# Patient Record
Sex: Female | Born: 1935 | Race: White | Hispanic: No | Smoking: Former smoker
Health system: Southern US, Community
[De-identification: ages and names within clinical notes are randomized; demographics above are authoritative.]

## PROBLEM LIST (undated history)

## (undated) DIAGNOSIS — G43909 Migraine, unspecified, not intractable, without status migrainosus: Secondary | ICD-10-CM

## (undated) DIAGNOSIS — K5792 Diverticulitis of intestine, part unspecified, without perforation or abscess without bleeding: Secondary | ICD-10-CM

## (undated) DIAGNOSIS — K219 Gastro-esophageal reflux disease without esophagitis: Secondary | ICD-10-CM

## (undated) DIAGNOSIS — I639 Cerebral infarction, unspecified: Secondary | ICD-10-CM

## (undated) DIAGNOSIS — R011 Cardiac murmur, unspecified: Secondary | ICD-10-CM

## (undated) DIAGNOSIS — E7800 Pure hypercholesterolemia, unspecified: Secondary | ICD-10-CM

## (undated) DIAGNOSIS — H532 Diplopia: Secondary | ICD-10-CM

## (undated) DIAGNOSIS — IMO0002 Reserved for concepts with insufficient information to code with codable children: Secondary | ICD-10-CM

## (undated) DIAGNOSIS — I10 Essential (primary) hypertension: Secondary | ICD-10-CM

## (undated) DIAGNOSIS — J40 Bronchitis, not specified as acute or chronic: Secondary | ICD-10-CM

## (undated) DIAGNOSIS — M1990 Unspecified osteoarthritis, unspecified site: Secondary | ICD-10-CM

## (undated) DIAGNOSIS — D759 Disease of blood and blood-forming organs, unspecified: Secondary | ICD-10-CM

## (undated) HISTORY — PX: JOINT REPLACEMENT: SHX530

## (undated) HISTORY — PX: TONSILLECTOMY: SUR1361

## (undated) HISTORY — PX: CARPAL TUNNEL RELEASE: SHX101

---

## 1996-11-28 HISTORY — PX: TOTAL HIP ARTHROPLASTY: SHX124

## 1998-10-30 ENCOUNTER — Ambulatory Visit (HOSPITAL_COMMUNITY): Admission: RE | Admit: 1998-10-30 | Discharge: 1998-10-30 | Payer: Self-pay | Admitting: Obstetrics & Gynecology

## 1998-11-05 ENCOUNTER — Ambulatory Visit (HOSPITAL_COMMUNITY): Admission: RE | Admit: 1998-11-05 | Discharge: 1998-11-05 | Payer: Self-pay | Admitting: Endocrinology

## 1999-11-05 ENCOUNTER — Ambulatory Visit (HOSPITAL_COMMUNITY): Admission: RE | Admit: 1999-11-05 | Discharge: 1999-11-05 | Payer: Self-pay | Admitting: *Deleted

## 1999-11-15 ENCOUNTER — Ambulatory Visit (HOSPITAL_COMMUNITY): Admission: RE | Admit: 1999-11-15 | Discharge: 1999-11-15 | Payer: Self-pay | Admitting: Obstetrics & Gynecology

## 1999-11-15 ENCOUNTER — Encounter: Payer: Self-pay | Admitting: *Deleted

## 1999-11-18 ENCOUNTER — Ambulatory Visit (HOSPITAL_COMMUNITY): Admission: RE | Admit: 1999-11-18 | Discharge: 1999-11-18 | Payer: Self-pay | Admitting: *Deleted

## 1999-11-18 ENCOUNTER — Encounter: Payer: Self-pay | Admitting: *Deleted

## 2000-11-03 ENCOUNTER — Ambulatory Visit (HOSPITAL_COMMUNITY): Admission: RE | Admit: 2000-11-03 | Discharge: 2000-11-03 | Payer: Self-pay | Admitting: Obstetrics & Gynecology

## 2000-11-23 ENCOUNTER — Ambulatory Visit (HOSPITAL_COMMUNITY): Admission: RE | Admit: 2000-11-23 | Discharge: 2000-11-23 | Payer: Self-pay | Admitting: Obstetrics & Gynecology

## 2001-11-28 HISTORY — PX: CATARACT EXTRACTION W/ INTRAOCULAR LENS IMPLANT: SHX1309

## 2002-11-28 DIAGNOSIS — K5792 Diverticulitis of intestine, part unspecified, without perforation or abscess without bleeding: Secondary | ICD-10-CM

## 2002-11-28 HISTORY — DX: Diverticulitis of intestine, part unspecified, without perforation or abscess without bleeding: K57.92

## 2003-11-29 HISTORY — PX: TOTAL KNEE ARTHROPLASTY: SHX125

## 2004-01-05 ENCOUNTER — Other Ambulatory Visit: Payer: Self-pay

## 2004-11-28 HISTORY — PX: CATARACT EXTRACTION W/ INTRAOCULAR LENS IMPLANT: SHX1309

## 2005-07-18 ENCOUNTER — Ambulatory Visit: Payer: Self-pay | Admitting: Ophthalmology

## 2005-07-25 ENCOUNTER — Ambulatory Visit: Payer: Self-pay | Admitting: Ophthalmology

## 2006-09-24 ENCOUNTER — Emergency Department: Payer: Self-pay | Admitting: Emergency Medicine

## 2008-01-11 ENCOUNTER — Ambulatory Visit: Payer: Self-pay | Admitting: Endocrinology

## 2010-11-28 DIAGNOSIS — H532 Diplopia: Secondary | ICD-10-CM

## 2010-11-28 HISTORY — DX: Diplopia: H53.2

## 2011-02-21 ENCOUNTER — Emergency Department: Payer: Self-pay | Admitting: Emergency Medicine

## 2013-11-28 DIAGNOSIS — J40 Bronchitis, not specified as acute or chronic: Secondary | ICD-10-CM

## 2013-11-28 HISTORY — DX: Bronchitis, not specified as acute or chronic: J40

## 2014-07-16 ENCOUNTER — Other Ambulatory Visit: Payer: Self-pay | Admitting: Orthopedic Surgery

## 2014-07-16 DIAGNOSIS — M25511 Pain in right shoulder: Secondary | ICD-10-CM

## 2014-07-18 ENCOUNTER — Ambulatory Visit
Admission: RE | Admit: 2014-07-18 | Discharge: 2014-07-18 | Disposition: A | Discharge status: Home / Self Care | Payer: Medicare Other | Source: Ambulatory Visit | Attending: Orthopedic Surgery | Admitting: Orthopedic Surgery

## 2014-07-18 DIAGNOSIS — M25511 Pain in right shoulder: Secondary | ICD-10-CM

## 2014-07-22 ENCOUNTER — Encounter (HOSPITAL_COMMUNITY): Payer: Self-pay

## 2014-07-24 NOTE — Pre-Procedure Instructions (Signed)
Debbie Foster  07/24/2014   Your procedure is scheduled on:  Tues, Sept 8 @ 7:30 AM  Report to [facility] at 5:30 AM.  Call this number if you have problems the morning of surgery: [phone]   Remember:   Do not eat food or drink liquids after midnight.   Take these medicines the morning of surgery with A SIP OF WATER: Metoprolol(Toprol)              Stop taking your Plavix and Aspirin as you have been instructed. Also stop taking your Fish Oil. No Goody's,BC's,Aleve,Ibuprofen,or any Herbal Medications   Do not wear jewelry, make-up or nail polish.  Do not wear lotions, powders, or perfumes.   Do not shave 48 hours prior to surgery.   Do not bring valuables to the hospital.  [facility] is not responsible                  for any belongings or valuables.               Contacts, dentures or bridgework may not be worn into surgery.  Leave suitcase in the car. After surgery it may be brought to your room.  For patients admitted to the hospital, discharge time is determined by your                treatment team.               Special Instructions:  [facility] - Preparing for Surgery  Before surgery, you can play an important role.  Because skin is not sterile, your skin needs to be as free of germs as possible.  You can reduce the number of germs on you skin by washing with CHG (chlorahexidine gluconate) soap before surgery.  CHG is an antiseptic cleaner which kills germs and bonds with the skin to continue killing germs even after washing.  Please DO NOT use if you have an allergy to CHG or antibacterial soaps.  If your skin becomes reddened/irritated stop using the CHG and inform your nurse when you arrive at Short Stay.  Do not shave (including legs and underarms) for at least 48 hours prior to the first CHG shower.  You may shave your face.  Please follow these instructions carefully:   1.  Shower with CHG Soap the night before surgery and the                                 morning of Surgery.  2.  If you choose to wash your hair, wash your hair first as usual with your       normal shampoo.  3.  After you shampoo, rinse your hair and body thoroughly to remove the                      Shampoo.  4.  Use CHG as you would any other liquid soap.  You can apply chg directly       to the skin and wash gently with scrungie or a clean washcloth.  5.  Apply the CHG Soap to your body ONLY FROM THE NECK DOWN.        Do not use on open wounds or open sores.  Avoid contact with your eyes,       ears, mouth and genitals (private parts).  Wash genitals (private parts)  with your normal soap.  6.  Wash thoroughly, paying special attention to the area where your surgery        will be performed.  7.  Thoroughly rinse your body with warm water from the neck down.  8.  DO NOT shower/wash with your normal soap after using and rinsing off       the CHG Soap.  9.  Pat yourself dry with a clean towel.            10.  Wear clean pajamas.            11.  Place clean sheets on your bed the night of your first shower and do not        sleep with pets.  Day of Surgery  Do not apply any lotions/deoderants the morning of surgery.  Please wear clean clothes to the hospital/surgery center.     Please read over the following fact sheets that you were given: Pain Booklet, Coughing and Deep Breathing, Blood Transfusion Information and Surgical Site Infection Prevention

## 2014-07-25 ENCOUNTER — Encounter (HOSPITAL_COMMUNITY)
Admission: RE | Admit: 2014-07-25 | Discharge: 2014-07-25 | Disposition: A | Discharge status: Home / Self Care | Payer: Medicare Other | Source: Ambulatory Visit | Attending: Orthopedic Surgery | Admitting: Orthopedic Surgery

## 2014-07-25 ENCOUNTER — Encounter (HOSPITAL_COMMUNITY): Payer: Self-pay

## 2014-07-25 DIAGNOSIS — M719 Bursopathy, unspecified: Secondary | ICD-10-CM | POA: Diagnosis present

## 2014-07-25 DIAGNOSIS — M19019 Primary osteoarthritis, unspecified shoulder: Secondary | ICD-10-CM | POA: Insufficient documentation

## 2014-07-25 DIAGNOSIS — Z01818 Encounter for other preprocedural examination: Secondary | ICD-10-CM | POA: Insufficient documentation

## 2014-07-25 DIAGNOSIS — M67919 Unspecified disorder of synovium and tendon, unspecified shoulder: Secondary | ICD-10-CM | POA: Insufficient documentation

## 2014-07-25 HISTORY — DX: Cardiac murmur, unspecified: R01.1

## 2014-07-25 HISTORY — DX: Gastro-esophageal reflux disease without esophagitis: K21.9

## 2014-07-25 HISTORY — DX: Essential (primary) hypertension: I10

## 2014-07-25 HISTORY — DX: Cerebral infarction, unspecified: I63.9

## 2014-07-25 HISTORY — DX: Disease of blood and blood-forming organs, unspecified: D75.9

## 2014-07-25 HISTORY — DX: Unspecified osteoarthritis, unspecified site: M19.90

## 2014-07-25 HISTORY — DX: Bronchitis, not specified as acute or chronic: J40

## 2014-07-25 HISTORY — DX: Diverticulitis of intestine, part unspecified, without perforation or abscess without bleeding: K57.92

## 2014-07-25 LAB — COMPREHENSIVE METABOLIC PANEL
ALBUMIN: 4.3 g/dL (ref 3.5–5.2)
ALK PHOS: 74 U/L (ref 39–117)
ALT: 21 U/L (ref 0–35)
AST: 26 U/L (ref 0–37)
Anion gap: 16 — ABNORMAL HIGH (ref 5–15)
BUN: 24 mg/dL — AB (ref 6–23)
CO2: 22 mEq/L (ref 19–32)
CREATININE: 0.94 mg/dL (ref 0.50–1.10)
Calcium: 10.9 mg/dL — ABNORMAL HIGH (ref 8.4–10.5)
Chloride: 94 mEq/L — ABNORMAL LOW (ref 96–112)
GFR calc non Af Amer: 53 mL/min — ABNORMAL LOW (ref >90)
GFR, EST AFRICAN AMERICAN: 61 mL/min — AB (ref >90)
GLUCOSE: 107 mg/dL — AB (ref 70–99)
POTASSIUM: 4.1 meq/L (ref 3.7–5.3)
Sodium: 132 mEq/L — ABNORMAL LOW (ref 137–147)
Total Bilirubin: 0.3 mg/dL (ref 0.3–1.2)
Total Protein: 8.1 g/dL (ref 6.0–8.3)

## 2014-07-25 LAB — APTT: aPTT: 32 seconds (ref 24–37)

## 2014-07-25 LAB — URINALYSIS, ROUTINE W REFLEX MICROSCOPIC
BILIRUBIN URINE: NEGATIVE
GLUCOSE, UA: NEGATIVE mg/dL
Hgb urine dipstick: NEGATIVE
Ketones, ur: NEGATIVE mg/dL
Nitrite: NEGATIVE
Protein, ur: NEGATIVE mg/dL
SPECIFIC GRAVITY, URINE: 1.012 (ref 1.005–1.030)
Urobilinogen, UA: 0.2 mg/dL (ref 0.0–1.0)
pH: 5.5 (ref 5.0–8.0)

## 2014-07-25 LAB — CBC WITH DIFFERENTIAL/PLATELET
BASOS PCT: 1 % (ref 0–1)
Basophils Absolute: 0.1 10*3/uL (ref 0.0–0.1)
EOS ABS: 0.3 10*3/uL (ref 0.0–0.7)
Eosinophils Relative: 2 % (ref 0–5)
HEMATOCRIT: 40.1 % (ref 36.0–46.0)
HEMOGLOBIN: 13.4 g/dL (ref 12.0–15.0)
LYMPHS ABS: 2.7 10*3/uL (ref 0.7–4.0)
Lymphocytes Relative: 24 % (ref 12–46)
MCH: 29.8 pg (ref 26.0–34.0)
MCHC: 33.4 g/dL (ref 30.0–36.0)
MCV: 89.3 fL (ref 78.0–100.0)
MONO ABS: 1 10*3/uL (ref 0.1–1.0)
MONOS PCT: 9 % (ref 3–12)
NEUTROS PCT: 64 % (ref 43–77)
Neutro Abs: 7.2 10*3/uL (ref 1.7–7.7)
Platelets: 295 10*3/uL (ref 150–400)
RBC: 4.49 MIL/uL (ref 3.87–5.11)
RDW: 12.8 % (ref 11.5–15.5)
WBC: 11.2 10*3/uL — ABNORMAL HIGH (ref 4.0–10.5)

## 2014-07-25 LAB — TYPE AND SCREEN
ABO/RH(D): B POS
Antibody Screen: NEGATIVE

## 2014-07-25 LAB — PROTIME-INR
INR: 1.02 (ref 0.00–1.49)
Prothrombin Time: 13.4 seconds (ref 11.6–15.2)

## 2014-07-25 LAB — URINE MICROSCOPIC-ADD ON

## 2014-07-25 LAB — ABO/RH: ABO/RH(D): B POS

## 2014-07-25 NOTE — Progress Notes (Signed)
Spoke to [place] at Dr Veda Canning office concerning surgical clearance, she stated that patient has an appointment 07/28/14 for clearance and to address Plavix. Darl Pikes will fax to [facility] for anesthesia.

## 2014-07-25 NOTE — Pre-Procedure Instructions (Signed)
Debbie Foster  07/25/2014   Your procedure is scheduled on:  Tues, Sept 8 @ 7:30 AM  Report to [facility] at 5:30 AM.  Call this number if you have problems the morning of surgery: [phone]   Remember:   Do not eat food or drink liquids after midnight.   Take these medicines the morning of surgery with A SIP OF WATER: Metoprolol(Toprol)              Stop taking your Plavix and Aspirin as you have been instructed. Also stop taking your Fish Oil. No Goody's,BC's,Aleve,Ibuprofen,or any Herbal Medications  MD will address Plavix on 07/28/14 and give clearance for surgery   Do not wear jewelry, make-up or nail polish.  Do not wear lotions, powders, or perfumes.   Do not shave 48 hours prior to surgery.   Do not bring valuables to the hospital.  [facility] is not responsible                  for any belongings or valuables.               Contacts, dentures or bridgework may not be worn into surgery.  Leave suitcase in the car. After surgery it may be brought to your room.  For patients admitted to the hospital, discharge time is determined by your                treatment team.               Special Instructions:  [facility] - Preparing for Surgery  Before surgery, you can play an important role.  Because skin is not sterile, your skin needs to be as free of germs as possible.  You can reduce the number of germs on you skin by washing with CHG (chlorahexidine gluconate) soap before surgery.  CHG is an antiseptic cleaner which kills germs and bonds with the skin to continue killing germs even after washing.  Please DO NOT use if you have an allergy to CHG or antibacterial soaps.  If your skin becomes reddened/irritated stop using the CHG and inform your nurse when you arrive at Short Stay.  Do not shave (including legs and underarms) for at least 48 hours prior to the first CHG shower.  You may shave your face.  Please follow these instructions carefully:   1.  Shower with  CHG Soap the night before surgery and the                                morning of Surgery.  2.  If you choose to wash your hair, wash your hair first as usual with your       normal shampoo.  3.  After you shampoo, rinse your hair and body thoroughly to remove the                      Shampoo.  4.  Use CHG as you would any other liquid soap.  You can apply chg directly       to the skin and wash gently with scrungie or a clean washcloth.  5.  Apply the CHG Soap to your body ONLY FROM THE NECK DOWN.        Do not use on open wounds or open sores.  Avoid contact with your eyes,       ears,  mouth and genitals (private parts).  Wash genitals (private parts)       with your normal soap.  6.  Wash thoroughly, paying special attention to the area where your surgery        will be performed.  7.  Thoroughly rinse your body with warm water from the neck down.  8.  DO NOT shower/wash with your normal soap after using and rinsing off       the CHG Soap.  9.  Pat yourself dry with a clean towel.            10.  Wear clean pajamas.            11.  Place clean sheets on your bed the night of your first shower and do not        sleep with pets.  Day of Surgery  Do not apply any lotions/deoderants the morning of surgery.  Please wear clean clothes to the hospital/surgery center.     Please read over the following fact sheets that you were given: Pain Booklet, Coughing and Deep Breathing, Blood Transfusion Information and Surgical Site Infection Prevention

## 2014-07-28 ENCOUNTER — Encounter (HOSPITAL_COMMUNITY): Payer: Self-pay

## 2014-07-28 NOTE — Progress Notes (Addendum)
Anesthesia Chart Review:  Pt is 80 year old female posted for R reverse total shoulder arthoplasty on 08/05/14 by Dr. Ave Filter.   PMH: HTN, heart murmur (unknown type), stroke, arthritis, GERD, blood dyscrasia, squamous cell cancer.   Medications include: plavix, olmesartan/amlodipine/hctz, metoprolol, ASA.   Preoperative labs reviewed.    Chest x-ray reviewed. Chronic emphysematous changes.  Nodular opacity in the right mid lung, possibly related to the anterior right 4th rib, although indeterminate. Consider CT chest for further evaluation  EKG: NSR, possible LA enlargement.   Pt getting clearance today, 07/28/14, for surgery and instructions for managing plavix perioperatively. Notes will be faxed here for inclusion in chart.   Carla in Dr. Veda Canning office notified of CXR results.   Rica Mast, FNP-BC [facility] Stay Surgical Center/Anesthesiology Phone: [phone] 07/28/2014 1:54 PM  Addendum:  I called and spoke with patient. She say Dr. Patrecia Pace with [facility] and [facility] today.  She reports she was told to hold her ASA and Plavix. She was told in the past that she has a murmur, but says Dr. Patrecia Pace never expressed any concern over it. He increased her Toprol XL to 50 mg BID.  She was unclear whether or not she was suppose to hold Tribenzor while her Toprol XL was increased, so she was going to call and clarify that with him in the morning.  I did tell her that she should not take Tribenzor on the morning of surgery.    I also received a clearance note from Dr. Patrecia Pace this afternoon. According to his note, myocardial perfusion study on 09/15/11 showed: No ischemia, EF 59%. (Update 07/29/2014 10:22 AM: I did speak with Marcelino Duster at Dr. Gregery Na office to confirm that this procedure is typically done with general anesthesia and nerve block at our hospital. I did this since his clearance note is preprinted to read "monitored anesthesia care."  I  gave her my office phone number if Dr. Patrecia Pace had any additional questions or comments.)  [facility] Short Stay Center/Anesthesiology Phone [phone] 07/28/2014 6:19 PM

## 2014-08-04 MED ORDER — CEFAZOLIN SODIUM-DEXTROSE 2-3 GM-% IV SOLR
2.0000 g | INTRAVENOUS | Status: AC
  Administered 2014-08-05: 2 g via INTRAVENOUS
  Filled 2014-08-04: qty 50

## 2014-08-04 MED ORDER — POVIDONE-IODINE 7.5 % EX SOLN
Freq: Once | CUTANEOUS | Status: DC
  Filled 2014-08-04: qty 118

## 2014-08-05 ENCOUNTER — Inpatient Hospital Stay (HOSPITAL_COMMUNITY)
Admission: RE | Admit: 2014-08-05 | Discharge: 2014-08-06 | DRG: 483 | Disposition: A | Discharge status: Home / Self Care | Payer: Medicare Other | Source: Ambulatory Visit | Attending: Orthopedic Surgery | Admitting: Orthopedic Surgery

## 2014-08-05 ENCOUNTER — Encounter (HOSPITAL_COMMUNITY): Payer: Self-pay | Admitting: *Deleted

## 2014-08-05 ENCOUNTER — Inpatient Hospital Stay (HOSPITAL_COMMUNITY): Payer: Medicare Other | Admitting: Anesthesiology

## 2014-08-05 ENCOUNTER — Inpatient Hospital Stay (HOSPITAL_COMMUNITY): Payer: Medicare Other

## 2014-08-05 ENCOUNTER — Encounter (HOSPITAL_COMMUNITY): Payer: Medicare Other | Admitting: Emergency Medicine

## 2014-08-05 ENCOUNTER — Encounter (HOSPITAL_COMMUNITY)
Admission: RE | Disposition: A | Discharge status: Home / Self Care | Payer: Self-pay | Source: Ambulatory Visit | Attending: Orthopedic Surgery

## 2014-08-05 DIAGNOSIS — M19019 Primary osteoarthritis, unspecified shoulder: Secondary | ICD-10-CM | POA: Diagnosis not present

## 2014-08-05 DIAGNOSIS — I10 Essential (primary) hypertension: Secondary | ICD-10-CM | POA: Diagnosis present

## 2014-08-05 DIAGNOSIS — M12811 Other specific arthropathies, not elsewhere classified, right shoulder: Secondary | ICD-10-CM

## 2014-08-05 DIAGNOSIS — Z8673 Personal history of transient ischemic attack (TIA), and cerebral infarction without residual deficits: Secondary | ICD-10-CM | POA: Diagnosis not present

## 2014-08-05 DIAGNOSIS — M25519 Pain in unspecified shoulder: Secondary | ICD-10-CM | POA: Diagnosis present

## 2014-08-05 DIAGNOSIS — K219 Gastro-esophageal reflux disease without esophagitis: Secondary | ICD-10-CM | POA: Diagnosis present

## 2014-08-05 DIAGNOSIS — F17200 Nicotine dependence, unspecified, uncomplicated: Secondary | ICD-10-CM | POA: Diagnosis present

## 2014-08-05 DIAGNOSIS — M12819 Other specific arthropathies, not elsewhere classified, unspecified shoulder: Secondary | ICD-10-CM | POA: Diagnosis present

## 2014-08-05 DIAGNOSIS — Z7982 Long term (current) use of aspirin: Secondary | ICD-10-CM | POA: Diagnosis not present

## 2014-08-05 HISTORY — DX: Diplopia: H53.2

## 2014-08-05 HISTORY — DX: Reserved for concepts with insufficient information to code with codable children: IMO0002

## 2014-08-05 HISTORY — DX: Migraine, unspecified, not intractable, without status migrainosus: G43.909

## 2014-08-05 HISTORY — DX: Pure hypercholesterolemia, unspecified: E78.00

## 2014-08-05 HISTORY — PX: REVERSE SHOULDER ARTHROPLASTY: SHX5054

## 2014-08-05 SURGERY — ARTHROPLASTY, SHOULDER, TOTAL, REVERSE
Anesthesia: General | Laterality: Right

## 2014-08-05 MED ORDER — ONDANSETRON HCL 4 MG/2ML IJ SOLN: 4.0000 mg | Freq: Four times a day (QID) | INTRAMUSCULAR | Status: DC | PRN

## 2014-08-05 MED ORDER — OXYCODONE-ACETAMINOPHEN 5-325 MG PO TABS: 1.0000 | ORAL_TABLET | ORAL | Status: DC | PRN

## 2014-08-05 MED ORDER — ASPIRIN EC 325 MG PO TBEC
325.0000 mg | DELAYED_RELEASE_TABLET | Freq: Two times a day (BID) | ORAL | Status: DC
  Administered 2014-08-05 – 2014-08-06 (×2): 325 mg via ORAL
  Filled 2014-08-05 (×4): qty 1

## 2014-08-05 MED ORDER — ACETAMINOPHEN 650 MG RE SUPP: 650.0000 mg | Freq: Four times a day (QID) | RECTAL | Status: DC | PRN

## 2014-08-05 MED ORDER — GLYCOPYRROLATE 0.2 MG/ML IJ SOLN
INTRAMUSCULAR | Status: DC | PRN
  Administered 2014-08-05: 0.2 mg via INTRAVENOUS

## 2014-08-05 MED ORDER — HYDROMORPHONE HCL PF 1 MG/ML IJ SOLN: 0.2500 mg | INTRAMUSCULAR | Status: DC | PRN

## 2014-08-05 MED ORDER — PROPOFOL 10 MG/ML IV BOLUS
INTRAVENOUS | Status: AC
  Filled 2014-08-05: qty 20

## 2014-08-05 MED ORDER — IRBESARTAN 300 MG PO TABS
300.0000 mg | ORAL_TABLET | Freq: Every day | ORAL | Status: DC
  Administered 2014-08-05 – 2014-08-06 (×2): 300 mg via ORAL
  Filled 2014-08-05 (×2): qty 1

## 2014-08-05 MED ORDER — METOCLOPRAMIDE HCL 5 MG PO TABS
5.0000 mg | ORAL_TABLET | Freq: Three times a day (TID) | ORAL | Status: DC | PRN
  Filled 2014-08-05: qty 1

## 2014-08-05 MED ORDER — FENTANYL CITRATE 0.05 MG/ML IJ SOLN
INTRAMUSCULAR | Status: DC | PRN
  Administered 2014-08-05 (×2): 25 ug via INTRAVENOUS

## 2014-08-05 MED ORDER — NEOSTIGMINE METHYLSULFATE 10 MG/10ML IV SOLN
INTRAVENOUS | Status: DC | PRN
  Administered 2014-08-05: 2 mg via INTRAVENOUS

## 2014-08-05 MED ORDER — PHENYLEPHRINE HCL 10 MG/ML IJ SOLN
10.0000 mg | INTRAVENOUS | Status: DC | PRN
  Administered 2014-08-05: 50 ug/min via INTRAVENOUS

## 2014-08-05 MED ORDER — ROCURONIUM BROMIDE 100 MG/10ML IV SOLN
INTRAVENOUS | Status: DC | PRN
  Administered 2014-08-05: 30 mg via INTRAVENOUS

## 2014-08-05 MED ORDER — MIDAZOLAM HCL 2 MG/2ML IJ SOLN
INTRAMUSCULAR | Status: AC
  Filled 2014-08-05: qty 2

## 2014-08-05 MED ORDER — ONDANSETRON HCL 4 MG/2ML IJ SOLN
INTRAMUSCULAR | Status: AC
  Filled 2014-08-05: qty 2

## 2014-08-05 MED ORDER — LIDOCAINE HCL (CARDIAC) 20 MG/ML IV SOLN
INTRAVENOUS | Status: AC
  Filled 2014-08-05: qty 5

## 2014-08-05 MED ORDER — ROCURONIUM BROMIDE 50 MG/5ML IV SOLN
INTRAVENOUS | Status: AC
  Filled 2014-08-05: qty 1

## 2014-08-05 MED ORDER — OXYCODONE HCL 5 MG PO TABS: 5.0000 mg | ORAL_TABLET | ORAL | Status: DC | PRN

## 2014-08-05 MED ORDER — AMLODIPINE BESYLATE 10 MG PO TABS
10.0000 mg | ORAL_TABLET | Freq: Every day | ORAL | Status: DC
  Administered 2014-08-05 – 2014-08-06 (×2): 10 mg via ORAL
  Filled 2014-08-05 (×2): qty 1

## 2014-08-05 MED ORDER — PROMETHAZINE HCL 25 MG/ML IJ SOLN: 6.2500 mg | INTRAMUSCULAR | Status: DC | PRN

## 2014-08-05 MED ORDER — OXYCODONE HCL 5 MG/5ML PO SOLN: 5.0000 mg | Freq: Once | ORAL | Status: DC | PRN

## 2014-08-05 MED ORDER — ALUMINUM HYDROXIDE GEL 320 MG/5ML PO SUSP
15.0000 mL | ORAL | Status: DC | PRN
  Filled 2014-08-05: qty 30

## 2014-08-05 MED ORDER — ONDANSETRON HCL 4 MG/2ML IJ SOLN
INTRAMUSCULAR | Status: DC | PRN
  Administered 2014-08-05: 4 mg via INTRAVENOUS

## 2014-08-05 MED ORDER — ACETAMINOPHEN 325 MG PO TABS: 650.0000 mg | ORAL_TABLET | Freq: Four times a day (QID) | ORAL | Status: DC | PRN

## 2014-08-05 MED ORDER — FENTANYL CITRATE 0.05 MG/ML IJ SOLN
INTRAMUSCULAR | Status: AC
  Filled 2014-08-05: qty 5

## 2014-08-05 MED ORDER — SODIUM CHLORIDE 0.9 % IR SOLN
Status: DC | PRN
  Administered 2014-08-05: 3000 mL

## 2014-08-05 MED ORDER — OXYCODONE HCL 5 MG PO TABS: 5.0000 mg | ORAL_TABLET | Freq: Once | ORAL | Status: DC | PRN

## 2014-08-05 MED ORDER — BISACODYL 10 MG RE SUPP: 10.0000 mg | Freq: Every day | RECTAL | Status: DC | PRN

## 2014-08-05 MED ORDER — METOPROLOL SUCCINATE ER 50 MG PO TB24
50.0000 mg | ORAL_TABLET | Freq: Two times a day (BID) | ORAL | Status: DC
  Administered 2014-08-05 – 2014-08-06 (×2): 50 mg via ORAL
  Filled 2014-08-05 (×3): qty 1

## 2014-08-05 MED ORDER — SODIUM CHLORIDE 0.9 % IV SOLN
INTRAVENOUS | Status: DC
  Administered 2014-08-05: 21:00:00 via INTRAVENOUS

## 2014-08-05 MED ORDER — CEFAZOLIN SODIUM 1-5 GM-% IV SOLN
1.0000 g | Freq: Four times a day (QID) | INTRAVENOUS | Status: AC
  Administered 2014-08-05 – 2014-08-06 (×3): 1 g via INTRAVENOUS
  Filled 2014-08-05 (×4): qty 50

## 2014-08-05 MED ORDER — FLEET ENEMA 7-19 GM/118ML RE ENEM: 1.0000 | ENEMA | Freq: Once | RECTAL | Status: AC | PRN

## 2014-08-05 MED ORDER — OLMESARTAN-AMLODIPINE-HCTZ 40-10-25 MG PO TABS: 1.0000 | ORAL_TABLET | Freq: Every morning | ORAL | Status: DC

## 2014-08-05 MED ORDER — DIPHENHYDRAMINE HCL 12.5 MG/5ML PO ELIX: 12.5000 mg | ORAL_SOLUTION | ORAL | Status: DC | PRN

## 2014-08-05 MED ORDER — SIMVASTATIN 40 MG PO TABS: 40.0000 mg | ORAL_TABLET | Freq: Every day | ORAL | Status: DC

## 2014-08-05 MED ORDER — PROPOFOL 10 MG/ML IV BOLUS
INTRAVENOUS | Status: DC | PRN
  Administered 2014-08-05: 100 mg via INTRAVENOUS

## 2014-08-05 MED ORDER — METOCLOPRAMIDE HCL 5 MG/ML IJ SOLN: 5.0000 mg | Freq: Three times a day (TID) | INTRAMUSCULAR | Status: DC | PRN

## 2014-08-05 MED ORDER — DOCUSATE SODIUM 100 MG PO CAPS
100.0000 mg | ORAL_CAPSULE | Freq: Two times a day (BID) | ORAL | Status: DC
  Administered 2014-08-05 – 2014-08-06 (×3): 100 mg via ORAL
  Filled 2014-08-05 (×4): qty 1

## 2014-08-05 MED ORDER — ONDANSETRON HCL 4 MG PO TABS: 4.0000 mg | ORAL_TABLET | Freq: Four times a day (QID) | ORAL | Status: DC | PRN

## 2014-08-05 MED ORDER — HYDROCODONE-ACETAMINOPHEN 5-325 MG PO TABS
1.0000 | ORAL_TABLET | ORAL | Status: DC | PRN
  Administered 2014-08-05 (×2): 2 via ORAL
  Administered 2014-08-05: 1 via ORAL
  Administered 2014-08-06: 2 via ORAL
  Filled 2014-08-05: qty 1
  Filled 2014-08-05 (×3): qty 2

## 2014-08-05 MED ORDER — PHENOL 1.4 % MT LIQD: 1.0000 | OROMUCOSAL | Status: DC | PRN

## 2014-08-05 MED ORDER — LACTATED RINGERS IV SOLN
INTRAVENOUS | Status: DC | PRN
  Administered 2014-08-05 (×2): via INTRAVENOUS

## 2014-08-05 MED ORDER — MORPHINE SULFATE 2 MG/ML IJ SOLN
1.0000 mg | INTRAMUSCULAR | Status: DC | PRN
Start: 2014-08-05 — End: 2014-08-06

## 2014-08-05 MED ORDER — HYDROCHLOROTHIAZIDE 25 MG PO TABS
25.0000 mg | ORAL_TABLET | Freq: Every day | ORAL | Status: DC
  Administered 2014-08-05 – 2014-08-06 (×2): 25 mg via ORAL
  Filled 2014-08-05 (×2): qty 1

## 2014-08-05 MED ORDER — SODIUM CHLORIDE 0.9 % IR SOLN
Status: DC | PRN
  Administered 2014-08-05: 1000 mL

## 2014-08-05 MED ORDER — POLYETHYLENE GLYCOL 3350 17 G PO PACK: 17.0000 g | PACK | Freq: Every day | ORAL | Status: DC | PRN

## 2014-08-05 MED ORDER — MENTHOL 3 MG MT LOZG: 1.0000 | LOZENGE | OROMUCOSAL | Status: DC | PRN

## 2014-08-05 MED ORDER — ATORVASTATIN CALCIUM 20 MG PO TABS
20.0000 mg | ORAL_TABLET | Freq: Every day | ORAL | Status: DC
  Administered 2014-08-05: 20 mg via ORAL
  Filled 2014-08-05 (×2): qty 1

## 2014-08-05 SURGICAL SUPPLY — 73 items
BIT DRILL 170X2.5X (BIT) IMPLANT
BIT DRL 170X2.5X (BIT) ×1
BLADE SAW SAG 73X25 THK (BLADE) ×1
BLADE SAW SGTL 73X25 THK (BLADE) ×1 IMPLANT
BLADE SURG 15 STRL LF DISP TIS (BLADE) ×1 IMPLANT
BLADE SURG 15 STRL SS (BLADE) ×2
BOWL SMART MIX CTS (DISPOSABLE) IMPLANT
CAPT SHOULD DELTAXTEND CEM MOD ×1 IMPLANT
CHLORAPREP W/TINT 26ML (MISCELLANEOUS) ×2 IMPLANT
COVER SURGICAL LIGHT HANDLE (MISCELLANEOUS) ×2 IMPLANT
DRAPE INCISE IOBAN 66X45 STRL (DRAPES) ×4 IMPLANT
DRAPE SURG 17X23 STRL (DRAPES) ×2 IMPLANT
DRAPE U-SHAPE 47X51 STRL (DRAPES) ×2 IMPLANT
DRILL 2.5 (BIT) ×2
DRILL BIT 5/64 (BIT) ×2 IMPLANT
DRSG AQUACEL AG ADV 3.5X10 (GAUZE/BANDAGES/DRESSINGS) ×1 IMPLANT
ELECT BLADE 4.0 EZ CLEAN MEGAD (MISCELLANEOUS) ×2
ELECT REM PT RETURN 9FT ADLT (ELECTROSURGICAL) ×2
ELECTRODE BLDE 4.0 EZ CLN MEGD (MISCELLANEOUS) ×1 IMPLANT
ELECTRODE REM PT RTRN 9FT ADLT (ELECTROSURGICAL) ×1 IMPLANT
EVACUATOR 1/8 PVC DRAIN (DRAIN) ×1 IMPLANT
GLOVE BIO SURGEON STRL SZ7 (GLOVE) ×3 IMPLANT
GLOVE BIO SURGEON STRL SZ7.5 (GLOVE) ×3 IMPLANT
GLOVE BIOGEL PI IND STRL 7.0 (GLOVE) ×1 IMPLANT
GLOVE BIOGEL PI IND STRL 8 (GLOVE) ×1 IMPLANT
GLOVE BIOGEL PI INDICATOR 7.0 (GLOVE) ×1
GLOVE BIOGEL PI INDICATOR 8 (GLOVE) ×1
GOWN STRL REUS W/ TWL LRG LVL3 (GOWN DISPOSABLE) ×3 IMPLANT
GOWN STRL REUS W/ TWL XL LVL3 (GOWN DISPOSABLE) ×1 IMPLANT
GOWN STRL REUS W/TWL LRG LVL3 (GOWN DISPOSABLE) ×6
GOWN STRL REUS W/TWL XL LVL3 (GOWN DISPOSABLE) ×2
HANDPIECE INTERPULSE COAX TIP (DISPOSABLE) ×2
HOOD PEEL AWAY FACE SHEILD DIS (HOOD) ×4 IMPLANT
KIT BASIN OR (CUSTOM PROCEDURE TRAY) ×2 IMPLANT
KIT ROOM TURNOVER OR (KITS) ×2 IMPLANT
MANIFOLD NEPTUNE II (INSTRUMENTS) ×2 IMPLANT
NDL HYPO 25GX1X1/2 BEV (NEEDLE) IMPLANT
NDL MAYO TROCAR (NEEDLE) IMPLANT
NEEDLE HYPO 25GX1X1/2 BEV (NEEDLE) ×2 IMPLANT
NEEDLE MAYO TROCAR (NEEDLE) ×2 IMPLANT
NOZZLE PRISM 8.5MM (MISCELLANEOUS) IMPLANT
NS IRRIG 1000ML POUR BTL (IV SOLUTION) ×2 IMPLANT
PACK SHOULDER (CUSTOM PROCEDURE TRAY) ×2 IMPLANT
PAD ARMBOARD 7.5X6 YLW CONV (MISCELLANEOUS) ×3 IMPLANT
PIN GUIDE 1.2 (PIN) ×1 IMPLANT
PIN GUIDE GLENOPHERE 1.5MX300M (PIN) ×1 IMPLANT
PIN METAGLENE 2.5 (PIN) ×1 IMPLANT
RETRIEVER SUT HEWSON (MISCELLANEOUS) ×2 IMPLANT
SET HNDPC FAN SPRY TIP SCT (DISPOSABLE) ×1 IMPLANT
SLING ARM IMMOBILIZER LRG (SOFTGOODS) ×1 IMPLANT
SLING ARM IMMOBILIZER MED (SOFTGOODS) IMPLANT
SPONGE LAP 18X18 X RAY DECT (DISPOSABLE) ×2 IMPLANT
SPONGE LAP 4X18 X RAY DECT (DISPOSABLE) IMPLANT
STRIP CLOSURE SKIN 1/2X4 (GAUZE/BANDAGES/DRESSINGS) ×1 IMPLANT
SUCTION FRAZIER TIP 10 FR DISP (SUCTIONS) ×2 IMPLANT
SUPPORT WRAP ARM LG (MISCELLANEOUS) ×2 IMPLANT
SUT ETHIBOND 2 OS 4 DA (SUTURE) ×4 IMPLANT
SUT ETHIBOND NAB CT1 #1 30IN (SUTURE) ×1 IMPLANT
SUT FIBERWIRE #2 38 T-5 BLUE (SUTURE)
SUT MNCRL AB 4-0 PS2 18 (SUTURE) ×1 IMPLANT
SUT SILK 2 0 TIES 17X18 (SUTURE)
SUT SILK 2-0 18XBRD TIE BLK (SUTURE) IMPLANT
SUT VIC AB 0 CTB1 27 (SUTURE) ×3 IMPLANT
SUT VIC AB 2-0 CT1 27 (SUTURE) ×2
SUT VIC AB 2-0 CT1 TAPERPNT 27 (SUTURE) ×1 IMPLANT
SUTURE FIBERWR #2 38 T-5 BLUE (SUTURE) IMPLANT
SYR CONTROL 10ML LL (SYRINGE) IMPLANT
SYRINGE TOOMEY DISP (SYRINGE) IMPLANT
TAPE STRIPS DRAPE STRL (GAUZE/BANDAGES/DRESSINGS) ×1 IMPLANT
TOWEL OR 17X24 6PK STRL BLUE (TOWEL DISPOSABLE) ×2 IMPLANT
TOWEL OR 17X26 10 PK STRL BLUE (TOWEL DISPOSABLE) ×2 IMPLANT
WATER STERILE IRR 1000ML POUR (IV SOLUTION) ×1 IMPLANT
YANKAUER SUCT BULB TIP NO VENT (SUCTIONS) ×2 IMPLANT

## 2014-08-05 NOTE — Anesthesia Preprocedure Evaluation (Addendum)
Anesthesia Evaluation  Patient identified by MRN, date of birth, ID band Patient awake    Reviewed: Allergy & Precautions, H&P , NPO status , Patient's Chart, lab work & pertinent test results  Airway Mallampati: III TM Distance: <3 FB Neck ROM: Full    Dental  (+) Dental Advisory Given   Pulmonary Current Smoker,  breath sounds clear to auscultation        Cardiovascular hypertension, Pt. on home beta blockers and Pt. on medications Rhythm:Regular Rate:Normal     Neuro/Psych CVA negative psych ROS   GI/Hepatic Neg liver ROS, GERD-  ,  Endo/Other  negative endocrine ROS  Renal/GU negative Renal ROS  negative genitourinary   Musculoskeletal  (+) Arthritis -,   Abdominal   Peds  Hematology negative hematology ROS (+)   Anesthesia Other Findings   Reproductive/Obstetrics negative OB ROS                          Anesthesia Physical Anesthesia Plan  ASA: III  Anesthesia Plan: General   Post-op Pain Management:    Induction: Intravenous  Airway Management Planned: Oral ETT  Additional Equipment: None  Intra-op Plan:   Post-operative Plan: Extubation in OR  Informed Consent: I have reviewed the patients History and Physical, chart, labs and discussed the procedure including the risks, benefits and alternatives for the proposed anesthesia with the patient or authorized representative who has indicated his/her understanding and acceptance.   Dental advisory given  Plan Discussed with: CRNA and Anesthesiologist  Anesthesia Plan Comments:         Anesthesia Quick Evaluation

## 2014-08-05 NOTE — Discharge Instructions (Signed)
Discharge Instructions after Reverse Total Shoulder Arthroplasty ° ° °A sling has been provided for you. You are to where this at all times, even while sleeping, until your first post operative visit with Dr. Chandler. °Use ice on the shoulder intermittently over the first 48 hours after surgery.  °Pain medicine has been prescribed for you.  °Use your medicine liberally over the first 48 hours, and then you can begin to taper your use. You may take Extra Strength Tylenol or Tylenol only in place of the pain pills. DO NOT take ANY nonsteroidal anti-inflammatory pain medications: Advil, Motrin, Ibuprofen, Aleve, Naproxen or Naprosyn.  °Take one aspirin a day for 2 weeks after surgery, unless you have an aspirin sensitivity/allergy or asthma.  °You may remove your dressing after two days  °You may shower 5 days after surgery. The incisions CANNOT get wet prior to 5 days. Simply allow the water to wash over the site and then pat dry. Do not rub the incisions. Make sure your axilla (armpit) is completely dry after showering. ° ° ° °Please call [phone] during normal business hours or [phone] after hours for any problems. Including the following: ° °- excessive redness of the incisions °- drainage for more than 4 days °- fever of more than 101.5 F ° °*Please note that pain medications will not be refilled after hours or on weekends. ° ° ° ° °

## 2014-08-05 NOTE — Anesthesia Procedure Notes (Signed)
Anesthesia Regional Block:  Interscalene brachial plexus block  Pre-Anesthetic Checklist: ,, timeout performed, Correct Patient, Correct Site, Correct Laterality, Correct Procedure, Correct Position, site marked, Risks and benefits discussed,  Surgical consent,  Pre-op evaluation,  At surgeon's request and post-op pain management  Laterality: Right  Prep: chloraprep       Needles:  Injection technique: Single-shot  Needle Type: Echogenic Needle     Needle Length: 5cm 5 cm Needle Gauge: 21 and 21 G    Additional Needles:  Procedures: ultrasound guided (picture in chart) Interscalene brachial plexus block Narrative:  Start time: 08/05/2014 7:05 AM End time: 08/05/2014 7:15 AM Injection made incrementally with aspirations every 5 mL.  Performed by: Personally  Anesthesiologist: Marcene Duos, MD  Additional Notes: Deltoid response noted at 0.40mA and disappeared at 0.68mA. Dosed with 25cc's 0.5% bupi with 1:200k epi.

## 2014-08-05 NOTE — Anesthesia Postprocedure Evaluation (Signed)
  Anesthesia Post-op Note  Patient: Debbie Foster  Procedure(s) Performed: Procedure(s) with comments: REVERSE SHOULDER ARTHROPLASTY (Right) - Right reverse total shoulder arthroplasty  Patient Location: PACU  Anesthesia Type:GA combined with regional for post-op pain  Level of Consciousness: awake, alert  and oriented  Airway and Oxygen Therapy: Patient Spontanous Breathing  Post-op Pain: none  Post-op Assessment: Post-op Vital signs reviewed  Post-op Vital Signs: Reviewed  Last Vitals:  Filed Vitals:   08/05/14 1119  BP: 132/38  Pulse: 52  Temp: 36.5 C  Resp: 18    Complications: No apparent anesthesia complications

## 2014-08-05 NOTE — H&P (Signed)
Debbie Foster is an 80 y.o. female.   Chief Complaint: R shoulder pain and dysfunction HPI: 80 yo F with severe endstage rotator cuff tear arthropathy, failed nonoperative treatment of activity modification, NSAIDs and injections.  Past Medical History  Diagnosis Date  . Hypertension   . Heart murmur   . Bronchitis   . GERD (gastroesophageal reflux disease)     h/o uses OTC  . Arthritis   . Blood dyscrasia   . Cancer     arms and face squamous cell CA  . Diverticulitis 2004  . Stroke     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    Past Surgical History  Procedure Laterality Date  . Joint replacement Left 1998  . Joint replacement Right 2005  . Eye surgery Right 2003    cataracts  . Eye surgery Left 2006    History reviewed. No pertinent family history. Social History:  reports that she has been smoking Cigarettes.  She has been smoking about 1.00 pack per day. She has never used smokeless tobacco. She reports that she does not drink alcohol or use illicit drugs.  Allergies: No Known Allergies  Medications Prior to Admission  Medication Sig Dispense Refill  . aspirin 81 MG tablet Take 81 mg by mouth daily.      . Cholecalciferol (VITAMIN D3) 1000 UNITS CAPS Take 1,000 Units by mouth daily.      . clopidogrel (PLAVIX) 75 MG tablet Take 75 mg by mouth daily.      Marland Kitchen co-enzyme Q-10 30 MG capsule Take 60 mg by mouth daily.      . metoprolol succinate (TOPROL-XL) 50 MG 24 hr tablet Take 50 mg by mouth 2 (two) times daily. Take with or immediately following a meal.      . Multiple Vitamins-Minerals (MULTIVITAMIN WITH MINERALS) tablet Take 1 tablet by mouth daily.      . Olmesartan-Amlodipine-HCTZ (TRIBENZOR) 40-10-25 MG TABS Take 1 tablet by mouth every morning.      . simvastatin (ZOCOR) 40 MG tablet Take 40 mg by mouth at bedtime.      . trolamine salicylate (ASPERCREME) 10 % cream Apply 1 application topically as needed for muscle pain.        No results found for this or any  previous visit (from the past 48 hour(s)). No results found.  Review of Systems  All other systems reviewed and are negative.   Blood pressure 142/50, pulse 57, temperature 97.7 F (36.5 C), temperature source Oral, resp. rate 16, height  (1.549 m), weight 52.6 kg (115 lb 15.4 oz), SpO2 99.00%. Physical Exam  Constitutional: She is oriented to person, place, and time. She appears well-developed and well-nourished.  HENT:  Head: Atraumatic.  Eyes: EOM are normal.  Cardiovascular: Intact distal pulses.   Respiratory: Effort normal.  Musculoskeletal:  R shoulder pain and crepitance with movement. NVID.  Neurological: She is alert and oriented to person, place, and time.  Skin: Skin is warm and dry.  Psychiatric: She has a normal mood and affect.     Assessment/Plan R endstage rotator cuff tear arthopathy Plan R reverse TSA Risks / benefits of surgery discussed Consent on chart  NPO for OR Preop antibiotics   Debbie Foster WILLIAM 08/05/2014, 7:16 AM

## 2014-08-05 NOTE — Op Note (Signed)
Procedure(s): REVERSE SHOULDER ARTHROPLASTY Procedure Note  Debbie Foster female 80 y.o. 08/05/2014  Procedure(s) and Anesthesia Type:    * RIGHT REVERSE SHOULDER ARTHROPLASTY - Choice   Indications:  80 y.o. female  With endstage right shoulder arthritis with irrepairable rotator cuff tear. Pain and dysfunction interfered with quality of life and nonoperative treatment with activity modification, NSAIDS and injections failed.     Surgeon: Mable Paris   Assistants: Damita Lack PA-C [facility] was present and scrubbed throughout the procedure and was essential in positioning, retraction, exposure, and closure)  Anesthesia: General endotracheal anesthesia with preoperative interscalene block    Procedure Detail  REVERSE SHOULDER ARTHROPLASTY   Estimated Blood Loss:  200 mL         Drains: 1 medium hemovac  Blood Given: none          Specimens: none        Complications:  * No complications entered in OR log *         Disposition: PACU - hemodynamically stable.         Condition: stable      OPERATIVE FINDINGS:  A DePuy press-fit reverse total shoulder arthroplasty was placed with a  size 10 stem, a 38 eccentric glenosphere, and a +6-mm poly insert. The base plate  fixation was excellent.  PROCEDURE: The patient was identified in the preoperative holding area  where I personally marked the operative site after verifying site, side,  and procedure with the patient. An interscalene block given by  the attending anesthesiologist in the holding area and the patient was taken back to the operating room where all extremities were  carefully padded in position after general anesthesia was induced. She  was placed in a beach-chair position and the operative upper extremity was  prepped and draped in a standard sterile fashion. An approximately 8-  cm incision was made from the tip of the coracoid process to the center  point of the humerus at the level of  the axilla. Dissection was carried  down through subcutaneous tissues to the level of the cephalic vein  which was taken laterally with the deltoid. The pectoralis major was  retracted medially. The subdeltoid space was developed and the lateral  edge of the conjoined tendon was identified. The undersurface of  conjoined tendon was palpated and the musculocutaneous nerve was not in  the field. Retractor was placed underneath the conjoined and second  retractor was placed lateral into the deltoid. The circumflex humeral  artery and vessels were identified and clamped and coagulated. The  biceps tendon was absent.  The subscapularis was severely degenerated with a significant amount of white crystalline appearing material.  subscapularis was taken down with the anterior capsule in a single layer. The tendon with significant crystalline deposits was excised.The  joint was then gently externally rotated while the capsule was released  from the humeral neck around to just beyond the 6 o'clock position. At  this point, the joint was dislocated and the humeral head was presented  into the wound. The excessive osteophyte formation was removed with a  large rongeur and the intramedullary canal was then entered with  sequential reamers up to a  10-mm reamer which was felt to be the  appropriate size. The cutting guide was used to make the appropriate  head cut and the head was saved potentially bone grafting.  the glenoid was exposed with the arm in an  abducted extended position. The anterior and posterior labrum  were  completely excised and the capsule was released circumferentially to  allow for exposure of the glenoid for preparation. The guidepin was  placed using the guide in neutral angulation and the reamer was  placed over the guidepin to ream down to concentric surface.  the anterior glenoid was preferentially reamed. The glenoid was noted to be significantly retroverted.  reamer was used as  well. The central drill hole was then made and  stayed within the glenoid vault. The Metaglene was then impacted with   excellent  press fit.  the posterior rim of the metaglen was noted to be in about 2 mm off of the reamed surface but the anterior superior and inferior surfaces were down nicely and the press-fit was solid. The superior and inferior screws were then  drilled, measured, and filled with appropriate-sized locking screw  alternatively tightening top and bottom to bring the Metaglene down  tightly against the bone. Posterior and anterior screws were then placed. Fixation was  excellent.   the 38 eccentric sphere was then placed and impacted with excellent fit. The humerus was then again exposed  and the metaphyseal reaming guide was placed. The 38 size 1 reamer was used.  The trial implant was then placed in the proximal humerus and the poly trials were tested and the above implant was felt to be the most appropriate soft tissue tensioning with excellent stability and  excellent range of motion. Therefore, final humeral stem was placed  press-fit .  And then the trial polyethylene inserts were tested again and the above implant was felt to be the most appropriate for final insertion. The joint was reduced taken through full range of motion and felt to be stable. Soft tissue tension was appropriate. The joint was then copiously irrigated with pulse  lavage and the wound was then closed. The subscapularis was not repaired.  Skin was closed with 2-0 Vicryl in a deep dermal layer and 4-0  Monocryl for skin closure. Steri-Strips were applied.terile  dressings were then applied as well as a sling. The patient was allowed  to awaken from general anesthesia, transferred to stretcher, and taken  to recovery room in stable condition.   POSTOPERATIVE PLAN: The patient will be kept in the hospital postoperatively  for pain control and therapy.

## 2014-08-05 NOTE — Progress Notes (Signed)
Utilization review completed.  

## 2014-08-05 NOTE — Transfer of Care (Signed)
Immediate Anesthesia Transfer of Care Note  Patient: Debbie Foster  Procedure(s) Performed: Procedure(s) with comments: REVERSE SHOULDER ARTHROPLASTY (Right) - Right reverse total shoulder arthroplasty  Patient Location: PACU  Anesthesia Type:General  Level of Consciousness: awake, alert  and oriented  Airway & Oxygen Therapy: Patient Spontanous Breathing and Patient connected to face mask oxygen  Post-op Assessment: Report given to PACU RN, Post -op Vital signs reviewed and stable and Patient moving all extremities  Post vital signs: Reviewed  Complications: No apparent anesthesia complications

## 2014-08-05 NOTE — Progress Notes (Signed)
Care of pt assumed by MA Arlyss Weathersby RN 

## 2014-08-05 NOTE — Progress Notes (Signed)
Report given to maryann sahver rn as caregiver 

## 2014-08-06 LAB — CBC
HCT: 29.9 % — ABNORMAL LOW (ref 36.0–46.0)
Hemoglobin: 9.9 g/dL — ABNORMAL LOW (ref 12.0–15.0)
MCH: 29.7 pg (ref 26.0–34.0)
MCHC: 33.1 g/dL (ref 30.0–36.0)
MCV: 89.8 fL (ref 78.0–100.0)
Platelets: 189 10*3/uL (ref 150–400)
RBC: 3.33 MIL/uL — AB (ref 3.87–5.11)
RDW: 12.8 % (ref 11.5–15.5)
WBC: 9.3 10*3/uL (ref 4.0–10.5)

## 2014-08-06 LAB — BASIC METABOLIC PANEL
ANION GAP: 13 (ref 5–15)
BUN: 23 mg/dL (ref 6–23)
CO2: 22 meq/L (ref 19–32)
Calcium: 8.8 mg/dL (ref 8.4–10.5)
Chloride: 95 mEq/L — ABNORMAL LOW (ref 96–112)
Creatinine, Ser: 0.97 mg/dL (ref 0.50–1.10)
GFR calc Af Amer: 59 mL/min — ABNORMAL LOW (ref >90)
GFR calc non Af Amer: 51 mL/min — ABNORMAL LOW (ref >90)
Glucose, Bld: 129 mg/dL — ABNORMAL HIGH (ref 70–99)
POTASSIUM: 4.1 meq/L (ref 3.7–5.3)
SODIUM: 130 meq/L — AB (ref 137–147)

## 2014-08-06 MED ORDER — DOCUSATE SODIUM 100 MG PO CAPS: 100.0000 mg | ORAL_CAPSULE | Freq: Three times a day (TID) | ORAL | Status: DC | PRN

## 2014-08-06 MED ORDER — HYDROCODONE-ACETAMINOPHEN 5-325 MG PO TABS: 1.0000 | ORAL_TABLET | Freq: Four times a day (QID) | ORAL | Status: DC | PRN

## 2014-08-06 NOTE — Evaluation (Signed)
Occupational Therapy Evaluation Patient Details Name: Debbie Foster MRN: [id number] DOB: 1925-09-08 Today's Date: 08/06/2014    History of Present Illness s/p reverse shoulder arthroplasty   Clinical Impression   Pt admitted with the above diagnoses and presents with below problem list. Pt will benefit from continued acute OT to address the below listed deficits and maximize independence with basic ADLs prior to d/c home with family. PTA pt was independent with ADLs. Pt at min A level for most ADLs. Pt lives with daughter and will have family assistance at d/c.  Pt needing 1 person hand-held assistance or use of wall to steady balance during ambulation. Requesting PT eval prior to d/c to further assess gait/ambulation.      Follow Up Recommendations  Supervision/Assistance - 24 hour    Equipment Recommendations  None recommended by OT (pt has recommended equipment)    Recommendations for Other Services       Precautions / Restrictions Precautions Precautions: Shoulder Type of Shoulder Precautions: NWB, sling on at all times except bathing/dressing/exercises Shoulder Interventions: Shoulder sling/immobilizer;Off for dressing/bathing/exercises Precaution Booklet Issued: Yes (comment) Precaution Comments: provided and reviewed shoulder protocal handout Required Braces or Orthoses: Sling Restrictions Weight Bearing Restrictions: Yes RUE Weight Bearing: Non weight bearing      Mobility Bed Mobility Overal bed mobility: Needs Assistance Bed Mobility: Supine to Sit;Sit to Supine     Supine to sit: Min guard;HOB elevated Sit to supine: Min guard   General bed mobility comments: cues for technique, no physical assistance needed  Transfers Overall transfer level: Needs assistance   Transfers: Sit to/from Stand Sit to Stand: Min guard         General transfer comment: performed sit<>stand at min guard level     Balance Overall balance assessment: Needs  assistance Sitting-balance support: Feet supported;No upper extremity supported Sitting balance-Leahy Scale: Fair     Standing balance support: Single extremity supported;During functional activity Standing balance-Leahy Scale: Poor Standing balance comment: Pt needing 1 person hand-held assist or use of wall to stabilize balance. Could be med related, recommending PT eval.                            ADL Overall ADL's : Needs assistance/impaired Eating/Feeding: Set up;Sitting   Grooming: Set up;Sitting   Upper Body Bathing: Minimal assitance;Sitting   Lower Body Bathing: Minimal assistance;Sit to/from stand   Upper Body Dressing : Minimal assistance;Sitting   Lower Body Dressing: Minimal assistance;Sit to/from stand   Toilet Transfer: Min guard   Toileting- Architect and Hygiene: Min guard   Tub/ Shower Transfer: Min guard   Functional mobility during ADLs: Minimal assistance;Min guard General ADL Comments: Educated pt on techniques and use of DME (3n1, grab bars) for safe completion of ADLs with shoulder precautions. Provided and reviewed shoulder protocal handout. Pt at min A level for most ADLs. Pt needing 1 person hand-held assistance or use of wall to steady balance during ambulation.     Vision                     Perception     Praxis      Pertinent Vitals/Pain Pain Assessment: 0-10 Pain Score: 4  Pain Location: R shoulder Pain Descriptors / Indicators: Aching;Constant Pain Intervention(s): Limited activity within patient's tolerance;Monitored during session;Utilized relaxation techniques;Repositioned     Hand Dominance Right   Extremity/Trunk Assessment Upper Extremity Assessment Upper Extremity Assessment: RUE deficits/detail RUE Deficits /  Details: s/p reverse shoulder arthoplasty; arthritis  RUE: Unable to fully assess due to immobilization RUE Coordination: decreased fine motor   Lower Extremity Assessment Lower  Extremity Assessment: Generalized weakness;Defer to PT evaluation       Communication Communication Communication: No difficulties   Cognition Arousal/Alertness: Awake/alert Behavior During Therapy: WFL for tasks assessed/performed Overall Cognitive Status: Within Functional Limits for tasks assessed                     General Comments       Exercises Exercises: Shoulder     Shoulder Instructions Shoulder Instructions Donning/doffing shirt without moving shoulder: Minimal assistance (educated) Method for sponge bathing under operated UE: Minimal assistance (educated) Donning/doffing sling/immobilizer: Minimal assistance (educated) Correct positioning of sling/immobilizer: Supervision/safety ROM for elbow, wrist and digits of operated UE: Minimal assistance;Min-guard (min A for elbow AROM) Sling wearing schedule (on at all times/off for ADL's): Supervision/safety Proper positioning of operated UE when showering: Supervision/safety Positioning of UE while sleeping: Supervision/safety    Home Living Family/patient expects to be discharged to:: Private residence Living Arrangements: Children Available Help at Discharge: Family;Other (Comment) (available day/night except 2-5 weekdays) Type of Home: House Home Access: Stairs to enter [employer] of Steps: 2 Entrance Stairs-Rails: Left Home Layout: One level     Bathroom Shower/Tub: Producer, television/film/video: Standard     Home Equipment: Environmental consultant - 2 wheels;Cane - quad;Cane - single point;Bedside commode;Grab bars - toilet;Grab bars - tub/shower          Prior Functioning/Environment Level of Independence: Independent        Comments: daughter lives with her    OT Diagnosis: Acute pain   OT Problem List: Impaired balance (sitting and/or standing);Decreased knowledge of precautions;Impaired UE functional use;Pain   OT Treatment/Interventions: Self-care/ADL training;Therapeutic exercise;DME  and/or AE instruction;Therapeutic activities;Patient/family education;Balance training    OT Goals(Current goals can be found in the care plan section) Acute Rehab OT Goals OT Goal Formulation: With patient Time For Goal Achievement: 08/13/14 Potential to Achieve Goals: Good ADL Goals Pt Will Perform Upper Body Bathing: with supervision;sitting Pt Will Perform Upper Body Dressing: with supervision;sitting Pt Will Perform Lower Body Dressing: with supervision;sit to/from stand Pt Will Transfer to Toilet: with supervision;ambulating;regular height toilet;grab bars Pt Will Perform Toileting - Clothing Manipulation and hygiene: with supervision;sit to/from stand Pt Will Perform Tub/Shower Transfer: with supervision;ambulating;shower seat;grab bars  OT Frequency: Min 2X/week   Barriers to D/C:            Co-evaluation              End of Session Equipment Utilized During Treatment: Gait belt;Other (comment) (sling) Nurse Communication: Mobility status;Other (comment) (needs waist strap for sling)  Activity Tolerance: Patient tolerated treatment well;Other (comment) (pt limited by dizziness/nausea OOB) Patient left: in bed;with call bell/phone within reach;with family/visitor present   Time: 1610-9604 OT Time Calculation (min): 57 min Charges:  OT General Charges $OT Visit: 1 Procedure OT Evaluation $Initial OT Evaluation Tier I: 1 Procedure OT Treatments $Self Care/Home Management : 23-37 mins $Therapeutic Exercise: 8-22 mins G-Codes:    Pilar Grammes 2014/08/09, 11:03 AM

## 2014-08-06 NOTE — Evaluation (Signed)
Physical Therapy Evaluation AND DISCHARGE Patient Details Name: Debbie Foster MRN: [id number] DOB: [birth year] Today's Date: 08/06/2014   History of Present Illness  s/p reverse shoulder arthroplasty  Clinical Impression  All safety education and general mobility assessment completed.  Pt has no immediate PT needs until MD orders rehab for the shoulder.  Pt has adequate assist at home.  Will sign off.    Follow Up Recommendations No PT follow up;Other (comment) (MD to order follow up PT/OT when appropriated)    Equipment Recommendations  None recommended by PT    Recommendations for Other Services       Precautions / Restrictions Precautions Precautions: Shoulder Type of Shoulder Precautions: NWB, sling on at all times except bathing/dressing/exercises Shoulder Interventions: Shoulder sling/immobilizer;Off for dressing/bathing/exercises Precaution Booklet Issued: Yes (comment) Precaution Comments: reviewed precautions with family, answered questions Required Braces or Orthoses: Sling Restrictions Weight Bearing Restrictions: Yes RUE Weight Bearing: Non weight bearing      Mobility  Bed Mobility Overal bed mobility: Needs Assistance Bed Mobility: Sidelying to Sit   Sidelying to sit: Supervision (HOB flat) Supine to sit: Min guard;HOB elevated Sit to supine: Min guard   General bed mobility comments: cues to make sure pt coming up via L elbow;  no assist needed  Transfers Overall transfer level: Needs assistance   Transfers: Sit to/from Stand Sit to Stand: Min guard (pt used back of legs for stability)         General transfer comment: cues for hand placement.  Ambulation/Gait Ambulation/Gait assistance: Min assist;Min guard Ambulation Distance (Feet): 150 Feet Assistive device: 1 person hand held assist;Quad cane Gait Pattern/deviations: Step-through pattern;Drifts right/left;Narrow base of support Gait velocity: slower and guarded   General Gait Details:  slow and mildly unsteady with/without assistive device.  Pt used quad cane correctly, but would do better with single point.  Believe she will quickly get back to amb without any device.  Stairs Stairs: Yes Stairs assistance: Min guard Stair Management: One rail Left;Alternating pattern;Forwards Number of Stairs: 3 General stair comments: steady with rail  Wheelchair Mobility    Modified Rankin (Stroke Patients Only)       Balance Overall balance assessment: Needs assistance Sitting-balance support: Feet supported;No upper extremity supported Sitting balance-Leahy Scale: Fair     Standing balance support: Single extremity supported;During functional activity Standing balance-Leahy Scale: Fair Standing balance comment: but can not accept challenge.                             Pertinent Vitals/Pain Pain Assessment: 0-10 Pain Score: 4  Pain Location: R shoulder Pain Descriptors / Indicators: Aching Pain Intervention(s): Repositioned    Home Living Family/patient expects to be discharged to:: Private residence Living Arrangements: Children Available Help at Discharge: Family;Other (Comment) Type of Home: House Home Access: Stairs to enter Entrance Stairs-Rails: Left Entrance Stairs-Number of Steps: 2 Home Layout: One level Home Equipment: Walker - 2 wheels;Cane - quad;Cane - single point;Bedside commode;Grab bars - toilet;Grab bars - tub/shower      Prior Function Level of Independence: Independent         Comments: daughter lives with her     Hand Dominance   Dominant Hand: Right    Extremity/Trunk Assessment   Upper Extremity Assessment: Defer to OT evaluation RUE Deficits / Details: s/p reverse shoulder arthoplasty; arthritis  RUE: Unable to fully assess due to immobilization       Lower Extremity Assessment: Overall [facility]  for tasks assessed         Communication   Communication: No difficulties  Cognition Arousal/Alertness:  Awake/alert Behavior During Therapy: WFL for tasks assessed/performed Overall Cognitive Status: Within Functional Limits for tasks assessed                      General Comments General comments (skin integrity, edema, etc.): Less dizziness, but still not confident up on her feet.    Exercises Shoulder Exercises Elbow Flexion: AROM;5 reps;Right;Standing;AAROM Elbow Extension: AROM;Right;5 reps;Standing;AAROM Wrist Flexion: AROM;5 reps;Right;Seated Wrist Extension: AROM;Right;5 reps;Seated Digit Composite Flexion: AROM;Right;5 reps;Seated Composite Extension: AROM;Right;5 reps;Seated Neck Flexion: AROM;5 reps;Seated Neck Extension: AROM;5 reps;Seated Neck Lateral Flexion - Right: AROM;5 reps;Seated Neck Lateral Flexion - Left: AROM;5 reps;Seated Donning/doffing shirt without moving shoulder: Minimal assistance (educated) Method for sponge bathing under operated UE: Minimal assistance (educated) Donning/doffing sling/immobilizer: Minimal assistance Correct positioning of sling/immobilizer: Supervision/safety ROM for elbow, wrist and digits of operated UE: Minimal assistance;Min-guard (min A for elbow AROM) Sling wearing schedule (on at all times/off for ADL's): Supervision/safety Proper positioning of operated UE when showering: Supervision/safety Positioning of UE while sleeping: Supervision/safety      Assessment/Plan    PT Assessment Patent does not need any further PT services (Will eventually need OP therapy services for shoulder rehab)  PT Diagnosis     PT Problem List    PT Treatment Interventions     PT Goals (Current goals can be found in the Care Plan section) Acute Rehab PT Goals PT Goal Formulation: No goals set, d/c therapy    Frequency     Barriers to discharge        Co-evaluation               End of Session   Activity Tolerance: Patient tolerated treatment well Patient left: in chair;with call bell/phone within reach;with family/visitor  present Nurse Communication: Mobility status         Time: 1610-9604 PT Time Calculation (min): 22 min   Charges:   PT Evaluation $Initial PT Evaluation Tier I: 1 Procedure PT Treatments $Gait Training: 8-22 mins   PT G Codes:          Noely Kuhnle, Eliseo Gum 08/06/2014, 12:07 PM 08/06/2014  Dunlap Bing, PT [phone]  (pager)

## 2014-08-06 NOTE — Progress Notes (Signed)
Orthopedic Tech Progress Note Patient Details:  Debbie Foster 11-05-1925 [id number]  Ortho Devices Type of Ortho Device: Sling immobilizer Ortho Device/Splint Interventions: Application   [name], [name] 08/06/2014, 1:41 PM

## 2014-08-06 NOTE — Progress Notes (Signed)
Occupational Therapy Treatment Patient Details Name: Debbie Foster MRN: [id number] DOB: January 31, 1925 Today's Date: 08/06/2014    History of present illness s/p reverse shoulder arthroplasty   OT comments  Pt seen for acute OT treatment session. Pt's daughters present during session. Answered questions related to sling and ADL education. Provided shoulder protocol handout to daughters. Repositioned sling with new waist strap and educated family on sling position and protocol.   Follow Up Recommendations  Supervision/Assistance - 24 hour    Equipment Recommendations  None recommended by OT    Recommendations for Other Services      Precautions / Restrictions Precautions Precautions: Shoulder Type of Shoulder Precautions: NWB, sling on at all times except bathing/dressing/exercises Shoulder Interventions: Shoulder sling/immobilizer;Off for dressing/bathing/exercises Precaution Booklet Issued: Yes (comment) Precaution Comments: reviewed precautions with family, answered questions Required Braces or Orthoses: Sling Restrictions Weight Bearing Restrictions: Yes RUE Weight Bearing: Non weight bearing       Mobility Bed Mobility  Transfers     Balance                 ADL                                                Vision                     Perception     Praxis      Cognition   Behavior During Therapy: WFL for tasks assessed/performed Overall Cognitive Status: Within Functional Limits for tasks assessed                       Extremity/Trunk Assessment  Upper Extremity Assessment Upper Extremity Assessment: Defer to OT evaluation   Lower Extremity Assessment Lower Extremity Assessment: Overall WFL for tasks assessed        Exercises Donning/doffing sling/immobilizer: Minimal assistance Correct positioning of sling/immobilizer: Supervision/safety Sling wearing schedule (on at all times/off for ADL's):  Supervision/safety Proper positioning of operated UE when showering: Supervision/safety Positioning of UE while sleeping: Supervision/safety   Shoulder Instructions Shoulder Instructions Donning/doffing sling/immobilizer: Minimal assistance Correct positioning of sling/immobilizer: Supervision/safety Sling wearing schedule (on at all times/off for ADL's): Supervision/safety Proper positioning of operated UE when showering: Supervision/safety Positioning of UE while sleeping: Supervision/safety     General Comments      Pertinent Vitals/ Pain       Pain Assessment: 0-10 Pain Score: 4  Pain Location: R shoulder Pain Descriptors / Indicators: Aching Pain Intervention(s): Repositioned  Home Living Family/patient expects to be discharged to:: Private residence Living Arrangements: Children Available Help at Discharge: Family;Other (Comment) Type of Home: House Home Access: Stairs to enter [employer] of Steps: 2 Entrance Stairs-Rails: Left Home Layout: One level               Home Equipment: Walker - 2 wheels;Cane - quad;Cane - single point;Bedside commode;Grab bars - toilet;Grab bars - tub/shower          Prior Functioning/Environment Level of Independence: Independent        Comments: daughter lives with her   Frequency Min 2X/week     Progress Toward Goals  OT Goals(current goals can now be found in the care plan section)  Progress towards OT goals: Progressing toward goals  Acute Rehab OT Goals OT Goal Formulation: With patient Time  For Goal Achievement: 08/13/14 Potential to Achieve Goals: Good ADL Goals Pt Will Perform Upper Body Bathing: with supervision;sitting Pt Will Perform Upper Body Dressing: with supervision;sitting Pt Will Perform Lower Body Dressing: with supervision;sit to/from stand Pt Will Transfer to Toilet: with supervision;ambulating;regular height toilet;grab bars Pt Will Perform Toileting - Clothing Manipulation and  hygiene: with supervision;sit to/from stand Pt Will Perform Tub/Shower Transfer: with supervision;ambulating;shower seat;grab bars  Plan Discharge plan remains appropriate    Co-evaluation                 End of Session     Activity Tolerance     Patient Left in bed;with call bell/phone within reach;with family/visitor present   Nurse Communication          Time: 1610-9604 OT Time Calculation (min): 13 min  Charges: OT General Charges $OT Visit: 1 Procedure OT Treatments $Therapeutic Activity: 8-22 mins  Pilar Grammes 08/06/2014, 2:34 PM

## 2014-08-06 NOTE — Progress Notes (Signed)
Discharge instructions reviewed. All questions answered. Verbalizes understanding. Family will drive patient home.

## 2014-08-06 NOTE — Progress Notes (Signed)
   PATIENT ID: Debbie Foster   1 Day Post-Op Procedure(s) (LRB): REVERSE SHOULDER ARTHROPLASTY (Right)  Subjective: Reports doing well, minimal pain. Reports some numbness in hand from block still, but improving. No other complaints or concerns.   Objective:  Filed Vitals:   08/06/14 0500  BP: 113/40  Pulse: 60  Temp: 98.1 F (36.7 C)  Resp: 18     R UE dressing c/d/i Wiggles fingers, distally decreased sensation to light touch  Labs:   Recent Labs  08/06/14 0420  HGB 9.9*   Recent Labs  08/06/14 0420  WBC 9.3  RBC 3.33*  HCT 29.9*  PLT 189   Recent Labs  08/06/14 0420  NA 130*  K 4.1  CL 95*  CO2 22  BUN 23  CREATININE 0.97  GLUCOSE 129*  CALCIUM 8.8    Assessment and Plan: 1 day s/p right reverse TSA Effects of block still present Doing well with norco, continue to minimize pain rx Okay to d/c after OT today Sling, hand wrist elbow ROM only norco for home pain control, script signed in chart Follow up 2 weeks with Dr. Ave Filter  VTE proph: asa  BID, scds

## 2014-08-07 ENCOUNTER — Encounter (HOSPITAL_COMMUNITY): Payer: Self-pay | Admitting: Orthopedic Surgery

## 2014-08-08 NOTE — Discharge Summary (Signed)
Patient ID: Debbie Foster MRN: [id number] DOB/AGE: 1925-03-16 80 y.o.  Admit date: 08/05/2014 Discharge date: 08/08/2014  Admission Diagnoses:  Active Problems:   Rotator cuff arthropathy   Discharge Diagnoses:  Same  Past Medical History  Diagnosis Date  . Hypertension   . Heart murmur   . Bronchitis 11/2013    "first time I've ever had it; still coughing from it now" (08/05/2014)  . GERD (gastroesophageal reflux disease)     h/o uses OTC  . Blood dyscrasia   . Diverticulitis 2004  . High cholesterol   . Migraine     "stopped w/the change of life; age 88"  . Stroke     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  . Double vision 2012    "left eye; from the eye stroke; can see fine w/my glasses"  . Arthritis     "just about q bone in my body"  . Squamous carcinoma     arms and face    Surgeries: Procedure(s): REVERSE SHOULDER ARTHROPLASTY on 08/05/2014   Consultants:    Discharged Condition: Improved  Hospital Course: Debbie Foster is an 80 y.o. female who was admitted 08/05/2014 for operative treatment of right rotator cuff arthropathy. Patient has severe unremitting pain that affects sleep, daily activities, and work/hobbies. After pre-op clearance the patient was taken to the operating room on 08/05/2014 and underwent  Procedure(s): REVERSE SHOULDER ARTHROPLASTY.    Patient was given perioperative antibiotics: Anti-infectives   Start     Dose/Rate Route Frequency Ordered Stop   08/05/14 1400  ceFAZolin (ANCEF) IVPB 1 g/50 mL premix     1 g 100 mL/hr over 30 Minutes Intravenous Every 6 hours 08/05/14 1106 08/06/14 0254   08/05/14 0600  ceFAZolin (ANCEF) IVPB 2 g/50 mL premix     2 g 100 mL/hr over 30 Minutes Intravenous On call to O.R. 08/04/14 1341 08/05/14 0805       Patient was given sequential compression devices, early ambulation, and ASA325mg  BID to prevent DVT.  Patient benefited maximally from hospital stay and there were no complications.    Recent vital signs: No  data found.    Recent laboratory studies:  Recent Labs  08/06/14 0420  WBC 9.3  HGB 9.9*  HCT 29.9*  PLT 189  NA 130*  K 4.1  CL 95*  CO2 22  BUN 23  CREATININE 0.97  GLUCOSE 129*  CALCIUM 8.8     Discharge Medications:     Medication List         aspirin 81 MG tablet  Take 81 mg by mouth daily.     clopidogrel 75 MG tablet  Commonly known as:  PLAVIX  Take 75 mg by mouth daily.     co-enzyme Q-10 30 MG capsule  Take 60 mg by mouth daily.     docusate sodium 100 MG capsule  Commonly known as:  COLACE  Take 1 capsule (100 mg total) by mouth 3 (three) times daily as needed.     HYDROcodone-acetaminophen 5-325 MG per tablet  Commonly known as:  NORCO  Take 1 tablet by mouth every 6 (six) hours as needed for moderate pain.     metoprolol succinate 50 MG 24 hr tablet  Commonly known as:  TOPROL-XL  Take 50 mg by mouth daily.     multivitamin with minerals tablet  Take 1 tablet by mouth daily.     simvastatin 40 MG tablet  Commonly known as:  ZOCOR  Take  40 mg by mouth at bedtime.     TRIBENZOR 40-10-25 MG Tabs  Generic drug:  Olmesartan-Amlodipine-HCTZ  Take 1 tablet by mouth every morning.     trolamine salicylate 10 % cream  Commonly known as:  ASPERCREME  Apply 1 application topically as needed for muscle pain.     Vitamin D3 1000 UNITS Caps  Take 1,000 Units by mouth daily.        Diagnostic Studies: Dg Chest 2 View  07/25/2014   CLINICAL DATA:  Preop right shoulder arthroplasty  EXAM: CHEST  2 VIEW  COMPARISON:  None.  FINDINGS: Chronic interstitial markings/emphysematous changes.  Nodular opacity in the right mid lung, possibly related to the anterior right 4th rib, although indeterminate. No pleural effusion or pneumothorax.  The heart is normal in size.  S-shaped thoracolumbar scoliosis.  IMPRESSION: Nodular opacity in the right mid lung, possibly related to the anterior right 4th rib, although indeterminate.  Consider CT chest for further  evaluation.   Electronically Signed   By: Charline Bills M.D.   On: 07/25/2014 10:58   Ct Shoulder Right Wo Contrast  07/18/2014   CLINICAL DATA:  Right shoulder pain.  EXAM: CT OF THE RIGHT SHOULDER WITHOUT CONTRAST  TECHNIQUE: Multidetector CT imaging was performed according to the standard protocol. Multiplanar CT image reconstructions were also generated.  COMPARISON:  None.  FINDINGS: No fracture or dislocation is noted. Severe narrowing of right subacromial space is noted consistent with rotator cuff injury. Severe narrowing and osteophyte formation of right glenohumeral joint is also noted. Visualized ribs appear normal. Mild scarring is seen throughout the visualized lungs.  IMPRESSION: Severe degenerative joint disease of the right glenohumeral joint is noted. Severe narrowing of right subacromial space is noted consistent with rotator cuff injury. No acute fracture or dislocation is noted.   Electronically Signed   By: Roque Lias M.D.   On: 07/18/2014 18:47   Dg Shoulder Right Port  08/05/2014   CLINICAL DATA:  Post reverse total shoulder arthroplasty  EXAM: PORTABLE RIGHT SHOULDER - 2+ VIEW  COMPARISON:  Portable AP view 1028 hr compared CT RIGHT shoulder rate 12/17/2013  FINDINGS: Reverse RIGHT shoulder prosthesis identified.  No gross evidence of fracture or dislocation seen on single AP view ; no orthogonal view for correlation.  Diffuse osseous demineralization.  Chronic remodeling of the undersurface of the acromion due to chronic rotator cuff tear.  IMPRESSION: RIGHT shoulder prosthesis without apparent acute complication.   Electronically Signed   By: Ulyses Southward M.D.   On: 08/05/2014 11:46    Disposition: 01-Home or Self Care      Discharge Instructions   Call MD / Call 911    Complete by:  As directed   If you experience chest pain or shortness of breath, CALL 911 and be transported to the hospital emergency room.  If you develope a fever above 101 F, pus (white drainage) or  increased drainage or redness at the wound, or calf pain, call your surgeon's office.     Constipation Prevention    Complete by:  As directed   Drink plenty of fluids.  Prune juice may be helpful.  You may use a stool softener, such as Colace (over the counter) 100 mg twice a day.  Use MiraLax (over the counter) for constipation as needed.     Diet - low sodium heart healthy    Complete by:  As directed      Increase activity slowly as tolerated  Complete by:  As directed            Follow-up Information   Follow up with Mable Paris, MD. Schedule an appointment as soon as possible for a visit in 2 weeks.   Specialty:  Orthopedic Surgery   Contact information:   [address] [place] [address] [phone]        Signed: Jiles Harold 08/08/2014, 11:39 AM

## 2014-10-14 ENCOUNTER — Emergency Department: Payer: Self-pay | Admitting: Emergency Medicine

## 2015-04-12 IMAGING — CR DG SHOULDER 2+V PORT*R*
1 series · 1 of 1 positions shown · non-contrast
Comparison: Portable AP view 3226 hr compared CT RIGHT shoulder
rate 12/17/2013

CLINICAL DATA: Post reverse total shoulder arthroplasty

EXAM:
PORTABLE RIGHT SHOULDER - 2+ VIEW

[ap]
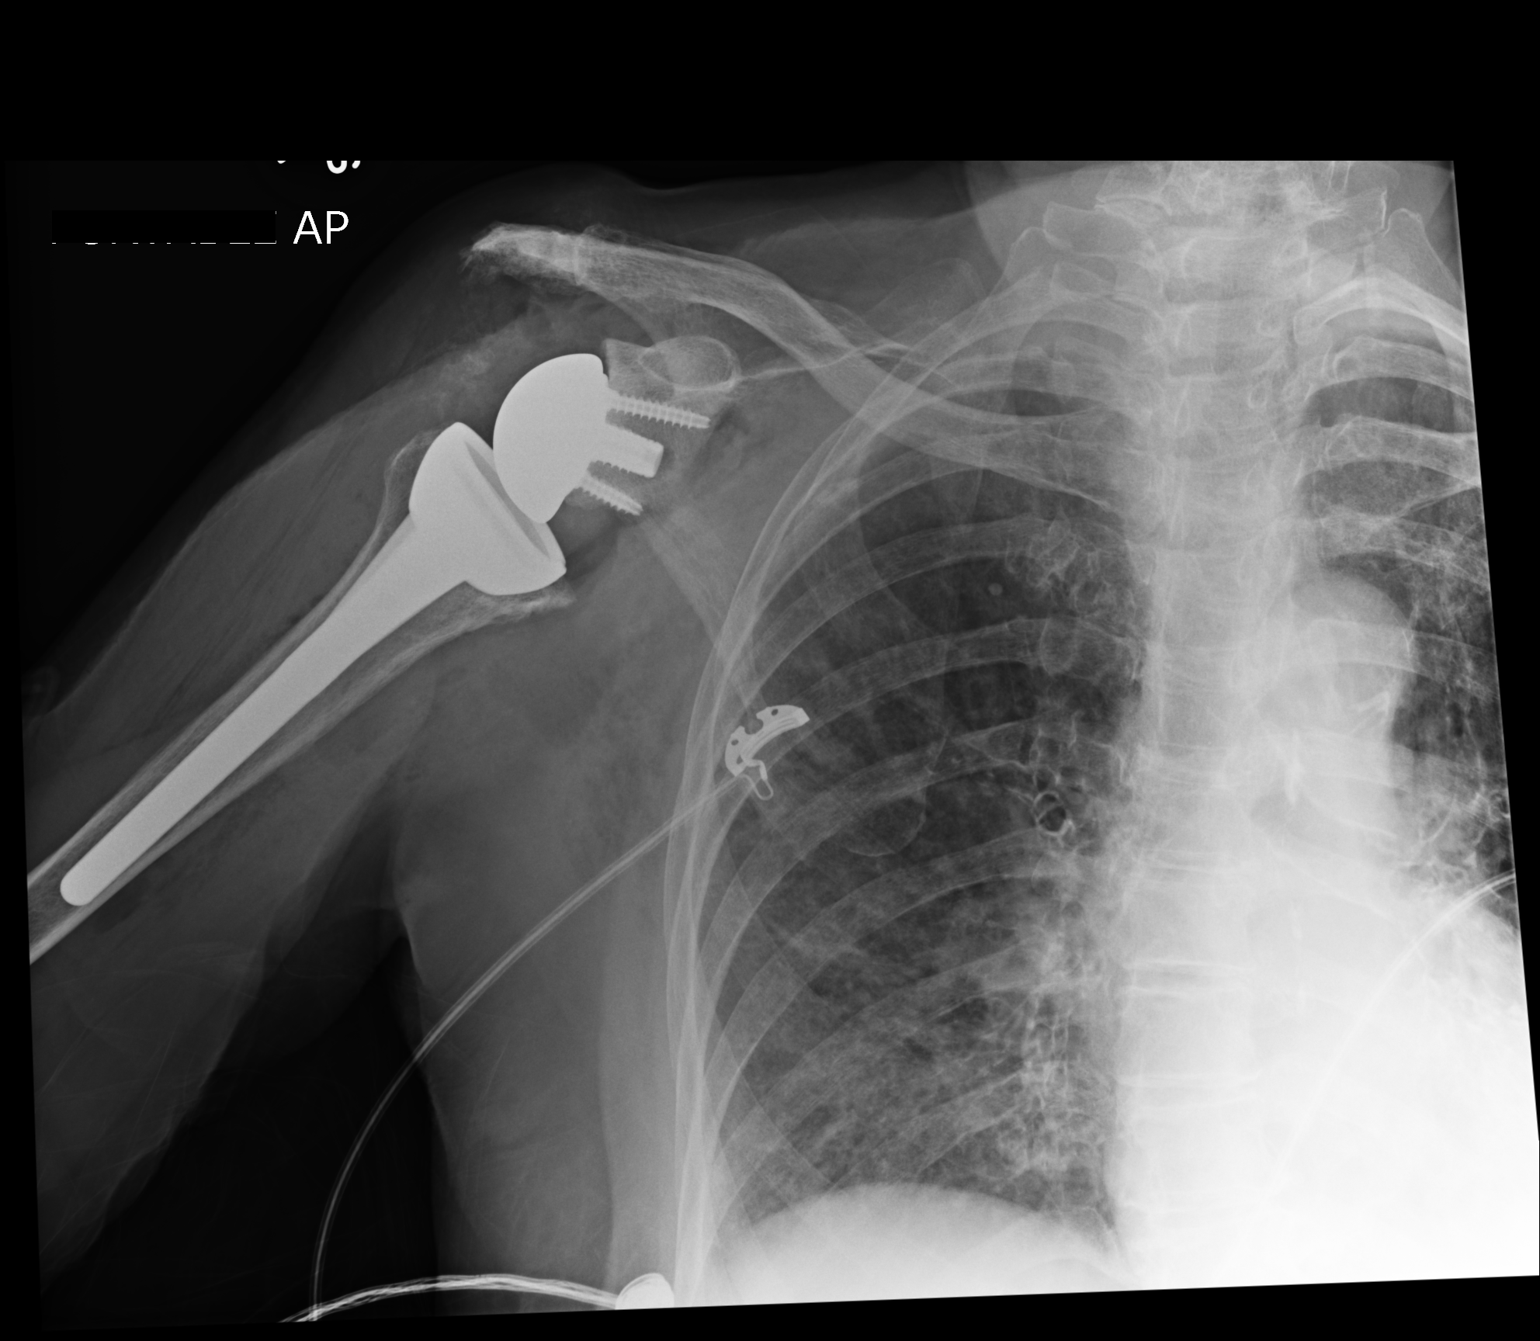

[1 of 1 positions shown; findings below may reference images not displayed]

FINDINGS: Reverse RIGHT shoulder prosthesis identified.

No gross evidence of fracture or dislocation seen on single AP view
; no orthogonal view for correlation.

Diffuse osseous demineralization.

Chronic remodeling of the undersurface of the acromion due to
chronic rotator cuff tear.
IMPRESSION: RIGHT shoulder prosthesis without apparent acute complication.

## 2015-11-25 ENCOUNTER — Encounter: Payer: Self-pay | Admitting: *Deleted

## 2016-03-22 ENCOUNTER — Emergency Department: Payer: Medicare Other

## 2016-03-22 ENCOUNTER — Encounter: Payer: Self-pay | Admitting: Medical Oncology

## 2016-03-22 ENCOUNTER — Emergency Department
Admission: EM | Admit: 2016-03-22 | Discharge: 2016-03-22 | Disposition: A | Discharge status: Home / Self Care | Payer: Medicare Other | Attending: Emergency Medicine | Admitting: Emergency Medicine

## 2016-03-22 DIAGNOSIS — Z87891 Personal history of nicotine dependence: Secondary | ICD-10-CM | POA: Insufficient documentation

## 2016-03-22 DIAGNOSIS — Y929 Unspecified place or not applicable: Secondary | ICD-10-CM | POA: Diagnosis not present

## 2016-03-22 DIAGNOSIS — S73005A Unspecified dislocation of left hip, initial encounter: Secondary | ICD-10-CM

## 2016-03-22 DIAGNOSIS — Y999 Unspecified external cause status: Secondary | ICD-10-CM | POA: Diagnosis not present

## 2016-03-22 DIAGNOSIS — Z79899 Other long term (current) drug therapy: Secondary | ICD-10-CM | POA: Diagnosis not present

## 2016-03-22 DIAGNOSIS — Z7982 Long term (current) use of aspirin: Secondary | ICD-10-CM | POA: Diagnosis not present

## 2016-03-22 DIAGNOSIS — X501XXA Overexertion from prolonged static or awkward postures, initial encounter: Secondary | ICD-10-CM | POA: Insufficient documentation

## 2016-03-22 DIAGNOSIS — Y9301 Activity, walking, marching and hiking: Secondary | ICD-10-CM | POA: Diagnosis not present

## 2016-03-22 DIAGNOSIS — Z85828 Personal history of other malignant neoplasm of skin: Secondary | ICD-10-CM | POA: Insufficient documentation

## 2016-03-22 DIAGNOSIS — M25552 Pain in left hip: Secondary | ICD-10-CM | POA: Diagnosis present

## 2016-03-22 DIAGNOSIS — T84021A Dislocation of internal left hip prosthesis, initial encounter: Secondary | ICD-10-CM | POA: Diagnosis not present

## 2016-03-22 DIAGNOSIS — Z8673 Personal history of transient ischemic attack (TIA), and cerebral infarction without residual deficits: Secondary | ICD-10-CM | POA: Diagnosis not present

## 2016-03-22 DIAGNOSIS — Y828 Other medical devices associated with adverse incidents: Secondary | ICD-10-CM | POA: Insufficient documentation

## 2016-03-22 DIAGNOSIS — I10 Essential (primary) hypertension: Secondary | ICD-10-CM | POA: Insufficient documentation

## 2016-03-22 MED ORDER — PROPOFOL 10 MG/ML IV BOLUS
0.5000 mg/kg | Freq: Once | INTRAVENOUS | Status: AC
  Administered 2016-03-22: 25.2 mg via INTRAVENOUS

## 2016-03-22 MED ORDER — PROPOFOL 1000 MG/100ML IV EMUL
INTRAVENOUS | Status: AC
  Filled 2016-03-22: qty 100

## 2016-03-22 MED ORDER — PROPOFOL 10 MG/ML IV BOLUS
INTRAVENOUS | Status: AC | PRN
  Administered 2016-03-22: 40 mg via INTRAVENOUS
  Administered 2016-03-22 (×2): 20 mg via INTRAVENOUS
  Administered 2016-03-22: 40 mg via INTRAVENOUS

## 2016-03-22 MED ORDER — MORPHINE SULFATE (PF) 4 MG/ML IV SOLN
4.0000 mg | Freq: Once | INTRAVENOUS | Status: AC
  Administered 2016-03-22: 4 mg via INTRAVENOUS
  Filled 2016-03-22: qty 1

## 2016-03-22 NOTE — ED Provider Notes (Signed)
Time Seen: Approximately 0 740  I have reviewed the triage notes  Chief Complaint: Hip Pain   History of Present Illness: Debbie Foster is a 82 y.o. female who states that she was ambulating down the driveway and bent over to pick up the newspaper and felt a pop in her left hip. She states she had a hip replacement there she thinks in 1998. She states that she's never had any difficulties with her hip replacement. She was unable to ambulate and went to the ground gradually. She denies any syncopal episode or head trauma.   Past Medical History  Diagnosis Date  . Hypertension   . Heart murmur   . Bronchitis 11/2013    "first time I've ever had it; still coughing from it now" (08/05/2014)  . GERD (gastroesophageal reflux disease)     h/o uses OTC  . Blood dyscrasia   . Diverticulitis 2004  . High cholesterol   . Migraine     "stopped w/the change of life; age 60"  . Stroke [facility])     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  . Double vision 2012    "left eye; from the eye stroke; can see fine w/my glasses"  . Arthritis     "just about q bone in my body"  . Squamous carcinoma (HCC)     arms and face    Patient Active Problem List   Diagnosis Date Noted  . Rotator cuff arthropathy 08/05/2014    Past Surgical History  Procedure Laterality Date  . Total hip arthroplasty Left 1998  . Total knee arthroplasty Right 2005  . Cataract extraction w/ intraocular lens implant Right 2003  . Cataract extraction w/ intraocular lens implant Left 2006  . Reverse shoulder arthroplasty Right 08/05/2014  . Carpal tunnel release Right   . Tonsillectomy    . Joint replacement    . Reverse shoulder arthroplasty Right 08/05/2014    Procedure: REVERSE SHOULDER ARTHROPLASTY;  Surgeon: Mable Paris, MD;  Location: [facility] OR;  Service: Orthopedics;  Laterality: Right;  Right reverse total shoulder arthroplasty    Past Surgical History  Procedure Laterality Date  . Total hip arthroplasty Left 1998   . Total knee arthroplasty Right 2005  . Cataract extraction w/ intraocular lens implant Right 2003  . Cataract extraction w/ intraocular lens implant Left 2006  . Reverse shoulder arthroplasty Right 08/05/2014  . Carpal tunnel release Right   . Tonsillectomy    . Joint replacement    . Reverse shoulder arthroplasty Right 08/05/2014    Procedure: REVERSE SHOULDER ARTHROPLASTY;  Surgeon: Mable Paris, MD;  Location: [facility] OR;  Service: Orthopedics;  Laterality: Right;  Right reverse total shoulder arthroplasty    Current Outpatient Rx  Name  Route  Sig  Dispense  Refill  . aspirin 81 MG tablet   Oral   Take 81 mg by mouth daily.         . Cholecalciferol (VITAMIN D3) 1000 UNITS CAPS   Oral   Take 1,000 Units by mouth daily.         . clopidogrel (PLAVIX) 75 MG tablet   Oral   Take 75 mg by mouth daily.         Marland Kitchen co-enzyme Q-10 30 MG capsule   Oral   Take 60 mg by mouth daily.         Marland Kitchen docusate sodium (COLACE) 100 MG capsule   Oral   Take 1 capsule (100 mg  total) by mouth 3 (three) times daily as needed.   20 capsule   0   . HYDROcodone-acetaminophen (NORCO) 5-325 MG per tablet   Oral   Take 1 tablet by mouth every 6 (six) hours as needed for moderate pain.   30 tablet   0   . metoprolol succinate (TOPROL-XL) 50 MG 24 hr tablet   Oral   Take 50 mg by mouth daily.          . Multiple Vitamins-Minerals (MULTIVITAMIN WITH MINERALS) tablet   Oral   Take 1 tablet by mouth daily.         . Olmesartan-Amlodipine-HCTZ (TRIBENZOR) 40-10-25 MG TABS   Oral   Take 1 tablet by mouth every morning.         . simvastatin (ZOCOR) 40 MG tablet   Oral   Take 40 mg by mouth at bedtime.         . trolamine salicylate (ASPERCREME) 10 % cream   Topical   Apply 1 application topically as needed for muscle pain.           Allergies:  Review of patient's allergies indicates no known allergies.  Family History: No family history on file.  Social  History: Social History  Substance Use Topics  . Smoking status: Former Smoker -- 0.00 packs/day    Types: Cigarettes  . Smokeless tobacco: Never Used     Comment: "smoked less than 1 pack of cigarettes in my life; didn't like it"  . Alcohol Use: No     Review of Systems:   10 point review of systems was performed and was otherwise negative:  Constitutional: No fever Eyes: No visual disturbances ENT: No sore throat, ear pain Cardiac: No chest pain Respiratory: No shortness of breath, wheezing, or stridor Abdomen: No abdominal pain, no vomiting, No diarrhea Endocrine: No weight loss, No night sweats Extremities: She describes some swollen feeling in her left lower extremity Skin: No rashes, easy bruising Neurologic: No focal weakness, trouble with speech or swollowing Urologic: No dysuria, Hematuria, or urinary frequency   Physical Exam:  ED Triage Vitals  Enc Vitals Group     BP 03/22/16 0734 157/82 mmHg     Pulse Rate 03/22/16 0734 85     Resp 03/22/16 0734 18     Temp 03/22/16 0734 97.4 F (36.3 C)     Temp Source 03/22/16 0734 Oral     SpO2 03/22/16 0734 99 %     Weight 03/22/16 0734 111 lb (50.349 kg)     Height 03/22/16 0734 5\' 2"  (1.575 m)     Head Cir --      Peak Flow --      Pain Score 03/22/16 0735 2     Pain Loc --      Pain Edu? --      Excl. in GC? --     General: Awake , Alert , and Oriented times 3; GCS 15 Head: Normal cephalic , atraumatic Eyes: Pupils equal , round, reactive to light Nose/Throat: No nasal drainage, patent upper airway without erythema or exudate.  Neck: Supple, Full range of motion, No anterior adenopathy or palpable thyroid masses Lungs: Clear to ascultation without wheezes , rhonchi, or rales Heart: Regular rate, regular rhythm without murmurs , gallops , or rubs Abdomen: Soft, non tender without rebound, guarding , or rigidity; bowel sounds positive and symmetric in all 4 quadrants. No organomegaly .        Extremities:  Limited  range of motion of the left lower extremity secondary discomfort. She is able to plantar flex and extend her left leg and states sensory is intact she has 2+ dorsalis pedis and posterior tibial pulse. Neurologic: , Motor symmetric without deficits, sensory intact Skin: warm, dry, no rashes    Radiology: * CLINICAL DATA: Status post reduction of left hip dislocation.  EXAM: DG HIP (WITH OR WITHOUT PELVIS) 2-3V LEFT  COMPARISON: 03/22/2016 at 0801 hours  FINDINGS: Sequelae of left total hip arthroplasty are again identified. Prosthesis dislocation has been reduced in the interim. No acute fracture is identified. Moderate right hip joint space narrowing is noted. Calcified phleboliths in the pelvis. Partially visualized lower lumbar disc degeneration.  IMPRESSION: Interval reduction of left hip prosthesis dislocation.   Electronically Signed By: Sebastian Ache M.D. On: 03/22/2016 10:59          DG HIP UNILAT WITH PELVIS 2-3 VIEWS LEFT (Final result) Result time: 03/22/16 08:26:16   Final result by Rad Results In Interface (03/22/16 08:26:16)   Narrative:   CLINICAL DATA: Left hip pain, felt a pop, unable to move  EXAM: DG HIP (WITH OR WITHOUT PELVIS) 2-3V LEFT  COMPARISON: None.  FINDINGS: Three views of the left hip submitted. Left hip arthroplasty. There is superolateral dislocation of femoral component from acetabular component of left hip prosthesis. No acute fracture is identified. There is diffuse osteopenia. Degenerative changes lower lumbar spine.  IMPRESSION: There is superolateral dislocation of femoral component from acetabular component of left hip prosthesis. No acute fracture is identified.   Electronically Signed         I personally reviewed the radiologic studies   Procedures:  #1 Conscious sedation: Patient had consent paperwork obtained for conscious sedation for reduction of left hip prosthetic device. Her airway  was examined and there does not appear to be any contraindications to proceeding with conscious sedation. Patient appears to be of understanding. She was placed on a continuous cardiac monitor and pulse oximetry. An had IV propofol injected low-dose on multiple occasions by myself to obtain adequate sedation. Her pulse ox remained stable. She had no side effects such as nausea or vomiting. Patient appeared to tolerate the procedure well. Conscious sedation one-on-one time was 23 minutes  Procedure #2 Left hip reduction: Again consent was obtained with attempt at reduction of the left hip prosthetic device. After conscious sedation was obtained the patient was relaxed enough she had reduction of her left hip using the hip and flexion and pulling on position of on top of the stretcher. Patient tolerated the procedure well and repeat film shows adequate reduction. I did create a skin tear on her left leg and we discussed that post procedurally on how to proceed with care at home. The patient had a knee immobilizer applied by the nursing staff   ED Course: Patient appears to be doing well post procedurally and once awake and alert enough the patient will be discharged per nursing staff. Brief phone contact was made with Dr.Menz to establish outpatient follow-up. The patient was advised to wear the knee immobilizer when up and ambulatory. She should contact his office tomorrow for follow-up as directed  Assessment:  Dislocation of left prosthetic hip       Plan: * Outpatient management Patient was advised to return immediately if condition worsens. Patient was advised to follow up with their primary care physician or other specialized physicians involved in their outpatient care. The patient and/or family member/power of attorney had laboratory results  reviewed at the bedside. All questions and concerns were addressed and appropriate discharge instructions were distributed by the nursing  staff.             Jennye MoccasinBrian S Quigley, MD 03/22/16 [phone]

## 2016-03-22 NOTE — Discharge Instructions (Signed)
Hip Dislocation Hip dislocation is the displacement of the "ball" at the head of your thigh bone (femur) from its socket in the hip bone (pelvis). The ball-and-socket structure of the hip joint gives it a lot of stability, while allowing it to move freely. Therefore, a lot of force is required to displace the femur from its socket. A hip dislocation is an emergency. If you believe you have dislocated your hip and cannot move your leg, call for help immediately. Do not try to move. CAUSES The most common cause of hip dislocation is motor vehicle accidents. However, force from falls from a height (a ladder or building), injuries from contact sports, or injuries from industrial accidents can be enough to dislocate your hip. SYMPTOMS A hip dislocation is very painful. If you have a dislocated hip, you will not be able to move your hip. If you have nerve damage, you may not have feeling in your lower leg, foot, or ankle.  DIAGNOSIS Usually, your caregiver can diagnose a hip dislocation by looking at the position of your leg. Generally, X-ray exams are done to check for fractures in your femur or pelvis. The leg of the dislocated hip will appear shorter than the other leg, and your foot will be turned inward. TREATMENT  Your caregiver can manipulate your bones back into the joint (reduction). If there are no other complications involved with your dislocation, such as fractures or damage to blood vessels or nerves, this procedure can be done without surgery. Before this procedure, you will be given medicine so that you will not feel pain (anesthetic). Often specialized imaging exams are done after the reduction (magnetic resonance imaging [MRI] or computed tomography [CT]) to check for loose pieces of cartilage or bone in the joint. If a manual reduction fails or you have nerve damage, damage to your blood vessels, or bone fractures, surgery will be necessary to perform the reduction.  HOME CARE  INSTRUCTIONS The following measures can help to reduce pain and speed up the healing process:  Rest your injured joint. Do not move your joint if it is painful. Also, avoid activities similar to the one that caused your injury.  Apply ice to your injured joint for 1 to 2 days after your reduction or as directed by your caregiver. Applying ice helps to reduce inflammation and pain.  Put ice in a plastic bag.  Place a towel between your skin and the bag.  Leave the ice on for 15 to 20 minutes at a time, every 2 hours while you are awake.  Use crutches or a walker as directed by your caregiver.  Exercise your hip and leg as directed by your caregiver.  Take over-the-counter or prescription medicine for pain as directed by your caregiver. SEEK IMMEDIATE MEDICAL CARE IF:  Your pain becomes worse rather than better.  You feel like your hip has become dislocated again. MAKE SURE YOU:  Understand these instructions.  Will watch your condition.  Will get help right away if you are not doing well or get worse.   This information is not intended to replace advice given to you by your health care provider. Make sure you discuss any questions you have with your health care provider.   Document Released: 08/09/2001 Document Revised: 12/05/2014 Document Reviewed: 06/16/2015 Elsevier Interactive Patient Education 2016 [employer].  Please leave the knee immobilizer on for at least 48 hours when up and ambulatory. Return to emergency department for any concerns. Standard wound dressing to your  skin tear on her left leg

## 2016-03-22 NOTE — ED Notes (Signed)
Pt reports she was walking down to pick up the paper when she bent over she felt and heard a pop to her left hip. Pt has had left hip replacement in the past.

## 2016-11-27 IMAGING — CR DG HIP (WITH OR WITHOUT PELVIS) 2-3V*L*
3 series · 3 of 3 positions shown · non-contrast
Comparison: 03/22/2016 at 4649 hours

CLINICAL DATA: Status post reduction of left hip dislocation.

EXAM:
DG HIP (WITH OR WITHOUT PELVIS) 2-3V LEFT

[pelvis ap]
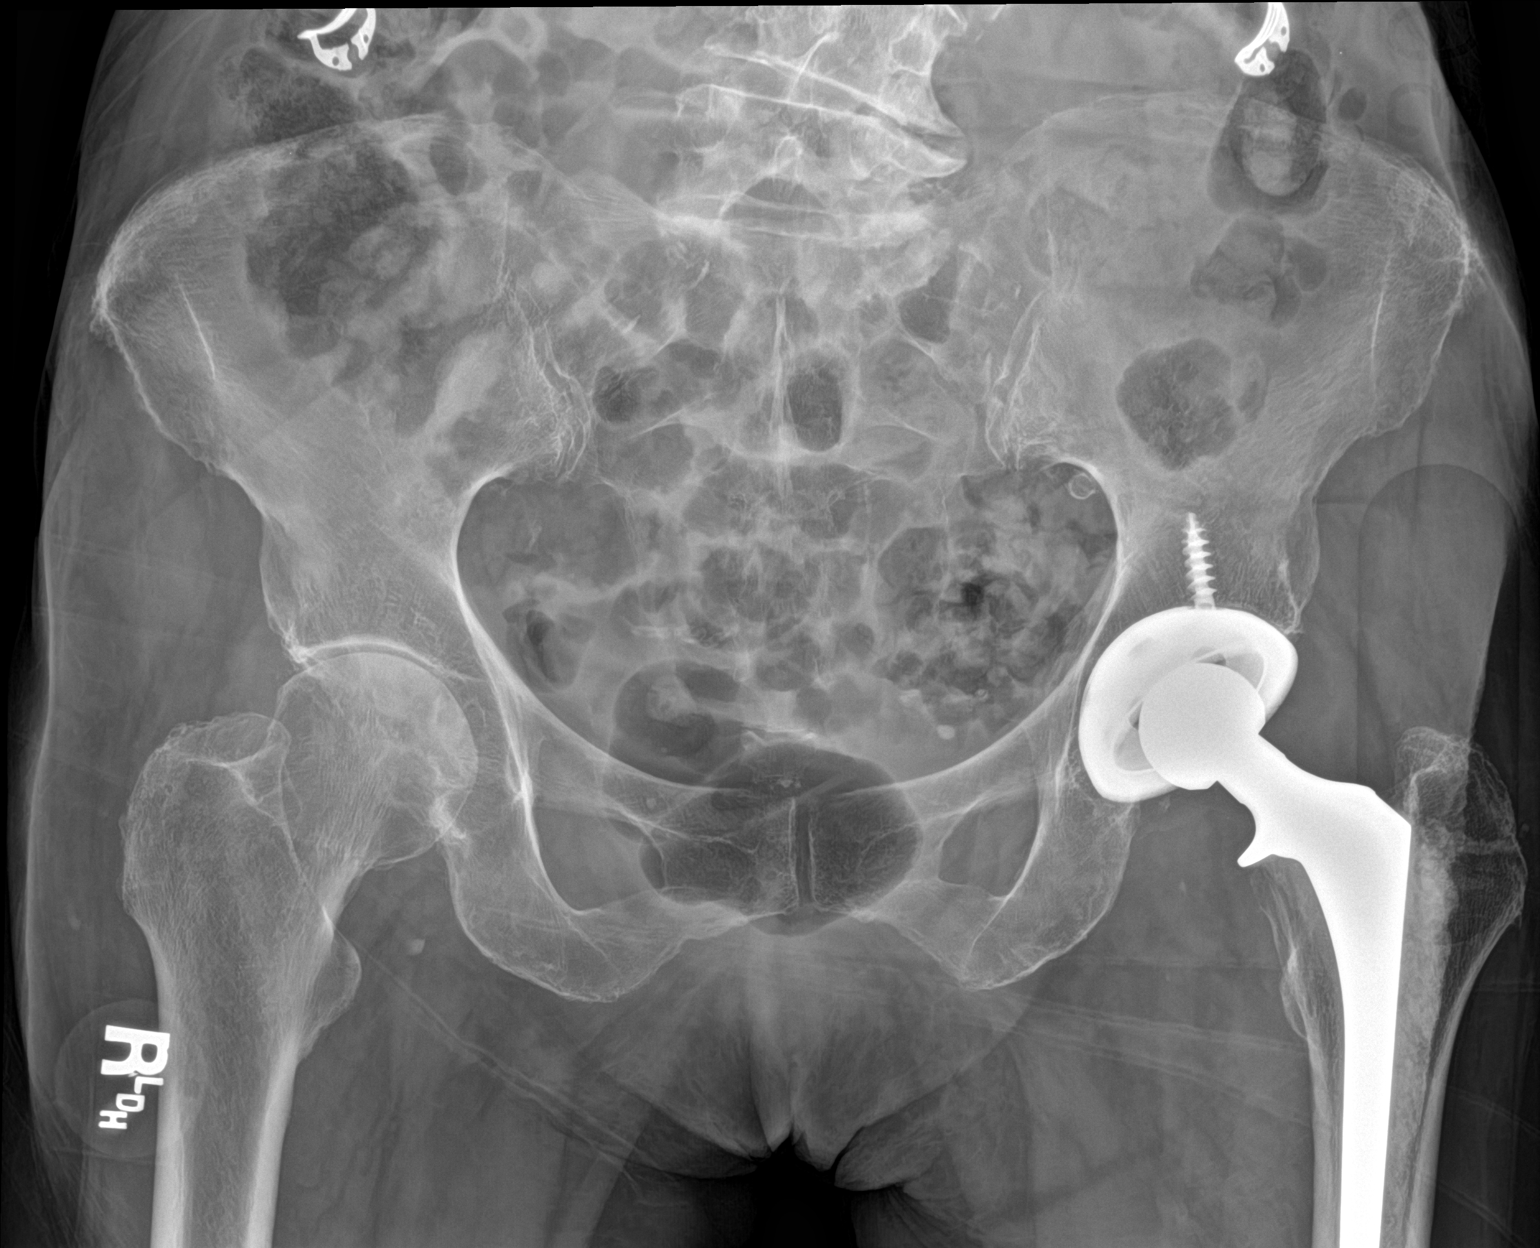

[hip ap]
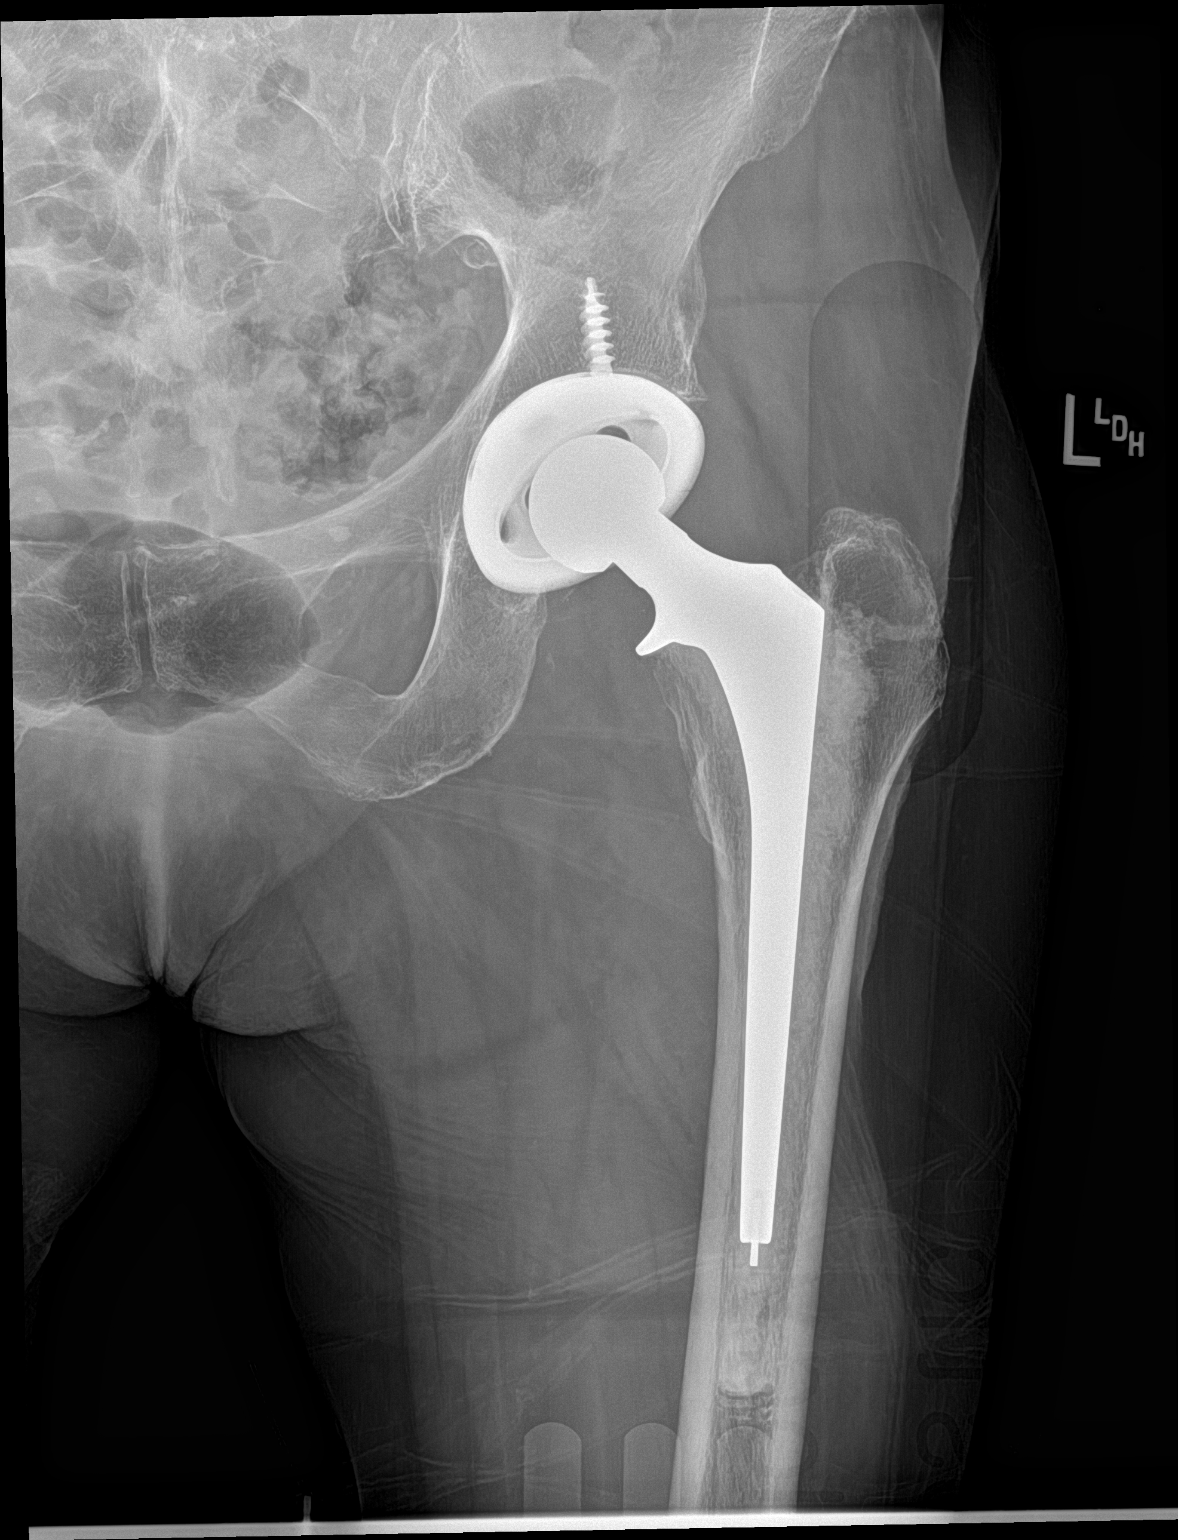

[hip lat]
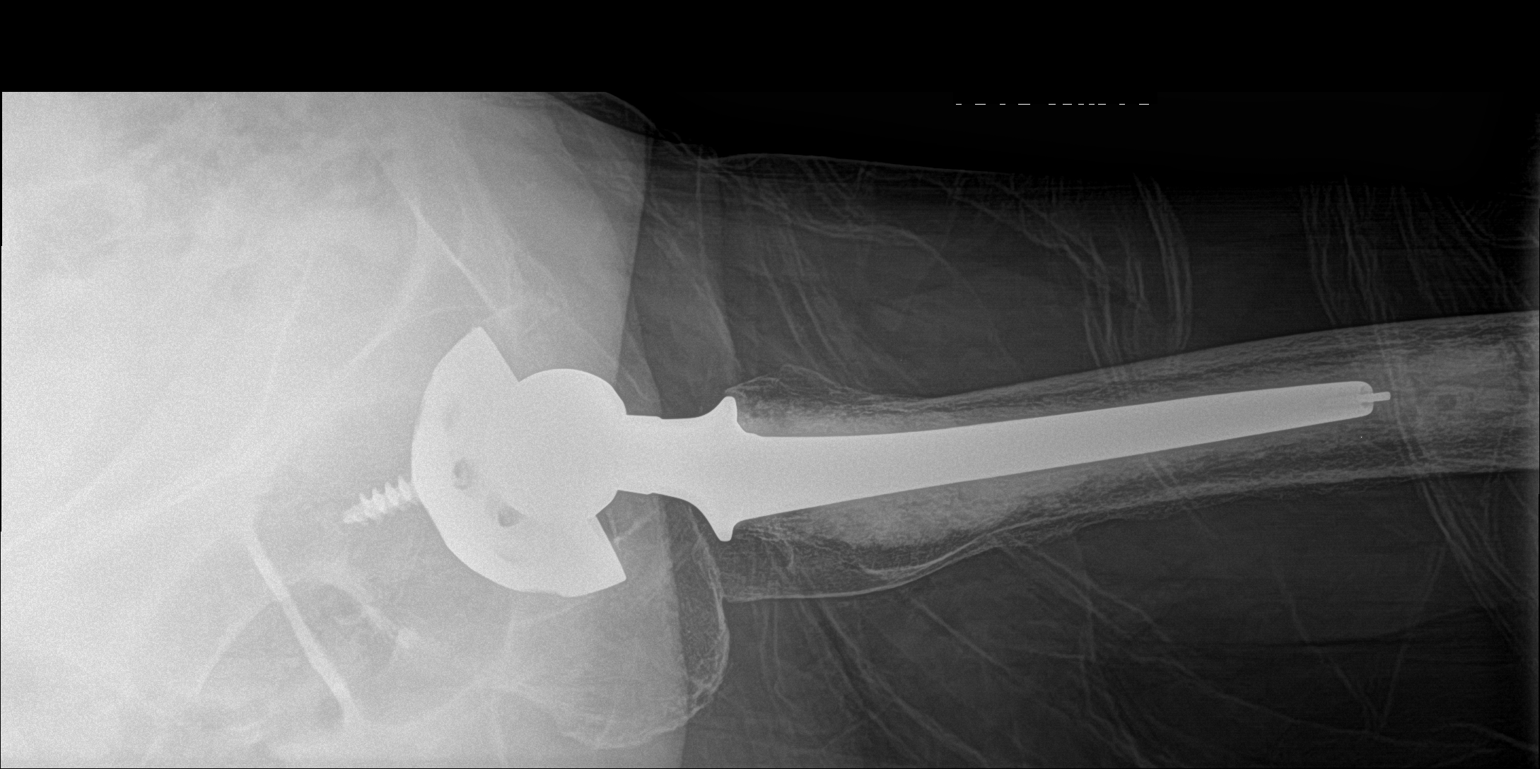

[3 of 3 positions shown; findings below may reference images not displayed]

FINDINGS: Sequelae of left total hip arthroplasty are again identified.
Prosthesis dislocation has been reduced in the interim. No acute
fracture is identified. Moderate right hip joint space narrowing is
noted. Calcified phleboliths in the pelvis. Partially visualized
lower lumbar disc degeneration.
IMPRESSION: Interval reduction of left hip prosthesis dislocation.

## 2020-02-23 ENCOUNTER — Ambulatory Visit: Payer: Medicare Other | Attending: Internal Medicine

## 2020-02-23 DIAGNOSIS — Z23 Encounter for immunization: Secondary | ICD-10-CM

## 2020-02-23 NOTE — Progress Notes (Signed)
   Covid-19 Vaccination Clinic  Name:  Debbie Foster    MRN: [id number] DOB: [birth year]  02/23/2020  Debbie Foster was observed post Covid-19 immunization for 15 minutes without incident. She was provided with Vaccine Information Sheet and instruction to access the V-Safe system.   Debbie Foster was instructed to call 911 with any severe reactions post vaccine: Marland Kitchen Difficulty breathing  . Swelling of face and throat  . A fast heartbeat  . A bad rash all over body  . Dizziness and weakness   Immunizations Administered    Name Date Dose VIS Date Route   Pfizer COVID-19 Vaccine 02/23/2020  8:32 AM 0.3 mL 11/08/2019 Intramuscular   Manufacturer: [employer], [employer]   Lot: [id number]   NDC: [address]-4

## 2020-03-18 ENCOUNTER — Ambulatory Visit: Payer: Medicare Other | Attending: Internal Medicine

## 2020-03-18 ENCOUNTER — Ambulatory Visit: Payer: Medicare Other

## 2020-03-18 DIAGNOSIS — Z23 Encounter for immunization: Secondary | ICD-10-CM

## 2020-03-18 NOTE — Progress Notes (Signed)
   Covid-19 Vaccination Clinic  Name:  Debbie Foster    MRN: [id number] DOB: 10-07-1925  03/18/2020  Debbie Foster was observed post Covid-19 immunization for 15 minutes without incident. She was provided with Vaccine Information Sheet and instruction to access the V-Safe system.   Debbie Foster was instructed to call 911 with any severe reactions post vaccine: Marland Kitchen Difficulty breathing  . Swelling of face and throat  . A fast heartbeat  . A bad rash all over body  . Dizziness and weakness   Immunizations Administered    Name Date Dose VIS Date Route   Pfizer COVID-19 Vaccine 03/18/2020  8:20 AM 0.3 mL 01/22/2019 Intramuscular   Manufacturer: [employer], [employer]   Lot: [id number]   NDC: [address]-7

## 2022-07-14 ENCOUNTER — Emergency Department: Payer: Medicare Other

## 2022-07-14 ENCOUNTER — Other Ambulatory Visit: Payer: Self-pay

## 2022-07-14 DIAGNOSIS — I10 Essential (primary) hypertension: Secondary | ICD-10-CM | POA: Insufficient documentation

## 2022-07-14 DIAGNOSIS — Z96642 Presence of left artificial hip joint: Secondary | ICD-10-CM | POA: Diagnosis not present

## 2022-07-14 DIAGNOSIS — Z7902 Long term (current) use of antithrombotics/antiplatelets: Secondary | ICD-10-CM | POA: Insufficient documentation

## 2022-07-14 DIAGNOSIS — Y92009 Unspecified place in unspecified non-institutional (private) residence as the place of occurrence of the external cause: Secondary | ICD-10-CM | POA: Insufficient documentation

## 2022-07-14 DIAGNOSIS — S99912A Unspecified injury of left ankle, initial encounter: Secondary | ICD-10-CM | POA: Diagnosis present

## 2022-07-14 DIAGNOSIS — S8001XA Contusion of right knee, initial encounter: Secondary | ICD-10-CM | POA: Insufficient documentation

## 2022-07-14 DIAGNOSIS — Z96651 Presence of right artificial knee joint: Secondary | ICD-10-CM | POA: Diagnosis not present

## 2022-07-14 DIAGNOSIS — Z85828 Personal history of other malignant neoplasm of skin: Secondary | ICD-10-CM | POA: Insufficient documentation

## 2022-07-14 DIAGNOSIS — Z7982 Long term (current) use of aspirin: Secondary | ICD-10-CM | POA: Insufficient documentation

## 2022-07-14 DIAGNOSIS — S0990XA Unspecified injury of head, initial encounter: Secondary | ICD-10-CM | POA: Diagnosis not present

## 2022-07-14 DIAGNOSIS — Z79899 Other long term (current) drug therapy: Secondary | ICD-10-CM | POA: Diagnosis not present

## 2022-07-14 DIAGNOSIS — W01198A Fall on same level from slipping, tripping and stumbling with subsequent striking against other object, initial encounter: Secondary | ICD-10-CM | POA: Diagnosis not present

## 2022-07-14 DIAGNOSIS — S9002XA Contusion of left ankle, initial encounter: Secondary | ICD-10-CM | POA: Diagnosis not present

## 2022-07-14 DIAGNOSIS — Z96611 Presence of right artificial shoulder joint: Secondary | ICD-10-CM | POA: Insufficient documentation

## 2022-07-14 NOTE — ED Triage Notes (Signed)
Pt presents to ER via ems from home after a mechanical fall at home.  Pt states she got up out of chair, started to take a step and fell.  Pt states she hit her head on cushioned arm rest of chair.  Denies any pain to head.  Pt does have pain and swelling noted to left ankle with some bruising.  Pt does take plavix at home.  Pt is A&O x4 at this time in NAD.  Pt denies any LOC.

## 2022-07-15 ENCOUNTER — Emergency Department
Admission: EM | Admit: 2022-07-15 | Discharge: 2022-07-15 | Disposition: A | Discharge status: Home / Self Care | Payer: Medicare Other | Attending: Emergency Medicine | Admitting: Emergency Medicine

## 2022-07-15 DIAGNOSIS — S0990XA Unspecified injury of head, initial encounter: Secondary | ICD-10-CM

## 2022-07-15 DIAGNOSIS — W19XXXA Unspecified fall, initial encounter: Secondary | ICD-10-CM

## 2022-07-15 DIAGNOSIS — T07XXXA Unspecified multiple injuries, initial encounter: Secondary | ICD-10-CM

## 2022-07-15 MED ORDER — ACETAMINOPHEN 500 MG PO TABS
1000.0000 mg | ORAL_TABLET | Freq: Once | ORAL | Status: AC
Start: 2022-07-15 — End: 2022-07-15
  Administered 2022-07-15: 1000 mg via ORAL
  Filled 2022-07-15: qty 2

## 2022-07-15 NOTE — Discharge Instructions (Addendum)
You may take Tylenol 1000 mg every 6 hours as needed for pain. °

## 2022-07-15 NOTE — ED Provider Notes (Signed)
[facility] Provider Note    Event Date/Time   First MD Initiated Contact with Patient 07/15/22 0109     (approximate)   History   Fall and Ankle Pain   HPI  Debbie Foster is a 88 y.o. female with history of hypertension, hyperlipidemia, CVA on Plavix who presents to the emergency department with her daughter after she tripped and fell tonight.  Reports she did hit her head on the armrest of the chair and also bruised her left ankle and right knee.  Normally ambulates with a cane but does have a walker at home.  Lives at home alone but her daughter lives next to her.  She denies any loss of consciousness.  No preceding symptoms that led to her fall.  No neck or back pain, chest pain, abdominal pain.   History provided by patient and daughter.    Past Medical History:  Diagnosis Date   Arthritis    "just about q bone in my body"   Blood dyscrasia    Bronchitis 11/2013   "first time I've ever had it; still coughing from it now" (08/05/2014)   Diverticulitis 2004   Double vision 2012   "left eye; from the eye stroke; can see fine w/my glasses"   GERD (gastroesophageal reflux disease)    h/o uses OTC   Heart murmur    High cholesterol    Hypertension    Migraine    "stopped w/the change of life; age 53"   Squamous carcinoma    arms and face   Stroke (HCC)    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    Past Surgical History:  Procedure Laterality Date   CARPAL TUNNEL RELEASE Right    CATARACT EXTRACTION W/ INTRAOCULAR LENS IMPLANT Right 2003   CATARACT EXTRACTION W/ INTRAOCULAR LENS IMPLANT Left 2006   JOINT REPLACEMENT     REVERSE SHOULDER ARTHROPLASTY Right 08/05/2014   REVERSE SHOULDER ARTHROPLASTY Right 08/05/2014   Procedure: REVERSE SHOULDER ARTHROPLASTY;  Surgeon: Mable Paris, MD;  Location: [facility] OR;  Service: Orthopedics;  Laterality: Right;  Right reverse total shoulder arthroplasty   TONSILLECTOMY     TOTAL HIP ARTHROPLASTY Left 1998    TOTAL KNEE ARTHROPLASTY Right 2005    MEDICATIONS:  Prior to Admission medications   Medication Sig Start Date End Date Taking? Authorizing Provider  aspirin 81 MG tablet Take 81 mg by mouth daily.    [provider]  Cholecalciferol (VITAMIN D3) 1000 UNITS CAPS Take 1,000 Units by mouth daily.    [provider]  clopidogrel (PLAVIX) 75 MG tablet Take 75 mg by mouth daily.    [provider]  co-enzyme Q-10 30 MG capsule Take 60 mg by mouth daily.    [provider]  diltiazem (CARDIZEM) 120 MG tablet Take 1 tablet by mouth daily. 02/10/16   [provider]  metoprolol succinate (TOPROL-XL) 50 MG 24 hr tablet Take 50 mg by mouth daily.     [provider]  Multiple Vitamins-Minerals (MULTIVITAMIN WITH MINERALS) tablet Take 1 tablet by mouth daily.    [provider]  nitroGLYCERIN (NITROSTAT) 0.4 MG SL tablet Place 0.4 mg under the tongue every 5 (five) minutes as needed.  03/02/16   [provider]  Olmesartan-Amlodipine-HCTZ (TRIBENZOR) 40-10-25 MG TABS Take 1 tablet by mouth every morning.    [provider]  simvastatin (ZOCOR) 40 MG tablet Take 40 mg by mouth at bedtime.    [provider]  trolamine salicylate (ASPERCREME) 10 % cream Apply 1 application topically as needed for muscle pain.    [provider]    Physical Exam   Triage Vital Signs: ED Triage Vitals  Enc Vitals Group     BP 07/14/22 2148 (!) 151/97     Pulse Rate 07/14/22 2148 74     Resp 07/14/22 2148 18     Temp 07/14/22 2148 97.8 F (36.6 C)     Temp Source 07/14/22 2148 Oral     SpO2 07/14/22 2148 94 %     Weight 07/14/22 2149 88 lb (39.9 kg)     Height 07/14/22 2149 5\' 2"  (1.575 m)     Head Circumference --      Peak Flow --      Pain Score 07/14/22 2149 4     Pain Loc --      Pain Edu? --      Excl. in [place]? --     Most recent vital signs: Vitals:   07/14/22 2148  BP: (!) 151/97  Pulse: 74  Resp: 18   Temp: 97.8 F (36.6 C)  SpO2: 94%     CONSTITUTIONAL: Alert and oriented and responds appropriately to questions. Well-appearing; well-nourished; GCS 1, elderly, pleasant HEAD: Normocephalic; atraumatic EYES: Conjunctivae clear, PERRL, EOMI ENT: normal nose; no rhinorrhea; moist mucous membranes; pharynx without lesions noted; no dental injury; no septal hematoma, no epistaxis; no facial deformity or bony tenderness NECK: Supple, no midline spinal tenderness, step-off or deformity; trachea midline CARD: RRR; S1 and S2 appreciated; no murmurs, no clicks, no rubs, no gallops RESP: Normal chest excursion without splinting or tachypnea; breath sounds clear and equal bilaterally; no wheezes, no rhonchi, no rales; no hypoxia or respiratory distress CHEST:  chest wall stable, no crepitus or ecchymosis or deformity, nontender to palpation; no flail chest ABD/GI: Normal bowel sounds; non-distended; soft, non-tender, no rebound, no guarding; no ecchymosis or other lesions noted PELVIS:  stable, nontender to palpation BACK:  The back appears normal; no midline spinal tenderness, step-off or deformity EXT: Patient has some ecchymosis to the medial left ankle and the right knee with soft tissue swelling.  There is no ligamentous laxity.  Extremities warm and well-perfused.  Compartments soft.  No leg length discrepancy. SKIN: Normal color for age and race; warm NEURO: No facial asymmetry, normal speech, moving all extremities equally, ambulates with a walker with out any other assistance, normal sensation  ED Results / Procedures / Treatments   LABS: (all labs ordered are listed, but only abnormal results are displayed) Labs Reviewed - No data to display   EKG:      RADIOLOGY: My personal review and interpretation of imaging: CT scans and x-ray showed no acute traumatic injury other than soft tissue swelling.  I have personally reviewed all radiology reports. DG Knee Complete 4 Views  Right  Result Date: 07/14/2022 CLINICAL DATA:  Mechanical fall at home. EXAM: RIGHT KNEE - COMPLETE 4+ VIEW COMPARISON:  Remote radiograph 09/24/2006 FINDINGS: Right knee arthroplasty in expected alignment. No acute or periprosthetic fracture. There is no periprosthetic lucency. There has been patellar resurfacing. No significant knee joint effusion. Small quadriceps tendon enthesophyte. Vascular calcifications are seen. IMPRESSION: Intact right knee arthroplasty without complication. No acute or periprosthetic fracture. Electronically Signed   By: Keith Rake M.D.   On: 07/14/2022 22:45   DG Ankle Complete Left  Result Date: 07/14/2022 CLINICAL DATA:  Fall with ankle pain. EXAM: LEFT ANKLE COMPLETE -  3+ VIEW; LEFT FOOT - COMPLETE 3+ VIEW COMPARISON:  None Available. FINDINGS: Left ankle: There is diffuse soft tissue swelling surrounding the ankle, particularly medially. There is no acute fracture or dislocation. The bones are osteopenic. Left foot: There is no acute fracture or dislocation identified. The bones are osteopenic. There are moderate degenerative changes of the first metatarsophalangeal joint with joint space narrowing and osteophyte formation. Mild hallux valgus is present. IMPRESSION: 1. Soft tissue swelling of the left ankle. 2. No acute fracture of the left ankle or left foot. Electronically Signed   By: Ronney Asters M.D.   On: 07/14/2022 22:43   DG Foot Complete Left  Result Date: 07/14/2022 CLINICAL DATA:  Fall with ankle pain. EXAM: LEFT ANKLE COMPLETE - 3+ VIEW; LEFT FOOT - COMPLETE 3+ VIEW COMPARISON:  None Available. FINDINGS: Left ankle: There is diffuse soft tissue swelling surrounding the ankle, particularly medially. There is no acute fracture or dislocation. The bones are osteopenic. Left foot: There is no acute fracture or dislocation identified. The bones are osteopenic. There are moderate degenerative changes of the first metatarsophalangeal joint with joint space  narrowing and osteophyte formation. Mild hallux valgus is present. IMPRESSION: 1. Soft tissue swelling of the left ankle. 2. No acute fracture of the left ankle or left foot. Electronically Signed   By: Ronney Asters M.D.   On: 07/14/2022 22:43   CT Head Wo Contrast  Result Date: 07/14/2022 CLINICAL DATA:  Head trauma, minor (Age >= 65y); fall EXAM: CT HEAD WITHOUT CONTRAST CT CERVICAL SPINE WITHOUT CONTRAST TECHNIQUE: Multidetector CT imaging of the head and cervical spine was performed following the standard protocol without intravenous contrast. Multiplanar CT image reconstructions of the cervical spine were also generated. RADIATION DOSE REDUCTION: This exam was performed according to the departmental dose-optimization program which includes automated exposure control, adjustment of the mA and/or kV according to patient size and/or use of iterative reconstruction technique. COMPARISON:  None Available. FINDINGS: CT HEAD FINDINGS Brain: Patchy and confluent areas of decreased attenuation are noted throughout the deep and periventricular white matter of the cerebral hemispheres bilaterally, compatible with chronic microvascular ischemic disease. No evidence of large-territorial acute infarction. No parenchymal hemorrhage. No mass lesion. No extra-axial collection. No mass effect or midline shift. No hydrocephalus. Basilar cisterns are patent. Vascular: No hyperdense vessel. Atherosclerotic calcifications are present within the cavernous internal carotid and vertebral arteries. Skull: No acute fracture or focal lesion. Sinuses/Orbits: Paranasal sinuses and mastoid air cells are clear. Bilateral lens replacement. Otherwise the orbits are unremarkable. Other: None. CT CERVICAL SPINE FINDINGS Alignment: Grade 1 anterolisthesis of C3 on C4, C4 on C5, C7 on T1, T1 on T2, T2 on T3. Skull base and vertebrae: Multilevel severe degenerative changes of the spine most prominent at the C5 through C7 levels. Ligamentum  flavum calcifications. No acute fracture. No aggressive appearing focal osseous lesion or focal pathologic process. Soft tissues and spinal canal: No prevertebral fluid or swelling. No visible canal hematoma. Upper chest: Biapical pleural/pulmonary scarring. 4 mm right apical pulmonary nodule (2:70). Other: None. IMPRESSION: 1. No acute intracranial abnormality. 2. No acute displaced fracture or traumatic listhesis of the cervical spine. Electronically Signed   By: Iven Finn M.D.   On: 07/14/2022 22:39   CT Cervical Spine Wo Contrast  Result Date: 07/14/2022 CLINICAL DATA:  Head trauma, minor (Age >= 65y); fall EXAM: CT HEAD WITHOUT CONTRAST CT CERVICAL SPINE WITHOUT CONTRAST TECHNIQUE: Multidetector CT imaging of the head and cervical spine was performed following  the standard protocol without intravenous contrast. Multiplanar CT image reconstructions of the cervical spine were also generated. RADIATION DOSE REDUCTION: This exam was performed according to the departmental dose-optimization program which includes automated exposure control, adjustment of the mA and/or kV according to patient size and/or use of iterative reconstruction technique. COMPARISON:  None Available. FINDINGS: CT HEAD FINDINGS Brain: Patchy and confluent areas of decreased attenuation are noted throughout the deep and periventricular white matter of the cerebral hemispheres bilaterally, compatible with chronic microvascular ischemic disease. No evidence of large-territorial acute infarction. No parenchymal hemorrhage. No mass lesion. No extra-axial collection. No mass effect or midline shift. No hydrocephalus. Basilar cisterns are patent. Vascular: No hyperdense vessel. Atherosclerotic calcifications are present within the cavernous internal carotid and vertebral arteries. Skull: No acute fracture or focal lesion. Sinuses/Orbits: Paranasal sinuses and mastoid air cells are clear. Bilateral lens replacement. Otherwise the orbits are  unremarkable. Other: None. CT CERVICAL SPINE FINDINGS Alignment: Grade 1 anterolisthesis of C3 on C4, C4 on C5, C7 on T1, T1 on T2, T2 on T3. Skull base and vertebrae: Multilevel severe degenerative changes of the spine most prominent at the C5 through C7 levels. Ligamentum flavum calcifications. No acute fracture. No aggressive appearing focal osseous lesion or focal pathologic process. Soft tissues and spinal canal: No prevertebral fluid or swelling. No visible canal hematoma. Upper chest: Biapical pleural/pulmonary scarring. 4 mm right apical pulmonary nodule (2:70). Other: None. IMPRESSION: 1. No acute intracranial abnormality. 2. No acute displaced fracture or traumatic listhesis of the cervical spine. Electronically Signed   By: Iven Finn M.D.   On: 07/14/2022 22:39     PROCEDURES:  Critical Care performed: No    Procedures    IMPRESSION / MDM / ASSESSMENT AND PLAN / ED COURSE  I reviewed the triage vital signs and the nursing notes.  Patient here with mechanical fall.  Complaining of right knee pain, left ankle pain, head injury on Plavix.    DIFFERENTIAL DIAGNOSIS (includes but not limited to):   Contusion, fracture, intracranial hemorrhage, concussion  Patient's presentation is most consistent with acute presentation with potential threat to life or bodily function.  PLAN: CT head and cervical spine, x-rays of the left foot and ankle, right knee were obtained from triage.  Imaging reviewed and interpreted by myself and the radiologist and shows no acute injury other than soft tissue swelling.  Will give Tylenol for pain control here and ambulate with a walker.  She is well-appearing without other signs of traumatic injury on exam, neurologically intact, hemodynamically stable.   MEDICATIONS GIVEN IN ED: Medications  acetaminophen (TYLENOL) tablet 1,000 mg (1,000 mg Oral Given 07/15/22 0224)     ED COURSE: Patient has been able to ambulate without assistance using a  walker.  She denies any other complaints of pain.  I feel she is safe for discharge home.  Discussed head injury return precautions.  Discussed supportive care instructions including rest, ice and elevation to her right knee and left ankle.  Recommended Tylenol as needed at home.  Patient and daughter comfortable with this plan.   At this time, I do not feel there is any life-threatening condition present. I reviewed all nursing notes, vitals, pertinent previous records.  All lab and urine results, EKGs, imaging ordered have been independently reviewed and interpreted by myself.  I reviewed all available radiology reports from any imaging ordered this visit.  Based on my assessment, I feel the patient is safe to be discharged home without further emergent  workup and can continue workup as an outpatient as needed. Discussed all findings, treatment plan as well as usual and customary return precautions.  They verbalize understanding and are comfortable with this plan.  Outpatient follow-up has been provided as needed.  All questions have been answered.    CONSULTS: No admission needed at this time given no traumatic injury other than soft tissue injuries on exam and imaging and patient is hemodynamically stable, neurologically intact and able to ambulate.   OUTSIDE RECORDS REVIEWED: Reviewed previous records at [facility] in 2017 for dislocation of the left hip.       FINAL CLINICAL IMPRESSION(S) / ED DIAGNOSES   Final diagnoses:  Fall, initial encounter  Injury of head, initial encounter  Multiple contusions     Rx / DC Orders   ED Discharge Orders     None        Note:  This document was prepared using Dragon voice recognition software and may include unintentional dictation errors.   Alvin Rubano, Layla Maw, DO 07/15/22 [phone]

## 2022-07-15 NOTE — ED Notes (Signed)
Patient ambulated with walker with independent and steady gait

## 2022-07-15 NOTE — ED Notes (Signed)
Patient discharged at this time. Wheeled to lobby and assisted into vehicle. Breathing unlabored speaking in full sentences. Verbalized understanding of all discharge, follow up, and medication teaching. Discharged homed with all belongings.   

## 2022-07-27 ENCOUNTER — Inpatient Hospital Stay
Admission: EM | Admit: 2022-07-27 | Discharge: 2022-08-01 | DRG: 378 | Disposition: A | Discharge status: Home Health | Payer: Medicare Other | Attending: Internal Medicine | Admitting: Internal Medicine

## 2022-07-27 ENCOUNTER — Other Ambulatory Visit: Payer: Self-pay

## 2022-07-27 ENCOUNTER — Emergency Department: Payer: Medicare Other

## 2022-07-27 DIAGNOSIS — K922 Gastrointestinal hemorrhage, unspecified: Principal | ICD-10-CM

## 2022-07-27 DIAGNOSIS — D638 Anemia in other chronic diseases classified elsewhere: Secondary | ICD-10-CM | POA: Diagnosis present

## 2022-07-27 DIAGNOSIS — Z8673 Personal history of transient ischemic attack (TIA), and cerebral infarction without residual deficits: Secondary | ICD-10-CM

## 2022-07-27 DIAGNOSIS — K5731 Diverticulosis of large intestine without perforation or abscess with bleeding: Secondary | ICD-10-CM | POA: Diagnosis not present

## 2022-07-27 DIAGNOSIS — Z85828 Personal history of other malignant neoplasm of skin: Secondary | ICD-10-CM

## 2022-07-27 DIAGNOSIS — Z96651 Presence of right artificial knee joint: Secondary | ICD-10-CM | POA: Diagnosis present

## 2022-07-27 DIAGNOSIS — Z8711 Personal history of peptic ulcer disease: Secondary | ICD-10-CM

## 2022-07-27 DIAGNOSIS — M1990 Unspecified osteoarthritis, unspecified site: Secondary | ICD-10-CM | POA: Diagnosis present

## 2022-07-27 DIAGNOSIS — M25472 Effusion, left ankle: Secondary | ICD-10-CM | POA: Diagnosis present

## 2022-07-27 DIAGNOSIS — Z7902 Long term (current) use of antithrombotics/antiplatelets: Secondary | ICD-10-CM

## 2022-07-27 DIAGNOSIS — Z7982 Long term (current) use of aspirin: Secondary | ICD-10-CM

## 2022-07-27 DIAGNOSIS — R55 Syncope and collapse: Secondary | ICD-10-CM | POA: Diagnosis not present

## 2022-07-27 DIAGNOSIS — D62 Acute posthemorrhagic anemia: Secondary | ICD-10-CM | POA: Diagnosis present

## 2022-07-27 DIAGNOSIS — R54 Age-related physical debility: Secondary | ICD-10-CM | POA: Diagnosis present

## 2022-07-27 DIAGNOSIS — Z96642 Presence of left artificial hip joint: Secondary | ICD-10-CM | POA: Diagnosis present

## 2022-07-27 DIAGNOSIS — Z87891 Personal history of nicotine dependence: Secondary | ICD-10-CM

## 2022-07-27 DIAGNOSIS — Z79899 Other long term (current) drug therapy: Secondary | ICD-10-CM

## 2022-07-27 DIAGNOSIS — Z66 Do not resuscitate: Secondary | ICD-10-CM | POA: Diagnosis present

## 2022-07-27 DIAGNOSIS — D649 Anemia, unspecified: Secondary | ICD-10-CM | POA: Diagnosis present

## 2022-07-27 DIAGNOSIS — Z96611 Presence of right artificial shoulder joint: Secondary | ICD-10-CM | POA: Diagnosis present

## 2022-07-27 DIAGNOSIS — I10 Essential (primary) hypertension: Secondary | ICD-10-CM | POA: Diagnosis present

## 2022-07-27 DIAGNOSIS — I248 Other forms of acute ischemic heart disease: Secondary | ICD-10-CM | POA: Diagnosis present

## 2022-07-27 DIAGNOSIS — E7800 Pure hypercholesterolemia, unspecified: Secondary | ICD-10-CM | POA: Diagnosis present

## 2022-07-27 DIAGNOSIS — Z9181 History of falling: Secondary | ICD-10-CM

## 2022-07-27 DIAGNOSIS — K921 Melena: Secondary | ICD-10-CM | POA: Diagnosis present

## 2022-07-27 DIAGNOSIS — K219 Gastro-esophageal reflux disease without esophagitis: Secondary | ICD-10-CM | POA: Diagnosis present

## 2022-07-27 LAB — URINALYSIS, ROUTINE W REFLEX MICROSCOPIC
Bilirubin Urine: NEGATIVE
Glucose, UA: NEGATIVE mg/dL
Hgb urine dipstick: NEGATIVE
Ketones, ur: NEGATIVE mg/dL
Leukocytes,Ua: NEGATIVE
Nitrite: NEGATIVE
Protein, ur: 30 mg/dL — AB
Specific Gravity, Urine: 1.018 (ref 1.005–1.030)
pH: 5 (ref 5.0–8.0)

## 2022-07-27 LAB — PROTIME-INR
INR: 1.2 (ref 0.8–1.2)
Prothrombin Time: 14.7 seconds (ref 11.4–15.2)

## 2022-07-27 LAB — BASIC METABOLIC PANEL
Anion gap: 9 (ref 5–15)
BUN: 29 mg/dL — ABNORMAL HIGH (ref 8–23)
CO2: 24 mmol/L (ref 22–32)
Calcium: 9.6 mg/dL (ref 8.9–10.3)
Chloride: 104 mmol/L (ref 98–111)
Creatinine, Ser: 0.94 mg/dL (ref 0.44–1.00)
GFR, Estimated: 56 mL/min — ABNORMAL LOW (ref >60)
Glucose, Bld: 132 mg/dL — ABNORMAL HIGH (ref 70–99)
Potassium: 4.2 mmol/L (ref 3.5–5.1)
Sodium: 137 mmol/L (ref 135–145)

## 2022-07-27 LAB — CBC
HCT: 32.2 % — ABNORMAL LOW (ref 36.0–46.0)
Hemoglobin: 10.2 g/dL — ABNORMAL LOW (ref 12.0–15.0)
MCH: 30 pg (ref 26.0–34.0)
MCHC: 31.7 g/dL (ref 30.0–36.0)
MCV: 94.7 fL (ref 80.0–100.0)
Platelets: 346 10*3/uL (ref 150–400)
RBC: 3.4 MIL/uL — ABNORMAL LOW (ref 3.87–5.11)
RDW: 12.8 % (ref 11.5–15.5)
WBC: 16 10*3/uL — ABNORMAL HIGH (ref 4.0–10.5)
nRBC: 0 % (ref 0.0–0.2)

## 2022-07-27 LAB — APTT: aPTT: 31 seconds (ref 24–36)

## 2022-07-27 MED ORDER — PANTOPRAZOLE SODIUM 40 MG IV SOLR
40.0000 mg | Freq: Once | INTRAVENOUS | Status: AC
  Administered 2022-07-27: 40 mg via INTRAVENOUS
  Filled 2022-07-27: qty 10

## 2022-07-27 NOTE — ED Triage Notes (Signed)
Pt arrived via EMS following Life Alert activation from a fall at home. Pt reports "roaring" in ears followed by brief LOC where she woke up on the floor. EMS states patient confused when they arrived but regained A&O x4 PTA at hospital. Pt is A&O x4 and reports no pain on upon assuming care. Pt states she cannot pick up her head but cannot explain why.

## 2022-07-27 NOTE — ED Notes (Signed)
Pt taken to CT.

## 2022-07-27 NOTE — ED Provider Notes (Signed)
[facility] Provider Note    Event Date/Time   First MD Initiated Contact with Patient 07/27/22 2039     (approximate)   History   Fall and Loss of Consciousness  HPI  Debbie Foster is a 88 y.o. female with a history of hypertension, migraines, stroke, and also thinks possibly a stomach ulcer in the remote past  Reports she was urinating this evening when she got lightheaded and passed out.  She relates that she did notice this morning that her stools seem somewhat dark.  Denies any abdominal pain.  No headache no nausea vomiting.  Before she passed out she felt like she felt like a strange kind of rushing feeling towards her head and then passed out and hit her life alert button there after notifying her daughter  She denies any injury.  No chest pain no trouble breathing.  No abdominal pain no fevers chills or recent illness.     Physical Exam   Triage Vital Signs: ED Triage Vitals  Enc Vitals Group     BP 07/27/22 1924 (!) 141/76     Pulse Rate 07/27/22 1924 89     Resp 07/27/22 1924 18     Temp 07/27/22 1924 97.7 F (36.5 C)     Temp Source 07/27/22 1924 Oral     SpO2 07/27/22 1923 97 %     Weight 07/27/22 1925 88 lb (39.9 kg)     Height 07/27/22 1925 5\' 1"  (1.549 m)     Head Circumference --      Peak Flow --      Pain Score 07/27/22 1925 0     Pain Loc --      Pain Edu? --      Excl. in [place]? --     Most recent vital signs: Vitals:   07/27/22 2142 07/27/22 2324  BP: 122/63 132/63  Pulse: 89 88  Resp: 17 19  Temp:  97.7 F (36.5 C)  SpO2: 97% 100%     General: Awake, no distress.  CV:  Good peripheral perfusion.  No murmurs to noted.  Regular rate and rhythm Resp:  Normal effort.  Clear bilaterally speaks with full clear sentences Abd:  No distention.  Soft nontender nondistended all quadrants Other:  No noted lower extremity edema.  Moves all extremities well without deficit   ED Results / Procedures / Treatments    Labs (all labs ordered are listed, but only abnormal results are displayed) Labs Reviewed  CBC - Abnormal; Notable for the following components:      Result Value   WBC 16.0 (*)    RBC 3.40 (*)    Hemoglobin 10.2 (*)    HCT 32.2 (*)    All other components within normal limits  BASIC METABOLIC PANEL - Abnormal; Notable for the following components:   Glucose, Bld 132 (*)    BUN 29 (*)    GFR, Estimated 56 (*)    All other components within normal limits  URINALYSIS, ROUTINE W REFLEX MICROSCOPIC - Abnormal; Notable for the following components:   Color, Urine YELLOW (*)    APPearance CLEAR (*)    Protein, ur 30 (*)    Bacteria, UA RARE (*)    All other components within normal limits  PROTIME-INR  APTT  TYPE AND SCREEN     EKG  Interpreted by me at 1930 heart rate 90 QRS 80 QTc 430 Normal sinus rhythm, probable LVH with repolarization abnormality.  No obvious acute ischemia   RADIOLOGY  CT head interpreted by me as negative for acute gross pathology  CT Head Wo Contrast  Result Date: 07/27/2022 CLINICAL DATA:  Syncope with altered mental status. EXAM: CT HEAD WITHOUT CONTRAST TECHNIQUE: Contiguous axial images were obtained from the base of the skull through the vertex without intravenous contrast. RADIATION DOSE REDUCTION: This exam was performed according to the departmental dose-optimization program which includes automated exposure control, adjustment of the mA and/or kV according to patient size and/or use of iterative reconstruction technique. COMPARISON:  July 14, 2022 FINDINGS: Brain: There is mild cerebral atrophy with widening of the extra-axial spaces and ventricular dilatation. There are areas of decreased attenuation within the white matter tracts of the supratentorial brain, consistent with microvascular disease changes. Vascular: There is marked severity bilateral cavernous carotid artery calcification. Skull: Normal. Negative for fracture or focal lesion.  Sinuses/Orbits: No acute finding. Other: None. IMPRESSION: 1. No acute intracranial abnormality. 2. Generalized cerebral atrophy with chronic white matter small vessel ischemic changes. Electronically Signed   By: Aram Candela M.D.   On: 07/27/2022 23:19     PROCEDURES:  Critical Care performed: No  Procedures   MEDICATIONS ORDERED IN ED: Medications  pantoprazole (PROTONIX) injection 40 mg (40 mg Intravenous Given 07/27/22 2339)     IMPRESSION / MDM / ASSESSMENT AND PLAN / ED COURSE  I reviewed the triage vital signs and the nursing notes.                              Differential diagnosis includes, but is not limited to, micturition syncope, vasovagal episode, arrhythmia, valvular abnormality, pulmonary embolism, GI bleeding, infection, acute metabolic abnormality, etc.  Broad differential.  Based on the patient's presentation report of dark stool, and on assessment by nursing noted to have heme positive stool with slight amount of red blood also present this appears to be possibly GI bleeding.  I suspect her syncope may be related to an associated GI bleed though at this time she is hemodynamically stable, and her hemoglobin of 10 is at her approximate baseline from previous labs that are albeit 7 years ago.  She is alert well oriented without evidence of acute injury.  As she does take Plavix and had syncope given the patient's age CT of the head was performed to exclude intracranial hemorrhage and this is negative.  No acute cardiopulmonary symptoms such as chest pain or dyspnea.  Reassuring examination, no clear risk factors for pulmonary embolism or symptoms of be suggestive of  Patient's presentation is most consistent with acute presentation with potential threat to life or bodily function.  The patient is on the cardiac monitor to evaluate for evidence of arrhythmia and/or significant heart rate changes.  ----------------------------------------- 11:56 PM on  07/27/2022 ----------------------------------------- Admission consult placed with and discussed with hospitalist Dr. Allena Katz     FINAL CLINICAL IMPRESSION(S) / ED DIAGNOSES   Final diagnoses:  Gastrointestinal hemorrhage, unspecified gastrointestinal hemorrhage type  Syncope and collapse     Rx / DC Orders   ED Discharge Orders     None        Note:  This document was prepared using Dragon voice recognition software and may include unintentional dictation errors.   Sharyn Creamer, MD 07/27/22 2358

## 2022-07-28 ENCOUNTER — Encounter: Payer: Self-pay | Admitting: Internal Medicine

## 2022-07-28 ENCOUNTER — Observation Stay (HOSPITAL_COMMUNITY)
Admit: 2022-07-28 | Discharge: 2022-07-28 | Disposition: A | Discharge status: Home / Self Care | Payer: Medicare Other | Attending: Internal Medicine | Admitting: Internal Medicine

## 2022-07-28 DIAGNOSIS — R55 Syncope and collapse: Secondary | ICD-10-CM

## 2022-07-28 DIAGNOSIS — Z7902 Long term (current) use of antithrombotics/antiplatelets: Secondary | ICD-10-CM | POA: Diagnosis not present

## 2022-07-28 DIAGNOSIS — Z96642 Presence of left artificial hip joint: Secondary | ICD-10-CM | POA: Diagnosis present

## 2022-07-28 DIAGNOSIS — R079 Chest pain, unspecified: Secondary | ICD-10-CM | POA: Diagnosis not present

## 2022-07-28 DIAGNOSIS — D649 Anemia, unspecified: Secondary | ICD-10-CM | POA: Diagnosis present

## 2022-07-28 DIAGNOSIS — Z96611 Presence of right artificial shoulder joint: Secondary | ICD-10-CM | POA: Diagnosis present

## 2022-07-28 DIAGNOSIS — D638 Anemia in other chronic diseases classified elsewhere: Secondary | ICD-10-CM | POA: Diagnosis present

## 2022-07-28 DIAGNOSIS — Z87891 Personal history of nicotine dependence: Secondary | ICD-10-CM | POA: Diagnosis not present

## 2022-07-28 DIAGNOSIS — Z96651 Presence of right artificial knee joint: Secondary | ICD-10-CM | POA: Diagnosis present

## 2022-07-28 DIAGNOSIS — Z9181 History of falling: Secondary | ICD-10-CM | POA: Diagnosis not present

## 2022-07-28 DIAGNOSIS — Z7982 Long term (current) use of aspirin: Secondary | ICD-10-CM | POA: Diagnosis not present

## 2022-07-28 DIAGNOSIS — Z66 Do not resuscitate: Secondary | ICD-10-CM | POA: Diagnosis present

## 2022-07-28 DIAGNOSIS — K921 Melena: Secondary | ICD-10-CM | POA: Diagnosis present

## 2022-07-28 DIAGNOSIS — R54 Age-related physical debility: Secondary | ICD-10-CM | POA: Diagnosis present

## 2022-07-28 DIAGNOSIS — M1990 Unspecified osteoarthritis, unspecified site: Secondary | ICD-10-CM | POA: Diagnosis present

## 2022-07-28 DIAGNOSIS — Z8673 Personal history of transient ischemic attack (TIA), and cerebral infarction without residual deficits: Secondary | ICD-10-CM | POA: Diagnosis not present

## 2022-07-28 DIAGNOSIS — E7800 Pure hypercholesterolemia, unspecified: Secondary | ICD-10-CM | POA: Diagnosis present

## 2022-07-28 DIAGNOSIS — M25472 Effusion, left ankle: Secondary | ICD-10-CM | POA: Diagnosis present

## 2022-07-28 DIAGNOSIS — Z8711 Personal history of peptic ulcer disease: Secondary | ICD-10-CM | POA: Diagnosis not present

## 2022-07-28 DIAGNOSIS — K5731 Diverticulosis of large intestine without perforation or abscess with bleeding: Secondary | ICD-10-CM | POA: Diagnosis present

## 2022-07-28 DIAGNOSIS — K219 Gastro-esophageal reflux disease without esophagitis: Secondary | ICD-10-CM | POA: Diagnosis present

## 2022-07-28 DIAGNOSIS — I10 Essential (primary) hypertension: Secondary | ICD-10-CM | POA: Diagnosis present

## 2022-07-28 DIAGNOSIS — I248 Other forms of acute ischemic heart disease: Secondary | ICD-10-CM | POA: Diagnosis present

## 2022-07-28 DIAGNOSIS — Z79899 Other long term (current) drug therapy: Secondary | ICD-10-CM | POA: Diagnosis not present

## 2022-07-28 DIAGNOSIS — D62 Acute posthemorrhagic anemia: Secondary | ICD-10-CM | POA: Diagnosis present

## 2022-07-28 DIAGNOSIS — Z85828 Personal history of other malignant neoplasm of skin: Secondary | ICD-10-CM | POA: Diagnosis not present

## 2022-07-28 LAB — CBC
HCT: 25.1 % — ABNORMAL LOW (ref 36.0–46.0)
Hemoglobin: 8.2 g/dL — ABNORMAL LOW (ref 12.0–15.0)
MCH: 30.7 pg (ref 26.0–34.0)
MCHC: 32.7 g/dL (ref 30.0–36.0)
MCV: 94 fL (ref 80.0–100.0)
Platelets: 275 10*3/uL (ref 150–400)
RBC: 2.67 MIL/uL — ABNORMAL LOW (ref 3.87–5.11)
RDW: 12.8 % (ref 11.5–15.5)
WBC: 12.5 10*3/uL — ABNORMAL HIGH (ref 4.0–10.5)
nRBC: 0 % (ref 0.0–0.2)

## 2022-07-28 LAB — COMPREHENSIVE METABOLIC PANEL
ALT: 10 U/L (ref 0–44)
AST: 16 U/L (ref 15–41)
Albumin: 2.8 g/dL — ABNORMAL LOW (ref 3.5–5.0)
Alkaline Phosphatase: 48 U/L (ref 38–126)
Anion gap: 5 (ref 5–15)
BUN: 30 mg/dL — ABNORMAL HIGH (ref 8–23)
CO2: 28 mmol/L (ref 22–32)
Calcium: 9.3 mg/dL (ref 8.9–10.3)
Chloride: 105 mmol/L (ref 98–111)
Creatinine, Ser: 0.87 mg/dL (ref 0.44–1.00)
GFR, Estimated: >60 mL/min (ref >60)
Glucose, Bld: 99 mg/dL (ref 70–99)
Potassium: 4.2 mmol/L (ref 3.5–5.1)
Sodium: 138 mmol/L (ref 135–145)
Total Bilirubin: 0.4 mg/dL (ref 0.3–1.2)
Total Protein: 6.4 g/dL — ABNORMAL LOW (ref 6.5–8.1)

## 2022-07-28 LAB — HEMOGLOBIN AND HEMATOCRIT, BLOOD
HCT: 20.9 % — ABNORMAL LOW (ref 36.0–46.0)
Hemoglobin: 6.8 g/dL — ABNORMAL LOW (ref 12.0–15.0)

## 2022-07-28 LAB — ECHOCARDIOGRAM COMPLETE
Area-P 1/2: 3.97 cm2
Calc EF: 60.2 %
Height: 61 in
P 1/2 time: 285 msec
S' Lateral: 1.7 cm
Single Plane A2C EF: 62.2 %
Single Plane A4C EF: 57.5 %
Weight: 1449.74 oz

## 2022-07-28 LAB — TSH: TSH: 1.851 u[IU]/mL (ref 0.350–4.500)

## 2022-07-28 LAB — BRAIN NATRIURETIC PEPTIDE: B Natriuretic Peptide: 126.4 pg/mL — ABNORMAL HIGH (ref 0.0–100.0)

## 2022-07-28 LAB — TROPONIN I (HIGH SENSITIVITY): Troponin I (High Sensitivity): 16 ng/L (ref <18)

## 2022-07-28 LAB — PREPARE RBC (CROSSMATCH)

## 2022-07-28 MED ORDER — NITROGLYCERIN 0.4 MG SL SUBL: 0.4000 mg | SUBLINGUAL_TABLET | SUBLINGUAL | Status: DC | PRN

## 2022-07-28 MED ORDER — HYDRALAZINE HCL 20 MG/ML IJ SOLN: 10.0000 mg | INTRAMUSCULAR | Status: DC | PRN

## 2022-07-28 MED ORDER — SODIUM CHLORIDE 0.9 % IV SOLN
INTRAVENOUS | Status: AC
  Administered 2022-07-28: 30 mL/h via INTRAVENOUS

## 2022-07-28 MED ORDER — HEPARIN SODIUM (PORCINE) 5000 UNIT/ML IJ SOLN: 5000.0000 [IU] | Freq: Three times a day (TID) | INTRAMUSCULAR | Status: DC

## 2022-07-28 MED ORDER — PANTOPRAZOLE SODIUM 40 MG IV SOLR
40.0000 mg | Freq: Two times a day (BID) | INTRAVENOUS | Status: DC
  Administered 2022-07-28 – 2022-07-29 (×3): 40 mg via INTRAVENOUS
  Filled 2022-07-28 (×3): qty 10

## 2022-07-28 MED ORDER — METOPROLOL TARTRATE 5 MG/5ML IV SOLN: 5.0000 mg | INTRAVENOUS | Status: DC | PRN

## 2022-07-28 MED ORDER — SODIUM CHLORIDE 0.9% FLUSH
3.0000 mL | Freq: Two times a day (BID) | INTRAVENOUS | Status: DC
  Administered 2022-07-28 – 2022-07-31 (×8): 3 mL via INTRAVENOUS

## 2022-07-28 MED ORDER — IPRATROPIUM-ALBUTEROL 0.5-2.5 (3) MG/3ML IN SOLN: 3.0000 mL | RESPIRATORY_TRACT | Status: DC | PRN

## 2022-07-28 MED ORDER — ACETAMINOPHEN 325 MG PO TABS
650.0000 mg | ORAL_TABLET | Freq: Four times a day (QID) | ORAL | Status: DC | PRN
  Administered 2022-07-28: 650 mg via ORAL
  Filled 2022-07-28: qty 2

## 2022-07-28 MED ORDER — ACETAMINOPHEN 650 MG RE SUPP: 650.0000 mg | Freq: Four times a day (QID) | RECTAL | Status: DC | PRN

## 2022-07-28 MED ORDER — SODIUM CHLORIDE 0.9% IV SOLUTION: Freq: Once | INTRAVENOUS | Status: AC

## 2022-07-28 MED ORDER — SENNOSIDES-DOCUSATE SODIUM 8.6-50 MG PO TABS: 1.0000 | ORAL_TABLET | Freq: Every evening | ORAL | Status: DC | PRN

## 2022-07-28 MED ORDER — GUAIFENESIN 100 MG/5ML PO LIQD: 5.0000 mL | ORAL | Status: DC | PRN

## 2022-07-28 MED ORDER — ONDANSETRON HCL 4 MG/2ML IJ SOLN: 4.0000 mg | Freq: Four times a day (QID) | INTRAMUSCULAR | Status: DC | PRN

## 2022-07-28 MED ORDER — PANTOPRAZOLE SODIUM 40 MG IV SOLR
40.0000 mg | Freq: Once | INTRAVENOUS | Status: AC
Start: 2022-07-28 — End: 2022-07-28
  Administered 2022-07-28: 40 mg via INTRAVENOUS
  Filled 2022-07-28: qty 10

## 2022-07-28 MED ORDER — TRAZODONE HCL 50 MG PO TABS
50.0000 mg | ORAL_TABLET | Freq: Every evening | ORAL | Status: DC | PRN
  Administered 2022-07-31: 50 mg via ORAL
  Filled 2022-07-28: qty 1

## 2022-07-28 NOTE — Progress Notes (Signed)
Provider Notification: Stephania Fragmin MD   Pt informed RN 5 minutes ago she felt like her heart was beating really fast and she was having some chest pain/palpitations . chest pain/palpitations have resolved. HR is 101 and BP 155/49  Also, pt just had another BM and GI bleed seem to be increasing.   Recommendations: To keep monitoring

## 2022-07-28 NOTE — Assessment & Plan Note (Signed)
    Latest Ref Rng & Units 07/27/2022    9:36 PM 08/06/2014    4:20 AM 07/25/2014   10:09 AM  CBC  WBC 4.0 - 10.5 K/uL 16.0  9.3  11.2   Hemoglobin 12.0 - 15.0 g/dL 10.9  9.9  32.3   Hematocrit 36.0 - 46.0 % 32.2  29.9  40.1   Platelets 150 - 400 K/uL 346  189  295    Suspect acute on chronic. Will defer to am team for GI consult . Type/screen/ IV PPI.

## 2022-07-28 NOTE — Consult Note (Signed)
Inpatient Consultation   Patient ID: Debbie Foster is a 88 y.o. female.  Requesting Provider: Dimple Nanas, MD  Date of Admission: 07/27/2022  Date of Consult: 07/28/22   Reason for Consultation: hematochezia, anemia   Patient's Chief Complaint:   Chief Complaint  Patient presents with   Fall   Loss of Consciousness    Patient 88 year old Caucasian female on dual antiplatelet therapy (plavix and asa), history of ocular stroke, diverticulitis, cardiac murmur, blood dyscrasia who presents to the hospital with syncopal episode.  Gastroenterology is consulted for anemia with reported melena and clots in the stool.  Her 2 daughters accompany her at bedside during evaluation. Patient had a syncopal episode with loss of consciousness without head strike.  Vital signs of been stable since arrival.  Nursing has noted some clots passing with stool today.  Hemoglobin on presentation was 10.2 which is near her baseline.  After IV fluid resuscitation the patient had a global drop in her cell lines with hemoglobin 8.2.  Her last dose of ASA/Plavix was on Wednesday.  Prior to that for about a week she had noted some dark stools denies any Pepto or iron use.  BUN/creatinine ratio elevated at 34. INR 1.2.   She is currently resting in chair comfortably.  Denies abdominal pain nausea vomiting chest pain or shortness of breath. She denies any NSAID use.  Emphysematous changes noted on chest imaging CT of the head performed with ischemic changes and atrophy.  On asa 81mg  and plavix (last dose 07/27/22)  Denies NSAIDs and anticoagulants Denies family history of gastrointestinal disease and malignancy Previous Endoscopies: None   Past Medical History:  Diagnosis Date   Arthritis    "just about q bone in my body"   Blood dyscrasia    Bronchitis 11/2013   "first time I've ever had it; still coughing from it now" (08/05/2014)   Diverticulitis 2004   Double vision 2012   "left eye; from the  eye stroke; can see fine w/my glasses"   GERD (gastroesophageal reflux disease)    h/o uses OTC   Heart murmur    High cholesterol    Hypertension    Migraine    "stopped w/the change of life; age 75"   Squamous carcinoma    arms and face   Stroke (HCC)    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    Past Surgical History:  Procedure Laterality Date   CARPAL TUNNEL RELEASE Right    CATARACT EXTRACTION W/ INTRAOCULAR LENS IMPLANT Right 2003   CATARACT EXTRACTION W/ INTRAOCULAR LENS IMPLANT Left 2006   JOINT REPLACEMENT     REVERSE SHOULDER ARTHROPLASTY Right 08/05/2014   REVERSE SHOULDER ARTHROPLASTY Right 08/05/2014   Procedure: REVERSE SHOULDER ARTHROPLASTY;  Surgeon: 10/05/2014, MD;  Location: [facility];  Service: Orthopedics;  Laterality: Right;  Right reverse total shoulder arthroplasty   TONSILLECTOMY     TOTAL HIP ARTHROPLASTY Left 1998   TOTAL KNEE ARTHROPLASTY Right 2005    No Known Allergies  History reviewed. No pertinent family history.  Social History   Tobacco Use   Smoking status: Former    Packs/day: 0.00    Types: Cigarettes   Smokeless tobacco: Never   Tobacco comments:    "smoked less than 1 pack of cigarettes in my life; didn't like it"  Substance Use Topics   Alcohol use: No   Drug use: No     Pertinent GI related history and allergies were reviewed  with the patient  Review of Systems  Constitutional:  Negative for activity change, appetite change, chills, diaphoresis, fatigue, fever and unexpected weight change.  HENT:  Negative for trouble swallowing and voice change.   Respiratory:  Negative for shortness of breath and wheezing.   Cardiovascular:  Negative for chest pain, palpitations and leg swelling.  Gastrointestinal:  Positive for blood in stool. Negative for abdominal distention, abdominal pain, anal bleeding, constipation, diarrhea, nausea, rectal pain and vomiting.  Musculoskeletal:  Positive for arthralgias and myalgias.  Skin:   Negative for color change and pallor.  Neurological:  Positive for syncope and weakness. Negative for dizziness.  Psychiatric/Behavioral:  Negative for agitation and confusion.   All other systems reviewed and are negative.    Medications Home Medications No current facility-administered medications on file prior to encounter.   Current Outpatient Medications on File Prior to Encounter  Medication Sig Dispense Refill   aspirin 81 MG tablet Take 81 mg by mouth daily.     Cholecalciferol (VITAMIN D3) 1000 UNITS CAPS Take 1,000 Units by mouth daily.     clopidogrel (PLAVIX) 75 MG tablet Take 75 mg by mouth daily.     co-enzyme Q-10 30 MG capsule Take 60 mg by mouth daily.     diltiazem (CARDIZEM) 120 MG tablet Take 1 tablet by mouth daily. (Patient not taking: Reported on 07/28/2022)     metoprolol succinate (TOPROL-XL) 50 MG 24 hr tablet Take 50 mg by mouth daily.  (Patient not taking: Reported on 07/28/2022)     Multiple Vitamins-Minerals (MULTIVITAMIN WITH MINERALS) tablet Take 1 tablet by mouth daily.     nitroGLYCERIN (NITROSTAT) 0.4 MG SL tablet Place 0.4 mg under the tongue every 5 (five) minutes as needed.      Olmesartan-amLODIPine-HCTZ 40-10-12.5 MG TABS Take 1 tablet by mouth once daily. daily     simvastatin (ZOCOR) 40 MG tablet Take 40 mg by mouth at bedtime.     trolamine salicylate (ASPERCREME) 10 % cream Apply 1 application topically as needed for muscle pain.     Pertinent GI related medications were reviewed with the patient  Inpatient Medications  Current Facility-Administered Medications:    0.9 %  sodium chloride infusion, , Intravenous, Continuous, Gertha Calkin, MD, Last Rate: 30 mL/hr at 07/28/22 0310, 30 mL/hr at 07/28/22 0310   acetaminophen (TYLENOL) tablet 650 mg, 650 mg, Oral, Q6H PRN **OR** acetaminophen (TYLENOL) suppository 650 mg, 650 mg, Rectal, Q6H PRN, Gertha Calkin, MD   guaiFENesin (ROBITUSSIN) 100 MG/5ML liquid 5 mL, 5 mL, Oral, Q4H PRN, Amin, Ankit  Chirag, MD   hydrALAZINE (APRESOLINE) injection 10 mg, 10 mg, Intravenous, Q4H PRN, Amin, Ankit Chirag, MD   ipratropium-albuterol (DUONEB) 0.5-2.5 (3) MG/3ML nebulizer solution 3 mL, 3 mL, Nebulization, Q4H PRN, Amin, Ankit Chirag, MD   metoprolol tartrate (LOPRESSOR) injection 5 mg, 5 mg, Intravenous, Q4H PRN, Amin, Ankit Chirag, MD   ondansetron (ZOFRAN) injection 4 mg, 4 mg, Intravenous, Q6H PRN, Amin, Ankit Chirag, MD   pantoprazole (PROTONIX) injection 40 mg, 40 mg, Intravenous, Q12H, Amin, Ankit Chirag, MD   senna-docusate (Senokot-S) tablet 1 tablet, 1 tablet, Oral, QHS PRN, Amin, Ankit Chirag, MD   sodium chloride flush (NS) 0.9 % injection 3 mL, 3 mL, Intravenous, Q12H, Irena Cords V, MD, 3 mL at 07/28/22 0310   traZODone (DESYREL) tablet 50 mg, 50 mg, Oral, QHS PRN, Amin, Ankit Chirag, MD  sodium chloride 30 mL/hr (07/28/22 0310)    acetaminophen **OR** acetaminophen, guaiFENesin, hydrALAZINE, ipratropium-albuterol,  metoprolol tartrate, ondansetron (ZOFRAN) IV, senna-docusate, traZODone   Objective   Vitals:   07/28/22 0200 07/28/22 0428 07/28/22 0500 07/28/22 0742  BP: (!) 131/57 (!) 169/62  (!) 150/79  Pulse: 81 87  90  Resp: 20 17  12   Temp:  97.6 F (36.4 C)  98 F (36.7 C)  TempSrc:      SpO2: 98% 98%  98%  Weight:   41.1 kg   Height:         Physical Exam Vitals and nursing note reviewed.  Constitutional:      General: She is not in acute distress.    Appearance: She is not ill-appearing, toxic-appearing or diaphoretic.     Comments: Elderly, frail appearing  HENT:     Head: Normocephalic and atraumatic.     Ears:     Comments: HOH    Nose: Nose normal.     Mouth/Throat:     Mouth: Mucous membranes are moist.     Pharynx: Oropharynx is clear.  Eyes:     General: No scleral icterus.    Extraocular Movements: Extraocular movements intact.  Cardiovascular:     Rate and Rhythm: Normal rate and regular rhythm.     Heart sounds: Murmur heard.     No  friction rub. No gallop.  Pulmonary:     Effort: Pulmonary effort is normal. No respiratory distress.     Breath sounds: Normal breath sounds. No wheezing, rhonchi or rales.  Abdominal:     General: Bowel sounds are normal. There is no distension.     Palpations: Abdomen is soft.     Tenderness: There is no abdominal tenderness. There is no guarding or rebound.  Musculoskeletal:        General: Tenderness (LLE from recent fall) present.     Cervical back: Neck supple.     Right lower leg: No edema.     Left lower leg: No edema.  Skin:    General: Skin is warm and dry.     Coloration: Skin is not jaundiced or pale.  Neurological:     Mental Status: She is alert. Mental status is at baseline.  Psychiatric:        Mood and Affect: Mood normal.        Behavior: Behavior normal.        Thought Content: Thought content normal.        Judgment: Judgment normal.     Laboratory Data Recent Labs  Lab 07/27/22 2136 07/28/22 0514  WBC 16.0* 12.5*  HGB 10.2* 8.2*  HCT 32.2* 25.1*  PLT 346 275   Recent Labs  Lab 07/27/22 2136 07/28/22 0514  NA 137 138  K 4.2 4.2  CL 104 105  CO2 24 28  BUN 29* 30*  CALCIUM 9.6 9.3  PROT  --  6.4*  BILITOT  --  0.4  ALKPHOS  --  48  ALT  --  10  AST  --  16  GLUCOSE 132* 99   Recent Labs  Lab 07/27/22 2136  INR 1.2    No results for input(s): "LIPASE" in the last 72 hours.      Imaging Studies: CT Head Wo Contrast  Result Date: 07/27/2022 CLINICAL DATA:  Syncope with altered mental status. EXAM: CT HEAD WITHOUT CONTRAST TECHNIQUE: Contiguous axial images were obtained from the base of the skull through the vertex without intravenous contrast. RADIATION DOSE REDUCTION: This exam was performed according to the departmental dose-optimization program which includes  automated exposure control, adjustment of the mA and/or kV according to patient size and/or use of iterative reconstruction technique. COMPARISON:  July 14, 2022 FINDINGS:  Brain: There is mild cerebral atrophy with widening of the extra-axial spaces and ventricular dilatation. There are areas of decreased attenuation within the white matter tracts of the supratentorial brain, consistent with microvascular disease changes. Vascular: There is marked severity bilateral cavernous carotid artery calcification. Skull: Normal. Negative for fracture or focal lesion. Sinuses/Orbits: No acute finding. Other: None. IMPRESSION: 1. No acute intracranial abnormality. 2. Generalized cerebral atrophy with chronic white matter small vessel ischemic changes. Electronically Signed   By: Aram Candela M.D.   On: 07/27/2022 23:19    Assessment:   # Melena and hematochezia -Patient presented at baseline but did have global drop in cell lines with IV fluid resuscitation -hgb 10 --> 8.2 -Vital signs have remained stable -BUN/creatinine ratio is elevated -Chronic DAPT use last dose of Plavix on Wednesday 07/27/22 - no other nsaid use - ddx is broad- UGI source considered but not limited to ulcers and avms - however, VSS, although bun cr ratio elevated and report of dark stool; LGIB could be hemorrhoidal, diverticular, avm, malignancy- some of which are self limited in nature. No abdominal pain- ischemia less likely  # syncopal episode  # normocytic anemia  # h/o diverticulitis  # DAPT use  Plan:   Discussed case with daughters and hospitalist.  Given patient's age and frailty, discussed risks and benefits of bidirectional endoscopy with the patient and her family members. They agree with conservative management at this time- medication, monitoring vital signs and labs and further signs of bleeding/decompensation - expect some blood to wash out with BM- more impt to pay close attn to vital signs and labs in this scenario -Should patient become hemodynamically unstable recommend CTA to assess bleeding location and may benefit from IR intervention over sedation and endoscopy at that  time  Also discussed potential for missed lesions if no scope, family comfortable with this as pt would not want intervention if ie colonic malignancy found  - plan is currently for plavix to be discontinued. OK to continue asa 81 mg upon discharge (Plavix would need to be held for 4 to 5 days prior to any procedure regardless) Protonix 40 mg iv q12 h- to transition to PO bid upon discharge Hold dvt ppx Monitor H&H.  Transfusion and resuscitation as per primary team Avoid frequent lab draws to prevent lab induced anemia Supportive care and antiemetics as per primary team Maintain two sites IV access Avoid nsaids Monitor for GIB.  Stool occult blood test has no role in evaluation of anemia and is a test for colon cancer screening and is inappropriate to be used for evaluation of anemia/gastrointestinal bleeding, as a negative or positive test would not change evaluation.  We will continue to follow  I personally performed the service.  Management of other medical comorbidities as per primary team  Thank you for allowing [place] to participate in this patient's care. Please don't hesitate to call if any questions or concerns arise.   Jaynie Collins, DO [facility]  Portions of the record may have been created with voice recognition software. Occasional wrong-word or 'sound-a-like' substitutions may have occurred due to the inherent limitations of voice recognition software.  Read the chart carefully and recognize, using context, where substitutions may have occurred.

## 2022-07-28 NOTE — Progress Notes (Addendum)
[employer] Nurse called with concerns for left facial droop Bedside patient states she does not feel any different.  Mild inconsistent left facial labial flattening that is not consistent if different head positions. Speech clear, tongue midline/ Vision and hearing impaired baseline,  No swallowing problems UE strength equal LE strength grossly equal, unable to assess left fot due to ankle bruise Sensation grossly in tact Patient states she feels better than she did today CT and MRI done earlier today 07/28/22 without acute findings  Most recent hemoglobin 6.8. No further bloody stools reported since 6 pm. Risks/benefits of blood transfusion reviewed with patient. Patient agreed to transfusion and one unit ordered  Donnie Mesa NP [facility]  UPDATE  2359 PM 07/28/22 Patient compllained of chest pain left side 6/10 without radiation or aaccompanying symptoms Initial EKG SR with PVCs and T wave inversion with ST depression in lateral leads. Not noted on prior EKG. No reciprocal changes Echo done  today 07/28/22   1. Left ventricular ejection fraction, by estimation, is 60 to 65%. The  left ventricle has normal function. The left ventricle has no regional  wall motion abnormalities. Left ventricular diastolic parameters are  consistent with Grade I diastolic  dysfunction (impaired relaxation).   2. Right ventricular systolic function is normal. The right ventricular  size is normal. There is normal pulmonary artery systolic pressure. The  estimated right ventricular systolic pressure is 28.6 mmHg.   3. The mitral valve is normal in structure. Mild mitral valve  regurgitation. No evidence of mitral stenosis.   4. Tricuspid valve regurgitation is mild to moderate.   5. The aortic valve is normal in structure. There is moderate  calcification of the aortic valve. Aortic valve regurgitation is mild.  Aortic valve sclerosis/calcification is present, without any  evidence of  aortic stenosis.   6. The inferior vena cava is normal in size with greater than 50%  respiratory variability, suggesting right atrial pressure of 3 mmHg.  Trend troponin, BNP Serial EKG Dose of acetaminophen given  0519 am 07/29/22 Iniital BNP mildLY elevated 126  Trop was normal 16, now trendeing up 34 - 59 Repeat EKG showED  T wave and ST changes in the lateral leads no longer present - chast pain resolved PRBC transfusion completed and awaiting repeat CBC - hgb 7.9  Donnie Mesa NP [facility]

## 2022-07-28 NOTE — Assessment & Plan Note (Signed)
Fall precautions.  

## 2022-07-28 NOTE — Assessment & Plan Note (Signed)
We will admit patient for observation status for syncope and collapse suspected secondary to GI bleed. Fall precautions, aspiration precautions. MRI of the brain noncontrast. Continuous monitoring with EEG.

## 2022-07-28 NOTE — Progress Notes (Signed)
PROGRESS NOTE    Debbie Foster  QZE:[id number] DOB: 06-17-1925 DOA: 07/27/2022 PCP: Alan Mulder, MD   Brief Narrative:  88 year old with past medical history of GI bleed, HTN, HLD, SCC of skin, GERD, CVA comes to the hospital after having an episode of syncope in the bathroom.  She also reports of dark stools and has been on Plavix.  She pressed her life alert button during this event and EMS brought her to the hospital.  In the ED her hemoglobin was 10, stool guaiac was positive, CT head was negative.   Assessment & Plan:  Principal Problem:   Syncope and collapse Active Problems:   Anemia   At risk for falls   Gastrointestinal hemorrhage with melena     Assessment and Plan: * Syncope and collapse Suspect from underlying bleeding versus vasovagal episode.  CTH/C-spine = negative. XR shows some left ankle swelling. Hemoglobin stable we will continue to monitor.  Baseline 10.0.  Discussed with Dr. Timothy Lasso from GI, will plan for conservative management with PPI for 8 weeks.  Monitor hemoglobin.  Will discontinue Plavix going forward as there is no clear indication for this.  Continue aspirin upon discharge. - Echocardiogram - TSH -Discontinue subcu heparin.  Will place on SCDs -PT/OT   Anemia of Chronic Disease  Acute Blood Loss Anemia -Anemia of chronic disease. Baseline Hb 1.. HB today 10.2 > 8.2 (diluational, all cell lines down). Will monitor.   Essential HTN -Currently home p.o. medications on hold.  We will place her on IV as needed  HLD -Statin  Hx of CVA -On home aspirin Plavix which is currently on hold  Hx of SCC of her Skin -Seen by dermatology back in 2016.  Appears to be stable   At risk for falls Fall precautions.    DVT prophylaxis: SCDs Code Status: Full code Family Communication: Family at bedside  Maintain hospital stay to closely monitor hemoglobin level and trend.   Subjective: Seen and examined at bedside, does not have any complaints  this morning.  Had couple of episodes of bowel movement with some blood clots overnight.  I had extensive discussion with the patient and family members.  She does not have clear history of stroke and was empirically placed on aspirin and Plavix by her PCP.  She also tells me that she had been taking daily BC powder for many years but quit about 4 months ago.    Examination:  General exam: Appears calm and comfortable, elderly frail Respiratory system: Clear to auscultation. Respiratory effort normal. Cardiovascular system: S1 & S2 heard, RRR. No JVD, murmurs, rubs, gallops or clicks. No pedal edema. Gastrointestinal system: Abdomen is nondistended, soft and nontender. No organomegaly or masses felt. Normal bowel sounds heard. Central nervous system: Alert and oriented. No focal neurological deficits. Extremities: Symmetric 5 x 5 power. Skin: No rashes, lesions or ulcers Psychiatry: Judgement and insight appear normal. Mood & affect appropriate.     Objective: Vitals:   07/28/22 0100 07/28/22 0200 07/28/22 0428 07/28/22 0500  BP: (!) 129/55 (!) 131/57 (!) 169/62   Pulse: 84 81 87   Resp: 18 20 17    Temp:   97.6 F (36.4 C)   TempSrc:      SpO2: 99% 98% 98%   Weight:    41.1 kg  Height:        Intake/Output Summary (Last 24 hours) at 07/28/2022 0730 Last data filed at 07/28/2022 0612 Gross per 24 hour  Intake 90.9 ml  Output --  Net 90.9 ml   Filed Weights   07/27/22 1925 07/28/22 0500  Weight: 39.9 kg 41.1 kg     Data Reviewed:   CBC: Recent Labs  Lab 07/27/22 2136 07/28/22 0514  WBC 16.0* 12.5*  HGB 10.2* 8.2*  HCT 32.2* 25.1*  MCV 94.7 94.0  PLT 346 275   Basic Metabolic Panel: Recent Labs  Lab 07/27/22 2136 07/28/22 0514  NA 137 138  K 4.2 4.2  CL 104 105  CO2 24 28  GLUCOSE 132* 99  BUN 29* 30*  CREATININE 0.94 0.87  CALCIUM 9.6 9.3   GFR: Estimated Creatinine Clearance: 25.1 mL/min (by C-G formula based on SCr of 0.87 mg/dL). Liver  Function Tests: Recent Labs  Lab 07/28/22 0514  AST 16  ALT 10  ALKPHOS 48  BILITOT 0.4  PROT 6.4*  ALBUMIN 2.8*   No results for input(s): "LIPASE", "AMYLASE" in the last 168 hours. No results for input(s): "AMMONIA" in the last 168 hours. Coagulation Profile: Recent Labs  Lab 07/27/22 2136  INR 1.2   Cardiac Enzymes: No results for input(s): "CKTOTAL", "CKMB", "CKMBINDEX", "TROPONINI" in the last 168 hours. BNP (last 3 results) No results for input(s): "PROBNP" in the last 8760 hours. HbA1C: No results for input(s): "HGBA1C" in the last 72 hours. CBG: No results for input(s): "GLUCAP" in the last 168 hours. Lipid Profile: No results for input(s): "CHOL", "HDL", "LDLCALC", "TRIG", "CHOLHDL", "LDLDIRECT" in the last 72 hours. Thyroid Function Tests: No results for input(s): "TSH", "T4TOTAL", "FREET4", "T3FREE", "THYROIDAB" in the last 72 hours. Anemia Panel: No results for input(s): "VITAMINB12", "FOLATE", "FERRITIN", "TIBC", "IRON", "RETICCTPCT" in the last 72 hours. Sepsis Labs: No results for input(s): "PROCALCITON", "LATICACIDVEN" in the last 168 hours.  No results found for this or any previous visit (from the past 240 hour(s)).       Radiology Studies: CT Head Wo Contrast  Result Date: 07/27/2022 CLINICAL DATA:  Syncope with altered mental status. EXAM: CT HEAD WITHOUT CONTRAST TECHNIQUE: Contiguous axial images were obtained from the base of the skull through the vertex without intravenous contrast. RADIATION DOSE REDUCTION: This exam was performed according to the departmental dose-optimization program which includes automated exposure control, adjustment of the mA and/or kV according to patient size and/or use of iterative reconstruction technique. COMPARISON:  July 14, 2022 FINDINGS: Brain: There is mild cerebral atrophy with widening of the extra-axial spaces and ventricular dilatation. There are areas of decreased attenuation within the white matter tracts  of the supratentorial brain, consistent with microvascular disease changes. Vascular: There is marked severity bilateral cavernous carotid artery calcification. Skull: Normal. Negative for fracture or focal lesion. Sinuses/Orbits: No acute finding. Other: None. IMPRESSION: 1. No acute intracranial abnormality. 2. Generalized cerebral atrophy with chronic white matter small vessel ischemic changes. Electronically Signed   By: Aram Candela M.D.   On: 07/27/2022 23:19        Scheduled Meds:  heparin  5,000 Units Subcutaneous Q8H   sodium chloride flush  3 mL Intravenous Q12H   Continuous Infusions:  sodium chloride 30 mL/hr (07/28/22 0310)     LOS: 0 days   Time spent= 35 mins    Koran Seabrook Joline Maxcy, MD [facility]  If 7PM-7AM, please contact night-coverage  07/28/2022, 7:30 AM

## 2022-07-28 NOTE — H&P (Signed)
History and Physical    Debbie Foster LNL:[id number] DOB: October 01, 1925 DOA: 07/27/2022  PCP: Alan Mulder, MD    Patient coming from:  Home    Chief Complaint:  Syncope/ collapse   HPI:  Debbie Foster is a 88 y.o. female seen in ed with complaints of passing out in her bathroom. Pt reports LOC. 88 y/o female . Syncopal episode.  Dark stool today am and is Haem positive.  Plavix+. ? H/O PUD. Pt was going to bathroom and passed out.  She pressed the life alert button.  Daughter who lives next door got in called EMS. Pt recently fell and injured her left ankle but was told not fracture.   ED Course:   Vitals:   07/27/22 2324 07/28/22 0044 07/28/22 0100 07/28/22 0200  BP: 132/63 138/61 (!) 129/55 (!) 131/57  Pulse: 88 90 84 81  Resp: 19 (!) 21 18 20   Temp: 97.7 F (36.5 C) 97.8 F (36.6 C)    TempSrc: Oral Oral    SpO2: 100% 97% 99% 98%  Weight:      Height:      In the emergency room patient is alert awake oriented cooperative O2 sats of 97% on room air. BMP shows a glucose of 132 normal kidney function with a creatinine of 0.94.  CBC shows anemia with a hemoglobin of 10.2 CBC shows white count of 16.0 hemoglobin of 10.2, urinalysis shows yellow clear urine. Patient found to be anemic in the emergency room and stool guaiac done was positive. Initial head CT done showed no acute intracranial abnormality.   Review of Systems:  Review of Systems  Reason unable to perform ROS: hearing difficulty.  Constitutional: Negative.   HENT: Negative.    Eyes: Negative.   Respiratory: Negative.    Cardiovascular:  Negative for chest pain, palpitations, orthopnea, claudication, leg swelling and PND.  Gastrointestinal: Negative.   Genitourinary: Negative.   Musculoskeletal: Negative.   Skin: Negative.   Neurological: Negative.     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    Past Surgical History:  Procedure Laterality Date   CARPAL TUNNEL RELEASE Right    CATARACT EXTRACTION W/ INTRAOCULAR LENS IMPLANT Right 2003   CATARACT EXTRACTION W/ INTRAOCULAR LENS IMPLANT Left 2006   JOINT REPLACEMENT     REVERSE SHOULDER ARTHROPLASTY Right 08/05/2014   REVERSE SHOULDER ARTHROPLASTY Right 08/05/2014   Procedure: REVERSE SHOULDER ARTHROPLASTY;  Surgeon: 10/05/2014, MD;  Location: [facility] OR;  Service: Orthopedics;  Laterality: Right;  Right reverse total shoulder arthroplasty   TONSILLECTOMY     TOTAL HIP ARTHROPLASTY Left 1998   TOTAL KNEE ARTHROPLASTY Right 2005     reports that she has quit smoking. Her smoking use included cigarettes. She has never used smokeless tobacco. She reports  that she does not drink alcohol and does not use drugs.  No Known Allergies  History reviewed. No pertinent family history.  Prior to Admission medications   Medication Sig Start Date End Date Taking? Authorizing Provider  aspirin 81 MG tablet Take 81 mg by mouth daily.    [provider]  Cholecalciferol (VITAMIN D3) 1000 UNITS CAPS Take 1,000 Units by mouth daily.    [provider]  clopidogrel (PLAVIX) 75 MG tablet Take 75 mg by mouth daily.    [provider]  co-enzyme Q-10 30 MG capsule Take 60 mg by mouth daily.    [provider]  diltiazem (CARDIZEM) 120 MG tablet Take 1 tablet by mouth daily. 02/10/16   [provider]  metoprolol succinate (TOPROL-XL) 50 MG 24 hr tablet Take 50 mg by mouth daily.      [provider]  Multiple Vitamins-Minerals (MULTIVITAMIN WITH MINERALS) tablet Take 1 tablet by mouth daily.    [provider]  nitroGLYCERIN (NITROSTAT) 0.4 MG SL tablet Place 0.4 mg under the tongue every 5 (five) minutes as needed.  03/02/16   [provider]  Olmesartan-Amlodipine-HCTZ (TRIBENZOR) 40-10-25 MG TABS Take 1 tablet by mouth every morning.    [provider]  simvastatin (ZOCOR) 40 MG tablet Take 40 mg by mouth at bedtime.    [provider]  trolamine salicylate (ASPERCREME) 10 % cream Apply 1 application topically as needed for muscle pain.    [provider]    Physical Exam: Vitals:   07/27/22 2324 07/28/22 0044 07/28/22 0100 07/28/22 0200  BP: 132/63 138/61 (!) 129/55 (!) 131/57  Pulse: 88 90 84 81  Resp: 19 (!) 21 18 20   Temp: 97.7 F (36.5 C) 97.8 F (36.6 C)    TempSrc: Oral Oral    SpO2: 100% 97% 99% 98%  Weight:      Height:       Physical Exam Vitals and nursing note reviewed.  Constitutional:      Appearance: She is not ill-appearing, toxic-appearing or diaphoretic.  HENT:     Head: Normocephalic and atraumatic.     Right Ear: Hearing and external ear normal.     Left Ear: Hearing and external ear normal.     Nose: No nasal deformity.     Mouth/Throat:     Lips: Pink.     Tongue: No lesions.  Eyes:     Extraocular Movements: Extraocular movements intact.     Pupils: Pupils are equal, round, and reactive to light.  Cardiovascular:     Rate and Rhythm: Normal rate. Rhythm irregular.     Pulses: Normal pulses.     Heart sounds: Normal heart sounds.  Pulmonary:     Effort: Pulmonary effort is normal.     Breath sounds: Normal breath sounds.  Abdominal:     General: Bowel sounds are normal. There is no distension.     Palpations: Abdomen is soft. There is no mass.     Tenderness: There is no abdominal tenderness. There is no guarding.     Hernia: No hernia is present.  Musculoskeletal:      Right lower leg: No edema.     Left lower leg: No edema.  Skin:    General: Skin is warm.  Neurological:     General: No focal deficit present.     Mental Status: She is alert and oriented to person, place, and time.     Cranial Nerves: Cranial nerves 2-12  are intact.     Motor: Motor function is intact.  Psychiatric:        Attention and Perception: Attention normal.        Mood and Affect: Mood normal.        Speech: Speech normal.        Behavior: Behavior normal. Behavior is cooperative.        Cognition and Memory: Cognition normal.    Labs on Admission: I have personally reviewed following labs and imaging studies Results for orders placed or performed during the hospital encounter of 07/27/22 (from the past 24 hour(s))  CBC     Status: Abnormal   Collection Time: 07/27/22  9:36 PM  Result Value Ref Range   WBC 16.0 (H) 4.0 - 10.5 K/uL   RBC 3.40 (L) 3.87 - 5.11 MIL/uL   Hemoglobin 10.2 (L) 12.0 - 15.0 g/dL   HCT 99.3 (L) 57.0 - 17.7 %   MCV 94.7 80.0 - 100.0 fL   MCH 30.0 26.0 - 34.0 pg   MCHC 31.7 30.0 - 36.0 g/dL   RDW 93.9 03.0 - 09.2 %   Platelets 346 150 - 400 K/uL   nRBC 0.0 0.0 - 0.2 %  Basic metabolic panel     Status: Abnormal   Collection Time: 07/27/22  9:36 PM  Result Value Ref Range   Sodium 137 135 - 145 mmol/L   Potassium 4.2 3.5 - 5.1 mmol/L   Chloride 104 98 - 111 mmol/L   CO2 24 22 - 32 mmol/L   Glucose, Bld 132 (H) 70 - 99 mg/dL   BUN 29 (H) 8 - 23 mg/dL   Creatinine, Ser 3.30 0.44 - 1.00 mg/dL   Calcium 9.6 8.9 - 07.6 mg/dL   GFR, Estimated 56 (L) >60 mL/min   Anion gap 9 5 - 15  Urinalysis, Routine w reflex microscopic     Status: Abnormal   Collection Time: 07/27/22  9:36 PM  Result Value Ref Range   Color, Urine YELLOW (A) YELLOW   APPearance CLEAR (A) CLEAR   Specific Gravity, Urine 1.018 1.005 - 1.030   pH 5.0 5.0 - 8.0   Glucose, UA NEGATIVE NEGATIVE mg/dL   Hgb urine dipstick NEGATIVE NEGATIVE   Bilirubin Urine NEGATIVE NEGATIVE    Ketones, ur NEGATIVE NEGATIVE mg/dL   Protein, ur 30 (A) NEGATIVE mg/dL   Nitrite NEGATIVE NEGATIVE   Leukocytes,Ua NEGATIVE NEGATIVE   RBC / HPF 0-5 0 - 5 RBC/hpf   WBC, UA 0-5 0 - 5 WBC/hpf   Bacteria, UA RARE (A) NONE SEEN   Squamous Epithelial / LPF 0-5 0 - 5   Mucus PRESENT    Hyaline Casts, UA PRESENT   Type and screen [facility]     Status: None   Collection Time: 07/27/22  9:36 PM  Result Value Ref Range   ABO/RH(D) B POS    Antibody Screen NEG    Sample Expiration      07/30/2022,2359 Performed at [facility], [address] [place], [place] [address]   Protime-INR     Status: None   Collection Time: 07/27/22  9:36 PM  Result Value Ref Range   Prothrombin Time 14.7 11.4 - 15.2 seconds   INR 1.2 0.8 - 1.2  APTT     Status: None   Collection Time: 07/27/22  9:36 PM  Result Value Ref Range   aPTT 31 24 - 36 seconds    Current Facility-Administered Medications:  0.9 %  sodium chloride infusion, , Intravenous, Continuous, Allena KatzPatel, Eliezer MccoyEkta V, MD   acetaminophen (TYLENOL) tablet 650 mg, 650 mg, Oral, Q6H PRN **OR** acetaminophen (TYLENOL) suppository 650 mg, 650 mg, Rectal, Q6H PRN, Allena KatzPatel, Izaia Say V, MD   heparin injection 5,000 Units, 5,000 Units, Subcutaneous, Q8H, Araina Butrick V, MD   pantoprazole (PROTONIX) injection 40 mg, 40 mg, Intravenous, Once, Irena CordsPatel, Gerardo Territo V, MD   sodium chloride flush (NS) 0.9 % injection 3 mL, 3 mL, Intravenous, Q12H, Gertha CalkinPatel, Fabrizzio Marcella V, MD  Current Outpatient Medications:    aspirin 81 MG tablet, Take 81 mg by mouth daily., Disp: , Rfl:    Cholecalciferol (VITAMIN D3) 1000 UNITS CAPS, Take 1,000 Units by mouth daily., Disp: , Rfl:    clopidogrel (PLAVIX) 75 MG tablet, Take 75 mg by mouth daily., Disp: , Rfl:    co-enzyme Q-10 30 MG capsule, Take 60 mg by mouth daily., Disp: , Rfl:    diltiazem (CARDIZEM) 120 MG tablet, Take 1 tablet by mouth daily. (Patient not taking: Reported on 07/28/2022), Disp: , Rfl:    metoprolol  succinate (TOPROL-XL) 50 MG 24 hr tablet, Take 50 mg by mouth daily.  (Patient not taking: Reported on 07/28/2022), Disp: , Rfl:    Multiple Vitamins-Minerals (MULTIVITAMIN WITH MINERALS) tablet, Take 1 tablet by mouth daily., Disp: , Rfl:    nitroGLYCERIN (NITROSTAT) 0.4 MG SL tablet, Place 0.4 mg under the tongue every 5 (five) minutes as needed. , Disp: , Rfl:    Olmesartan-amLODIPine-HCTZ 40-10-12.5 MG TABS, Take 1 tablet by mouth once daily. daily, Disp: , Rfl:    simvastatin (ZOCOR) 40 MG tablet, Take 40 mg by mouth at bedtime., Disp: , Rfl:    trolamine salicylate (ASPERCREME) 10 % cream, Apply 1 application topically as needed for muscle pain., Disp: , Rfl:   COVID-19 Labs No results for input(s): "DDIMER", "FERRITIN", "LDH", "CRP" in the last 72 hours. No results found for: "SARSCOV2NAA"  Radiological Exams on Admission: CT Head Wo Contrast  Result Date: 07/27/2022 CLINICAL DATA:  Syncope with altered mental status. EXAM: CT HEAD WITHOUT CONTRAST TECHNIQUE: Contiguous axial images were obtained from the base of the skull through the vertex without intravenous contrast. RADIATION DOSE REDUCTION: This exam was performed according to the departmental dose-optimization program which includes automated exposure control, adjustment of the mA and/or kV according to patient size and/or use of iterative reconstruction technique. COMPARISON:  July 14, 2022 FINDINGS: Brain: There is mild cerebral atrophy with widening of the extra-axial spaces and ventricular dilatation. There are areas of decreased attenuation within the white matter tracts of the supratentorial brain, consistent with microvascular disease changes. Vascular: There is marked severity bilateral cavernous carotid artery calcification. Skull: Normal. Negative for fracture or focal lesion. Sinuses/Orbits: No acute finding. Other: None. IMPRESSION: 1. No acute intracranial abnormality. 2. Generalized cerebral atrophy with chronic white  matter small vessel ischemic changes. Electronically Signed   By: Aram Candelahaddeus  Houston M.D.   On: 07/27/2022 23:19    EKG: Independently reviewed.     Assessment and Plan: * Syncope and collapse We will admit patient for observation status for syncope and collapse suspected secondary to GI bleed. Fall precautions, aspiration precautions. MRI of the brain noncontrast. Continuous monitoring with EEG.   Anemia    Latest Ref Rng & Units 07/27/2022    9:36 PM 08/06/2014    4:20 AM 07/25/2014   10:09 AM  CBC  WBC 4.0 - 10.5 K/uL 16.0  9.3  11.2   Hemoglobin 12.0 -  15.0 g/dL 38.7  9.9  56.4   Hematocrit 36.0 - 46.0 % 32.2  29.9  40.1   Platelets 150 - 400 K/uL 346  189  295    Suspect acute on chronic. Will defer to am team for GI consult . Type/screen/ IV PPI.  At risk for falls Fall precautions.    DVT prophylaxis:  SCDs  Code Status:  Full code  Family Communication:  Arville Go (Daughter)  [phone] (Home Phone)   Disposition Plan:  To be determined  Consults called:  None  Admission status: Observation    Gertha Calkin MD [facility]  6 PM- 2 AM. Please contact me via secure Chat 6 PM-2 AM. [phone] ( Pager ) To contact the [facility] Attending or Consulting provider 7A - 7P or covering provider during after hours 7P -7A, for this patient.   Check the care team in [facility] and look for a) attending/consulting [facility] provider listed and b) the [facility] team listed Log into [email or web address] and use [facility] universal password to access. If you do not have the password, please contact the hospital operator. Locate the [facility] provider you are looking for under [facility] and page to a number that you can be directly reached. If you still have difficulty reaching the provider, please page the [facility] (Director on Call) for the Hospitalists listed on amion for assistance. [email or web address] 07/28/2022, 2:36 AM

## 2022-07-28 NOTE — Evaluation (Signed)
Occupational Therapy Evaluation Patient Details Name: Debbie Foster MRN: [id number] DOB: 08/20/1925 Today's Date: 07/28/2022   History of Present Illness Pt is a 88 year old female admitted following feeling lightheaded and passing out while on the toilet. PMH significant for hypertension, migraines, stroke   Clinical Impression   Chart reviewed, RN cleared pt for participation in OT evaluation. Pt is alert and oriented x4, good awareness of deficits. Daughter in law and daughter present throughout. Daughter in law lives next door and reports she can help as needed. Pt has had two falls in the last two weeks, reports feeling weaker than typical baseline. Pt presents with deficits in strength, endurance, activity tolerance affecting safe and optimal ADL completion. Recommend discharge home with HHOT to address functional deficits. Pt is left as received, all needs met. OT will follow acutely.      Recommendations for follow up therapy are one component of a multi-disciplinary discharge planning process, led by the attending physician.  Recommendations may be updated based on patient status, additional functional criteria and insurance authorization.   Follow Up Recommendations  Home health OT    Assistance Recommended at Discharge Frequent or constant Supervision/Assistance  Patient can return home with the following A little help with walking and/or transfers;A little help with bathing/dressing/bathroom    Functional Status Assessment  Patient has had a recent decline in their functional status and demonstrates the ability to make significant improvements in function in a reasonable and predictable amount of time.  Equipment Recommendations  None recommended by OT;Other (comment) (pt has recommended equipment)    Recommendations for Other Services       Precautions / Restrictions Precautions Precautions: Fall Restrictions Weight Bearing Restrictions: No      Mobility Bed  Mobility Overal bed mobility: Modified Independent                  Transfers Overall transfer level: Needs assistance Equipment used: Rolling walker (2 wheels) Transfers: Sit to/from Stand Sit to Stand: Min guard                  Balance Overall balance assessment: Needs assistance Sitting-balance support: Bilateral upper extremity supported Sitting balance-Leahy Scale: Good     Standing balance support: Bilateral upper extremity supported Standing balance-Leahy Scale: Fair                             ADL either performed or assessed with clinical judgement   ADL Overall ADL's : Needs assistance/impaired                                       General ADL Comments: CGA for short amb transfer to bedside commode, SET UP for grooming tasks in seated     Vision Baseline Vision/History: 6 Macular Degeneration Patient Visual Report: No change from baseline (Pt has R sided peripheral vision impairment which is current baseline, wears glasses)       Perception     Praxis      Pertinent Vitals/Pain Pain Assessment Pain Assessment: No/denies pain     Hand Dominance     Extremity/Trunk Assessment Upper Extremity Assessment Upper Extremity Assessment: Generalized weakness   Lower Extremity Assessment Lower Extremity Assessment: Generalized weakness       Communication Communication Communication: HOH   Cognition Arousal/Alertness: Awake/alert Behavior During Therapy: WFL for tasks  assessed/performed Overall Cognitive Status: Within Functional Limits for tasks assessed                                       General Comments  vss throughout    Exercises     Shoulder Instructions      Home Living Family/patient expects to be discharged to:: Private residence Living Arrangements: Alone Available Help at Discharge: Family (DIL lives next door, available to assist as needed) Type of Home: House Home  Access: Stairs to enter Technical brewer of Steps: 1+1   Home Layout: One level         Biochemist, clinical: [place]: Conservation officer, nature (2 wheels);Rollator (4 wheels);Shower seat;BSC/3in1;Cane - single point;Other (comment) (lift chair)          Prior Functioning/Environment Prior Level of Function : Independent/Modified Independent             Mobility Comments: amb with rollator, 2 falls in the last 2 weeks ADLs Comments: MOD I with dressing, grooming, feeding; supervision for bathing tasks; can perform light household ADLs, family assits with IADLs as needed, does not drive        OT Problem List: Decreased strength;Decreased activity tolerance;Impaired balance (sitting and/or standing);Decreased safety awareness      OT Treatment/Interventions: Self-care/ADL training;Balance training;Therapeutic exercise;Patient/family education;Therapeutic activities;DME and/or AE instruction    OT Goals(Current goals can be found in the care plan section) Acute Rehab OT Goals Patient Stated Goal: go home OT Goal Formulation: With patient/family Time For Goal Achievement: 08/11/22 Potential to Achieve Goals: Good ADL Goals Pt Will Perform Grooming: sitting;with set-up Pt Will Perform Upper Body Bathing: sitting;with set-up;with supervision Pt Will Perform Lower Body Dressing: with supervision;sitting/lateral leans Pt Will Transfer to Toilet: with supervision Pt Will Perform Toileting - Clothing Manipulation and hygiene: with supervision  OT Frequency: Min 2X/week    Co-evaluation              AM-PAC OT "6 Clicks" Daily Activity     Outcome Measure Help from another person eating meals?: None Help from another person taking care of personal grooming?: A Little Help from another person toileting, which includes using toliet, bedpan, or urinal?: A Little Help from another person bathing (including washing, rinsing, drying)?: A Little Help from another  person to put on and taking off regular upper body clothing?: None Help from another person to put on and taking off regular lower body clothing?: A Little 6 Click Score: 20   End of Session Equipment Utilized During Treatment: Rolling walker (2 wheels) Nurse Communication: Mobility status  Activity Tolerance: Patient tolerated treatment well Patient left: in bed;with call bell/phone within reach;with family/visitor present  OT Visit Diagnosis: Unsteadiness on feet (R26.81);History of falling (Z91.81);Muscle weakness (generalized) (M62.81)                Time: 1449-1510 OT Time Calculation (min): 21 min Charges:  OT General Charges $OT Visit: 1 Visit OT Evaluation $OT Eval Low Complexity: 1 Low Shanon Payor, OTD OTR/L  07/28/22, 4:17 PM

## 2022-07-28 NOTE — Care Management Obs Status (Signed)
MEDICARE OBSERVATION STATUS NOTIFICATION   Patient Details  Name: Debbie Foster MRN: [id number] Date of Birth: 07-Apr-1925   Medicare Observation Status Notification Given:  Yes    Chapman Fitch, RN 07/28/2022, 11:28 AM

## 2022-07-28 NOTE — TOC Initial Note (Signed)
Transition of Care [facility]) - Initial/Assessment Note    Patient Details  Name: Debbie Foster MRN: [id number] Date of Birth: 1925-07-31  [facility] CM/SW Contact:    Chapman Fitch, RN Phone Number: 07/28/2022, 11:25 AM  Clinical Narrative:                    Admitted for: syncope Admitted from: home alone [id number] Current home health/prior home health/DME: Rw, rollator, lift chair, BSC   Therapy recommending home health. Per daughter at bedside patient is open with [facility] home health for RN.  Will at PT at discharge.  Debbie Foster with [facility] notified       Patient Goals and CMS Choice        Expected Discharge Plan and Services                                                Prior Living Arrangements/Services                       Activities of Daily Living Home Assistive Devices/Equipment: None ADL Screening (condition at time of admission) Patient's cognitive ability adequate to safely complete daily activities?: Yes Is the patient deaf or have difficulty hearing?: Yes Does the patient have difficulty seeing, even when wearing glasses/contacts?: Yes Does the patient have difficulty concentrating, remembering, or making decisions?: Yes Patient able to express need for assistance with ADLs?: Yes Does the patient have difficulty dressing or bathing?: No Independently performs ADLs?: Yes (appropriate for developmental age) Does the patient have difficulty walking or climbing stairs?: Yes Weakness of Legs: Both Weakness of Arms/Hands: Both  Permission Sought/Granted                  Emotional Assessment              Admission diagnosis:  Syncope and collapse [R55] Gastrointestinal hemorrhage, unspecified gastrointestinal hemorrhage type [K92.2] Patient Active Problem List   Diagnosis Date Noted   Syncope and collapse 07/28/2022   At risk for falls 07/28/2022   Anemia 07/28/2022   Gastrointestinal hemorrhage  with melena 07/28/2022   Rotator cuff arthropathy 08/05/2014   PCP:  Alan Mulder, MD Pharmacy:   [facility] - [address], [place] - [address] [place] [address] Phone: [phone] Fax: [phone]     Social Determinants of Health (SDOH) Interventions    Readmission Risk Interventions     No data to display

## 2022-07-28 NOTE — Evaluation (Signed)
Physical Therapy Evaluation Patient Details Name: Debbie Foster MRN: [id number] DOB: January 24, 1925 Today's Date: 07/28/2022  History of Present Illness  88 y/o female here after fall/syncopal epsidose in her bathroom.  Clinical Impression  Pt did well with PT exam and though she is not quite at her baseline ultimately did well and showed ability do mobility and in home ambulation distances w/o direct assist.  Daughter lives next door and is with her multiple hours/day, states she can increase how much she is there.  Pt showed overall appropriate safety and mobility for return to home with family assist and will benefit from HHPT to get back to her baseline functional level.       Recommendations for follow up therapy are one component of a multi-disciplinary discharge planning process, led by the attending physician.  Recommendations may be updated based on patient status, additional functional criteria and insurance authorization.  Follow Up Recommendations Home health PT      Assistance Recommended at Discharge Frequent or constant Supervision/Assistance  Patient can return home with the following  Help with stairs or ramp for entrance;Assistance with cooking/housework;Assist for transportation;A little help with walking and/or transfers;A little help with bathing/dressing/bathroom    Equipment Recommendations None recommended by PT  Recommendations for Other Services       Functional Status Assessment Patient has had a recent decline in their functional status and demonstrates the ability to make significant improvements in function in a reasonable and predictable amount of time.     Precautions / Restrictions Precautions Precautions: Fall Restrictions Weight Bearing Restrictions: No      Mobility  Bed Mobility Overal bed mobility: Modified Independent             General bed mobility comments: Pt did well getting to EOB w/o direct assist    Transfers Overall transfer  level: Needs assistance Equipment used: Rolling walker (2 wheels) Transfers: Sit to/from Stand Sit to Stand: Min guard           General transfer comment: Pt needed some extra time and cuing, but was able to rise w/o phyiscal assist.  She did use L LE leveraging back on bed to assist with getting to standing    Ambulation/Gait Ambulation/Gait assistance: Min guard Gait Distance (Feet): 50 Feet Assistive device: Rolling walker (2 wheels)         General Gait Details: Pt with slow but steady gait, reliant on the walker.  Baseline L limp that is more pronounced but did not lead to any overt safety issues. Pt reports fatigue, was on room air t/o the effort with sats staying in the 90s, HR up to ~120.  Stairs            Wheelchair Mobility    Modified Rankin (Stroke Patients Only)       Balance Overall balance assessment: Needs assistance Sitting-balance support: Bilateral upper extremity supported Sitting balance-Leahy Scale: Good     Standing balance support: Bilateral upper extremity supported Standing balance-Leahy Scale: Fair Standing balance comment: no overt LOBs with walker use, but clearly reliant on AD                             Pertinent Vitals/Pain Pain Assessment Pain Assessment: Faces (chronic OA knee pain) Faces Pain Scale: Hurts a little bit    Home Living Family/patient expects to be discharged to:: Private residence Living Arrangements: Alone Available Help at Discharge: Family (daughter lives next  door and visits/helps multiple hours QD)   Home Access: Stairs to enter Entrance Stairs-Rails:  (had grab bars) Entrance Stairs-Number of Steps: 1+1     Home Equipment: Agricultural consultant (2 wheels);Rollator (4 wheels);Shower seat;BSC/3in1;Cane - single point (lift chair)      Prior Function Prior Level of Function : Independent/Modified Independent             Mobility Comments: Pt does not get out of the home much but is able  to be up ad lib and do light chores, etc w/o assist most of the time.  Daughter will assist as needed.       Hand Dominance        Extremity/Trunk Assessment   Upper Extremity Assessment Upper Extremity Assessment: Generalized weakness (age appropriate defecits)    Lower Extremity Assessment Lower Extremity Assessment: Generalized weakness (age appropriate defecitsL knee limited ROM)       Communication   Communication: HOH  Cognition Arousal/Alertness: Awake/alert Behavior During Therapy: WFL for tasks assessed/performed Overall Cognitive Status: Within Functional Limits for tasks assessed                                          General Comments      Exercises     Assessment/Plan    PT Assessment Patient needs continued PT services  PT Problem List Decreased strength;Decreased range of motion;Decreased activity tolerance;Decreased balance;Decreased mobility;Decreased knowledge of use of DME;Cardiopulmonary status limiting activity       PT Treatment Interventions DME instruction;Gait training;Stair training;Functional mobility training;Therapeutic activities;Therapeutic exercise;Balance training;Patient/family education    PT Goals (Current goals can be found in the Care Plan section)  Acute Rehab PT Goals Patient Stated Goal: go home PT Goal Formulation: With patient Time For Goal Achievement: 08/10/22 Potential to Achieve Goals: Good    Frequency Min 2X/week     Co-evaluation               AM-PAC PT "6 Clicks" Mobility  Outcome Measure Help needed turning from your back to your side while in a flat bed without using bedrails?: None Help needed moving from lying on your back to sitting on the side of a flat bed without using bedrails?: None Help needed moving to and from a bed to a chair (including a wheelchair)?: A Little Help needed standing up from a chair using your arms (e.g., wheelchair or bedside chair)?: A Little Help  needed to walk in hospital room?: A Little Help needed climbing 3-5 steps with a railing? : A Little 6 Click Score: 20    End of Session Equipment Utilized During Treatment: Gait belt Activity Tolerance: Patient limited by fatigue;Patient tolerated treatment well Patient left: with chair alarm set;with call bell/phone within reach;with family/visitor present Nurse Communication: Mobility status PT Visit Diagnosis: Muscle weakness (generalized) (M62.81);Difficulty in walking, not elsewhere classified (R26.2);Unsteadiness on feet (R26.81)    Time: 4166-0630 PT Time Calculation (min) (ACUTE ONLY): 22 min   Charges:   PT Evaluation $PT Eval Low Complexity: 1 Low PT Treatments $Gait Training: 8-22 mins        Malachi Pro, DPT 07/28/2022, 12:32 PM

## 2022-07-28 NOTE — Plan of Care (Signed)
  Problem: Clinical Measurements: Goal: Ability to maintain clinical measurements within normal limits will improve Outcome: Progressing   Problem: Clinical Measurements: Goal: Will remain free from infection Outcome: Progressing   Problem: Clinical Measurements: Goal: Diagnostic test results will improve Outcome: Progressing   

## 2022-07-29 DIAGNOSIS — R079 Chest pain, unspecified: Secondary | ICD-10-CM

## 2022-07-29 DIAGNOSIS — R55 Syncope and collapse: Secondary | ICD-10-CM | POA: Diagnosis not present

## 2022-07-29 LAB — BASIC METABOLIC PANEL
Anion gap: 3 — ABNORMAL LOW (ref 5–15)
BUN: 34 mg/dL — ABNORMAL HIGH (ref 8–23)
CO2: 25 mmol/L (ref 22–32)
Calcium: 8.9 mg/dL (ref 8.9–10.3)
Chloride: 109 mmol/L (ref 98–111)
Creatinine, Ser: 0.96 mg/dL (ref 0.44–1.00)
GFR, Estimated: 54 mL/min — ABNORMAL LOW (ref >60)
Glucose, Bld: 105 mg/dL — ABNORMAL HIGH (ref 70–99)
Potassium: 4.5 mmol/L (ref 3.5–5.1)
Sodium: 137 mmol/L (ref 135–145)

## 2022-07-29 LAB — CBC
HCT: 23.3 % — ABNORMAL LOW (ref 36.0–46.0)
Hemoglobin: 7.9 g/dL — ABNORMAL LOW (ref 12.0–15.0)
MCH: 31.1 pg (ref 26.0–34.0)
MCHC: 33.9 g/dL (ref 30.0–36.0)
MCV: 91.7 fL (ref 80.0–100.0)
Platelets: 208 10*3/uL (ref 150–400)
RBC: 2.54 MIL/uL — ABNORMAL LOW (ref 3.87–5.11)
RDW: 13.2 % (ref 11.5–15.5)
WBC: 10.9 10*3/uL — ABNORMAL HIGH (ref 4.0–10.5)
nRBC: 0 % (ref 0.0–0.2)

## 2022-07-29 LAB — PREPARE RBC (CROSSMATCH)

## 2022-07-29 LAB — URINALYSIS, COMPLETE (UACMP) WITH MICROSCOPIC
Bacteria, UA: NONE SEEN
Bilirubin Urine: NEGATIVE
Glucose, UA: NEGATIVE mg/dL
Ketones, ur: NEGATIVE mg/dL
Nitrite: NEGATIVE
Protein, ur: NEGATIVE mg/dL
Specific Gravity, Urine: 1.014 (ref 1.005–1.030)
pH: 5 (ref 5.0–8.0)

## 2022-07-29 LAB — TROPONIN I (HIGH SENSITIVITY)
Troponin I (High Sensitivity): 34 ng/L — ABNORMAL HIGH (ref <18)
Troponin I (High Sensitivity): 59 ng/L — ABNORMAL HIGH (ref <18)
Troponin I (High Sensitivity): 58 ng/L — ABNORMAL HIGH (ref <18)

## 2022-07-29 LAB — HEMOGLOBIN AND HEMATOCRIT, BLOOD
HCT: 30.6 % — ABNORMAL LOW (ref 36.0–46.0)
Hemoglobin: 10.3 g/dL — ABNORMAL LOW (ref 12.0–15.0)

## 2022-07-29 LAB — MAGNESIUM: Magnesium: 1.9 mg/dL (ref 1.7–2.4)

## 2022-07-29 LAB — PHOSPHORUS: Phosphorus: 3 mg/dL (ref 2.5–4.6)

## 2022-07-29 MED ORDER — ADULT MULTIVITAMIN W/MINERALS CH
1.0000 | ORAL_TABLET | Freq: Every day | ORAL | Status: DC
  Administered 2022-07-30 – 2022-08-01 (×3): 1 via ORAL
  Filled 2022-07-29 (×3): qty 1

## 2022-07-29 MED ORDER — ENSURE ENLIVE PO LIQD
237.0000 mL | Freq: Two times a day (BID) | ORAL | Status: DC
  Administered 2022-07-29 – 2022-08-01 (×4): 237 mL via ORAL

## 2022-07-29 MED ORDER — PANTOPRAZOLE SODIUM 40 MG PO TBEC
40.0000 mg | DELAYED_RELEASE_TABLET | Freq: Two times a day (BID) | ORAL | Status: DC
  Administered 2022-07-29 – 2022-08-01 (×6): 40 mg via ORAL
  Filled 2022-07-29 (×6): qty 1

## 2022-07-29 MED ORDER — SODIUM CHLORIDE 0.9% IV SOLUTION: Freq: Once | INTRAVENOUS | Status: DC

## 2022-07-29 NOTE — Progress Notes (Signed)
PT Cancellation Note  Patient Details Name: Debbie Foster MRN: [id number] DOB: February 03, 1925   Cancelled Treatment:    Reason Eval/Treat Not Completed: Medical issues which prohibited therapy (Per treatment team, pt has started PRBC trasnfusion. Will attemt treatment again at later date/time.)  4:43 PM, 07/29/22 Rosamaria Lints, PT, DPT Physical Therapist - [facility]  [phone] (ASCOM)    [place] C 07/29/2022, 4:43 PM

## 2022-07-29 NOTE — Progress Notes (Signed)
Order received from Dr Timothy Lasso for a soft diet

## 2022-07-29 NOTE — Progress Notes (Addendum)
Blood reached patient at 00:33.

## 2022-07-29 NOTE — Progress Notes (Signed)
Pt. C/o left-sided chest pain. EKG obtained. Manuela Schwartz, NP made aware.

## 2022-07-29 NOTE — Progress Notes (Signed)
PROGRESS NOTE    Debbie Foster  ZOX:[id number] DOB: Oct 21, 1925 DOA: 07/27/2022 PCP: Alan Mulder, MD   Brief Narrative:  88 year old with past medical history of GI bleed, HTN, HLD, SCC of skin, GERD, CVA comes to the hospital after having an episode of syncope in the bathroom.  She also reports of dark stools and has been on Plavix.  She pressed her life alert button during this event and EMS brought her to the hospital.  In the ED her hemoglobin was 10, stool guaiac was positive, CT head was negative.  Upon admission she was started on PPI, Plavix and aspirin were discontinued, seen by GI.  Due to drop in hemoglobin she required 1 unit PRBC transfusion.   Assessment & Plan:  Principal Problem:   Syncope and collapse Active Problems:   Anemia   At risk for falls   Gastrointestinal hemorrhage with melena     Assessment and Plan: * Syncope and collapse Suspect from underlying bleeding versus vasovagal episode.  CTH/C-spine = negative. XR shows some left ankle swelling. Hemoglobin stable we will continue to monitor.  Baseline 10.0.  Discussed with Dr. Timothy Lasso from GI, will plan for conservative management with PPI for 8 weeks.  Monitor hemoglobin.  Will discontinue Plavix going forward as there is no clear indication for this.  Continue aspirin upon discharge. - Echocardiogram-EF 65%, grade 1 DD - TSH = Normal.  -PT/OT = HH   Anemia of Chronic Disease  Acute Blood Loss Anemia -Anemia of chronic disease. Baseline Hb 10. HB today 10.2 > 8.2 > 6.8. s/p 1U PRBC transfusion. Hb now 7.9. Will give her one more U PRBC  Chest discomfort, resolved Elevated Trops -Demand ischemia.  Trops trending down. EKG= no acute ST T changes. Echo EF 65%, grade 1 DD.  We will hold off on pursuing any aggressive work-up given her age and comorbidities.  Essential HTN -Currently home p.o. medications on hold.  We will place her on IV as needed  HLD -Statin  Hx of CVA -On home aspirin Plavix which  is currently on hold  Hx of SCC of her Skin -Seen by dermatology back in 2016.  Appears to be stable   At risk for falls Fall precautions.    DVT prophylaxis: SCDs Code Status: DNR confirmed by family.  Family Communication: Daughter at bedside  Maintain hospital stay to closely monitor hemoglobin level; overnight had further bleeding needing PRBC tx.   Subjective: Feels better, had some chest pain and more episodes of rectal bleeding yesterday. None this morning yet.   Examination: Constitutional: Not in acute distress. Elderly frail. Hard of hearing.  Respiratory: Clear to auscultation bilaterally Cardiovascular: Normal sinus rhythm, no rubs Abdomen: Nontender nondistended good bowel sounds Musculoskeletal: No edema noted Skin: No rashes seen Neurologic: CN 2-12 grossly intact.  And nonfocal Psychiatric: Normal judgment and insight. Alert and oriented x 3. Normal mood.     Objective: Vitals:   07/29/22 0028 07/29/22 0048 07/29/22 0335 07/29/22 0430  BP: (!) 145/63 (!) 149/66 (!) 146/64   Pulse: 86     Resp: 19 19 18 20   Temp: (!) 97.3 F (36.3 C) (!) 97.3 F (36.3 C) (!) 97.4 F (36.3 C)   TempSrc: Axillary Axillary Axillary   SpO2: 100% 100% 100%   Weight:      Height:        Intake/Output Summary (Last 24 hours) at 07/29/2022 09/28/2022 Last data filed at 07/29/2022 0335 Gross per 24 hour  Intake  700 ml  Output 2 ml  Net 698 ml   Filed Weights   07/27/22 1925 07/28/22 0500  Weight: 39.9 kg 41.1 kg     Data Reviewed:   CBC: Recent Labs  Lab 07/27/22 2136 07/28/22 0514 07/28/22 2046 07/29/22 0514  WBC 16.0* 12.5*  --  10.9*  HGB 10.2* 8.2* 6.8* 7.9*  HCT 32.2* 25.1* 20.9* 23.3*  MCV 94.7 94.0  --  91.7  PLT 346 275  --  208   Basic Metabolic Panel: Recent Labs  Lab 07/27/22 2136 07/28/22 0514 07/29/22 0302 07/29/22 0514  NA 137 138 137  --   K 4.2 4.2 4.5  --   CL 104 105 109  --   CO2 24 28 25   --   GLUCOSE 132* 99 105*  --   BUN 29*  30* 34*  --   CREATININE 0.94 0.87 0.96  --   CALCIUM 9.6 9.3 8.9  --   MG  --   --  1.9  --   PHOS  --   --   --  3.0   GFR: Estimated Creatinine Clearance: 22.7 mL/min (by C-G formula based on SCr of 0.96 mg/dL). Liver Function Tests: Recent Labs  Lab 07/28/22 0514  AST 16  ALT 10  ALKPHOS 48  BILITOT 0.4  PROT 6.4*  ALBUMIN 2.8*   No results for input(s): "LIPASE", "AMYLASE" in the last 168 hours. No results for input(s): "AMMONIA" in the last 168 hours. Coagulation Profile: Recent Labs  Lab 07/27/22 2136  INR 1.2   Cardiac Enzymes: No results for input(s): "CKTOTAL", "CKMB", "CKMBINDEX", "TROPONINI" in the last 168 hours. BNP (last 3 results) No results for input(s): "PROBNP" in the last 8760 hours. HbA1C: No results for input(s): "HGBA1C" in the last 72 hours. CBG: No results for input(s): "GLUCAP" in the last 168 hours. Lipid Profile: No results for input(s): "CHOL", "HDL", "LDLCALC", "TRIG", "CHOLHDL", "LDLDIRECT" in the last 72 hours. Thyroid Function Tests: Recent Labs    07/28/22 0614  TSH 1.851   Anemia Panel: No results for input(s): "VITAMINB12", "FOLATE", "FERRITIN", "TIBC", "IRON", "RETICCTPCT" in the last 72 hours. Sepsis Labs: No results for input(s): "PROCALCITON", "LATICACIDVEN" in the last 168 hours.  No results found for this or any previous visit (from the past 240 hour(s)).       Radiology Studies: ECHOCARDIOGRAM COMPLETE  Result Date: 07/28/2022    ECHOCARDIOGRAM REPORT   Patient Name:   Debbie Foster [facility] Date of Exam: 07/28/2022 Medical Rec #:  07/30/2022     Height:       61.0 in Accession #:    [id number]    Weight:       90.6 lb Date of Birth:  10/18/1925     BSA:          1.349 m Patient Age:    89 years      BP:           15/79 mmHg Patient Gender: F             HR:           86 bpm. Exam Location:  [facility] Procedure: 2D Echo, Cardiac Doppler and Color Doppler Indications:     R55 Syncope  History:         Patient has no prior history of  Echocardiogram examinations.                  Stroke; Risk Factors:Hypertension and GERD.  Sonographer:     Eulah Pont RDCS Referring Phys:  [id number] [facility] Raynaldo Falco Diagnosing Phys: Julien Nordmann MD IMPRESSIONS  1. Left ventricular ejection fraction, by estimation, is 60 to 65%. The left ventricle has normal function. The left ventricle has no regional wall motion abnormalities. Left ventricular diastolic parameters are consistent with Grade I diastolic dysfunction (impaired relaxation).  2. Right ventricular systolic function is normal. The right ventricular size is normal. There is normal pulmonary artery systolic pressure. The estimated right ventricular systolic pressure is 28.6 mmHg.  3. The mitral valve is normal in structure. Mild mitral valve regurgitation. No evidence of mitral stenosis.  4. Tricuspid valve regurgitation is mild to moderate.  5. The aortic valve is normal in structure. There is moderate calcification of the aortic valve. Aortic valve regurgitation is mild. Aortic valve sclerosis/calcification is present, without any evidence of aortic stenosis.  6. The inferior vena cava is normal in size with greater than 50% respiratory variability, suggesting right atrial pressure of 3 mmHg. FINDINGS  Left Ventricle: Left ventricular ejection fraction, by estimation, is 60 to 65%. The left ventricle has normal function. The left ventricle has no regional wall motion abnormalities. The left ventricular internal cavity size was normal in size. There is  no left ventricular hypertrophy. Left ventricular diastolic parameters are consistent with Grade I diastolic dysfunction (impaired relaxation). Right Ventricle: The right ventricular size is normal. No increase in right ventricular wall thickness. Right ventricular systolic function is normal. There is normal pulmonary artery systolic pressure. The tricuspid regurgitant velocity is 2.53 m/s, and  with an assumed right atrial pressure of 3 mmHg, the  estimated right ventricular systolic pressure is 28.6 mmHg. Left Atrium: Left atrial size was normal in size. Right Atrium: Right atrial size was normal in size. Pericardium: There is no evidence of pericardial effusion. Mitral Valve: The mitral valve is normal in structure. Mild mitral valve regurgitation. No evidence of mitral valve stenosis. Tricuspid Valve: The tricuspid valve is normal in structure. Tricuspid valve regurgitation is mild to moderate. No evidence of tricuspid stenosis. Aortic Valve: The aortic valve is normal in structure. There is moderate calcification of the aortic valve. Aortic valve regurgitation is mild. Aortic regurgitation PHT measures 285 msec. Aortic valve sclerosis/calcification is present, without any evidence of aortic stenosis. Pulmonic Valve: The pulmonic valve was normal in structure. Pulmonic valve regurgitation is not visualized. No evidence of pulmonic stenosis. Aorta: The aortic root is normal in size and structure. Venous: The inferior vena cava is normal in size with greater than 50% respiratory variability, suggesting right atrial pressure of 3 mmHg. IAS/Shunts: No atrial level shunt detected by color flow Doppler.  LEFT VENTRICLE PLAX 2D LVIDd:         2.90 cm     Diastology LVIDs:         1.70 cm     LV e' medial:    4.68 cm/s LV PW:         1.20 cm     LV E/e' medial:  12.9 LV IVS:        1.10 cm     LV e' lateral:   5.22 cm/s LVOT diam:     2.10 cm     LV E/e' lateral: 11.6 LV SV:         60 LV SV Index:   44 LVOT Area:     3.46 cm  LV Volumes (MOD) LV vol d, MOD A2C: 42.9 ml LV vol d, MOD A4C:  48.7 ml LV vol s, MOD A2C: 16.2 ml LV vol s, MOD A4C: 20.7 ml LV SV MOD A2C:     26.7 ml LV SV MOD A4C:     48.7 ml LV SV MOD BP:      27.7 ml RIGHT VENTRICLE RV S prime:     13.90 cm/s TAPSE (M-mode): 1.9 cm LEFT ATRIUM             Index        RIGHT ATRIUM           Index LA diam:        3.30 cm 2.45 cm/m   RA Area:     10.00 cm LA Vol (A2C):   26.7 ml 19.79 ml/m  RA  Volume:   20.20 ml  14.97 ml/m LA Vol (A4C):   30.0 ml 22.24 ml/m LA Biplane Vol: 28.6 ml 21.20 ml/m  AORTIC VALVE LVOT Vmax:   114.00 cm/s LVOT Vmean:  67.300 cm/s LVOT VTI:    0.172 m AI PHT:      285 msec  AORTA Ao Root diam: 2.90 cm Ao Asc diam:  3.30 cm MITRAL VALVE                TRICUSPID VALVE MV Area (PHT): 3.97 cm     TR Peak grad:   25.6 mmHg MV Decel Time: 191 msec     TR Vmax:        253.00 cm/s MV E velocity: 60.60 cm/s MV A velocity: 121.00 cm/s  SHUNTS MV E/A ratio:  0.50         Systemic VTI:  0.17 m                             Systemic Diam: 2.10 cm Julien Nordmann MD Electronically signed by Julien Nordmann MD Signature Date/Time: 07/28/2022/4:55:44 PM    Final    CT Head Wo Contrast  Result Date: 07/27/2022 CLINICAL DATA:  Syncope with altered mental status. EXAM: CT HEAD WITHOUT CONTRAST TECHNIQUE: Contiguous axial images were obtained from the base of the skull through the vertex without intravenous contrast. RADIATION DOSE REDUCTION: This exam was performed according to the departmental dose-optimization program which includes automated exposure control, adjustment of the mA and/or kV according to patient size and/or use of iterative reconstruction technique. COMPARISON:  July 14, 2022 FINDINGS: Brain: There is mild cerebral atrophy with widening of the extra-axial spaces and ventricular dilatation. There are areas of decreased attenuation within the white matter tracts of the supratentorial brain, consistent with microvascular disease changes. Vascular: There is marked severity bilateral cavernous carotid artery calcification. Skull: Normal. Negative for fracture or focal lesion. Sinuses/Orbits: No acute finding. Other: None. IMPRESSION: 1. No acute intracranial abnormality. 2. Generalized cerebral atrophy with chronic white matter small vessel ischemic changes. Electronically Signed   By: Aram Candela M.D.   On: 07/27/2022 23:19        Scheduled Meds:  pantoprazole  (PROTONIX) IV  40 mg Intravenous Q12H   sodium chloride flush  3 mL Intravenous Q12H   Continuous Infusions:     LOS: 1 day   Time spent= 35 mins    Rashanda Magloire Joline Maxcy, MD [facility]  If 7PM-7AM, please contact night-coverage  07/29/2022, 7:22 AM

## 2022-07-29 NOTE — Progress Notes (Signed)
Pt. Noted to have flattening to her left lip. Charge RN made aware. Manuela Schwartz, NP made aware.

## 2022-07-29 NOTE — Progress Notes (Signed)
[facility] Inpatient Progress Note  Subjective: Patient seen for  hemodynamically significant anemia with melena and clots in the stool. She has been on Plavix and ASA.  Last Plavix dose was Wed.  She has received 1 unit PRBC yest Hgb 6.9> 7.9.  She has a second unit PRBC ordered for today.  Passed flatus, and smears of fresh red blood noted by nursing staff. She remains asymptomatic without any abdominal pain, nausea or vomiting.  She denies any chest pain, shortness of breath today. Vital signs have been stable.  Objective: Vital signs in last 24 hours: Temp:  [97.3 F (36.3 C)-98 F (36.7 C)] 98 F (36.7 C) (09/01 0742) Pulse Rate:  [84-101] 84 (09/01 0742) Resp:  [17-20] 18 (09/01 0742) BP: (119-159)/(43-70) 159/69 (09/01 0742) SpO2:  [98 %-100 %] 100 % (09/01 0742) Blood pressure (!) 159/69, pulse 84, temperature 98 F (36.7 C), temperature source Oral, resp. rate 18, height 5\' 1"  (1.549 m), weight 41.1 kg, SpO2 100 %.    Intake/Output from previous day: 08/31 0701 - 09/01 0700 In: 700 [P.O.:400; Blood:300] Out: 2 [Urine:1; Stool:1]  Intake/Output this shift: Total I/O In: 120 [P.O.:120] Out: 200 [Urine:200]   Gen: NAD. Appears comfortable. Sitting up in bed eating tomato soup. Visiting with  2 dtrs at bedside.   HEENT: Normal EOMI  Chest: CTA, no wheezes.  CV: RR nl S1, S2. Positive murmur.   Abd: soft, nt, nd. BS+  Ext: no edema.   Neuro: Alert and oriented. Judgement appears normal. Nonfocal.   Lab Results: Results for orders placed or performed during the hospital encounter of 07/27/22 (from the past 24 hour(s))  Hemoglobin and hematocrit, blood     Status: Abnormal   Collection Time: 07/28/22  8:46 PM  Result Value Ref Range   Hemoglobin 6.8 (L) 12.0 - 15.0 g/dL   HCT 07/30/22 (L) 71.0 - 62.6 %  Prepare RBC (crossmatch)     Status: None   Collection Time: 07/28/22  9:16 PM  Result Value Ref Range   Order Confirmation      ORDER  PROCESSED BY BLOOD BANK Performed at [facility], [address] [place], [place]   Troponin I (High Sensitivity)     Status: None   Collection Time: 07/28/22 11:03 PM  Result Value Ref Range   Troponin I (High Sensitivity) 16 <18 ng/L  Brain natriuretic peptide     Status: Abnormal   Collection Time: 07/28/22 11:03 PM  Result Value Ref Range   B Natriuretic Peptide 126.4 (H) 0.0 - 100.0 pg/mL  Troponin I (High Sensitivity)     Status: Abnormal   Collection Time: 07/29/22 12:51 AM  Result Value Ref Range   Troponin I (High Sensitivity) 34 (H) <18 ng/L  Basic metabolic panel     Status: Abnormal   Collection Time: 07/29/22  3:02 AM  Result Value Ref Range   Sodium 137 135 - 145 mmol/L   Potassium 4.5 3.5 - 5.1 mmol/L   Chloride 109 98 - 111 mmol/L   CO2 25 22 - 32 mmol/L   Glucose, Bld 105 (H) 70 - 99 mg/dL   BUN 34 (H) 8 - 23 mg/dL   Creatinine, Ser 09/28/22 0.44 - 1.00 mg/dL   Calcium 8.9 8.9 - 0.35 mg/dL   GFR, Estimated 54 (L) >60 mL/min   Anion gap 3 (L) 5 - 15  Magnesium     Status: None   Collection Time: 07/29/22  3:02 AM  Result Value Ref Range   Magnesium 1.9 1.7 - 2.4 mg/dL  Troponin I (High Sensitivity)     Status: Abnormal   Collection Time: 07/29/22  3:02 AM  Result Value Ref Range   Troponin I (High Sensitivity) 59 (H) <18 ng/L  CBC     Status: Abnormal   Collection Time: 07/29/22  5:14 AM  Result Value Ref Range   WBC 10.9 (H) 4.0 - 10.5 K/uL   RBC 2.54 (L) 3.87 - 5.11 MIL/uL   Hemoglobin 7.9 (L) 12.0 - 15.0 g/dL   HCT 32.6 (L) 71.2 - 45.8 %   MCV 91.7 80.0 - 100.0 fL   MCH 31.1 26.0 - 34.0 pg   MCHC 33.9 30.0 - 36.0 g/dL   RDW 09.9 83.3 - 82.5 %   Platelets 208 150 - 400 K/uL   nRBC 0.0 0.0 - 0.2 %  Troponin I (High Sensitivity)     Status: Abnormal   Collection Time: 07/29/22  5:14 AM  Result Value Ref Range   Troponin I (High Sensitivity) 58 (H) <18 ng/L  Phosphorus     Status: None   Collection Time: 07/29/22  5:14 AM  Result  Value Ref Range   Phosphorus 3.0 2.5 - 4.6 mg/dL  Prepare RBC (crossmatch)     Status: None   Collection Time: 07/29/22  9:30 AM  Result Value Ref Range   Order Confirmation      ORDER PROCESSED BY BLOOD BANK Performed at [facility], [address] [place], [place] [address]   Urinalysis, Complete w Microscopic     Status: Abnormal   Collection Time: 07/29/22 11:38 AM  Result Value Ref Range   Color, Urine YELLOW (A) YELLOW   APPearance CLEAR (A) CLEAR   Specific Gravity, Urine 1.014 1.005 - 1.030   pH 5.0 5.0 - 8.0   Glucose, UA NEGATIVE NEGATIVE mg/dL   Hgb urine dipstick SMALL (A) NEGATIVE   Bilirubin Urine NEGATIVE NEGATIVE   Ketones, ur NEGATIVE NEGATIVE mg/dL   Protein, ur NEGATIVE NEGATIVE mg/dL   Nitrite NEGATIVE NEGATIVE   Leukocytes,Ua TRACE (A) NEGATIVE   RBC / HPF 0-5 0 - 5 RBC/hpf   WBC, UA 6-10 0 - 5 WBC/hpf   Bacteria, UA NONE SEEN NONE SEEN   Squamous Epithelial / LPF 0-5 0 - 5   Hyaline Casts, UA PRESENT      Recent Labs    07/27/22 2136 07/28/22 0514 07/28/22 2046 07/29/22 0514  WBC 16.0* 12.5*  --  10.9*  HGB 10.2* 8.2* 6.8* 7.9*  HCT 32.2* 25.1* 20.9* 23.3*  PLT 346 275  --  208   BMET Recent Labs    07/27/22 2136 07/28/22 0514 07/29/22 0302  NA 137 138 137  K 4.2 4.2 4.5  CL 104 105 109  CO2 24 28 25   GLUCOSE 132* 99 105*  BUN 29* 30* 34*  CREATININE 0.94 0.87 0.96  CALCIUM 9.6 9.3 8.9   LFT Recent Labs    07/28/22 0514  PROT 6.4*  ALBUMIN 2.8*  AST 16  ALT 10  ALKPHOS 48  BILITOT 0.4   PT/INR Recent Labs    07/27/22 2136  LABPROT 14.7  INR 1.2    Studies/Results: ECHOCARDIOGRAM COMPLETE  Result Date: 07/28/2022    ECHOCARDIOGRAM REPORT   Patient Name:   TAMEY WANEK [facility] Date of Exam: 07/28/2022 Medical Rec #:  07/30/2022     Height:       61.0 in Accession #:  [id number]    Weight:       90.6 lb Date of Birth:  12/05/1934     BSA:          1.349 m Patient Age:    88 years      BP:           15/79 mmHg  Patient Gender: F             HR:           86 bpm. Exam Location:  [facility] Procedure: 2D Echo, Cardiac Doppler and Color Doppler Indications:     R55 Syncope  History:         Patient has no prior history of Echocardiogram examinations.                  Stroke; Risk Factors:Hypertension and GERD.  Sonographer:     Eulah Pont RDCS Referring Phys:  [id number] [facility] AMIN Diagnosing Phys: Julien Nordmann MD IMPRESSIONS  1. Left ventricular ejection fraction, by estimation, is 60 to 65%. The left ventricle has normal function. The left ventricle has no regional wall motion abnormalities. Left ventricular diastolic parameters are consistent with Grade I diastolic dysfunction (impaired relaxation).  2. Right ventricular systolic function is normal. The right ventricular size is normal. There is normal pulmonary artery systolic pressure. The estimated right ventricular systolic pressure is 28.6 mmHg.  3. The mitral valve is normal in structure. Mild mitral valve regurgitation. No evidence of mitral stenosis.  4. Tricuspid valve regurgitation is mild to moderate.  5. The aortic valve is normal in structure. There is moderate calcification of the aortic valve. Aortic valve regurgitation is mild. Aortic valve sclerosis/calcification is present, without any evidence of aortic stenosis.  6. The inferior vena cava is normal in size with greater than 50% respiratory variability, suggesting right atrial pressure of 3 mmHg. FINDINGS  Left Ventricle: Left ventricular ejection fraction, by estimation, is 60 to 65%. The left ventricle has normal function. The left ventricle has no regional wall motion abnormalities. The left ventricular internal cavity size was normal in size. There is  no left ventricular hypertrophy. Left ventricular diastolic parameters are consistent with Grade I diastolic dysfunction (impaired relaxation). Right Ventricle: The right ventricular size is normal. No increase in right ventricular wall thickness.  Right ventricular systolic function is normal. There is normal pulmonary artery systolic pressure. The tricuspid regurgitant velocity is 2.53 m/s, and  with an assumed right atrial pressure of 3 mmHg, the estimated right ventricular systolic pressure is 28.6 mmHg. Left Atrium: Left atrial size was normal in size. Right Atrium: Right atrial size was normal in size. Pericardium: There is no evidence of pericardial effusion. Mitral Valve: The mitral valve is normal in structure. Mild mitral valve regurgitation. No evidence of mitral valve stenosis. Tricuspid Valve: The tricuspid valve is normal in structure. Tricuspid valve regurgitation is mild to moderate. No evidence of tricuspid stenosis. Aortic Valve: The aortic valve is normal in structure. There is moderate calcification of the aortic valve. Aortic valve regurgitation is mild. Aortic regurgitation PHT measures 285 msec. Aortic valve sclerosis/calcification is present, without any evidence of aortic stenosis. Pulmonic Valve: The pulmonic valve was normal in structure. Pulmonic valve regurgitation is not visualized. No evidence of pulmonic stenosis. Aorta: The aortic root is normal in size and structure. Venous: The inferior vena cava is normal in size with greater than 50% respiratory variability, suggesting right atrial pressure of 3 mmHg. IAS/Shunts: No atrial level shunt detected  by color flow Doppler.  LEFT VENTRICLE PLAX 2D LVIDd:         2.90 cm     Diastology LVIDs:         1.70 cm     LV e' medial:    4.68 cm/s LV PW:         1.20 cm     LV E/e' medial:  12.9 LV IVS:        1.10 cm     LV e' lateral:   5.22 cm/s LVOT diam:     2.10 cm     LV E/e' lateral: 11.6 LV SV:         60 LV SV Index:   44 LVOT Area:     3.46 cm  LV Volumes (MOD) LV vol d, MOD A2C: 42.9 ml LV vol d, MOD A4C: 48.7 ml LV vol s, MOD A2C: 16.2 ml LV vol s, MOD A4C: 20.7 ml LV SV MOD A2C:     26.7 ml LV SV MOD A4C:     48.7 ml LV SV MOD BP:      27.7 ml RIGHT VENTRICLE RV S prime:      13.90 cm/s TAPSE (M-mode): 1.9 cm LEFT ATRIUM             Index        RIGHT ATRIUM           Index LA diam:        3.30 cm 2.45 cm/m   RA Area:     10.00 cm LA Vol (A2C):   26.7 ml 19.79 ml/m  RA Volume:   20.20 ml  14.97 ml/m LA Vol (A4C):   30.0 ml 22.24 ml/m LA Biplane Vol: 28.6 ml 21.20 ml/m  AORTIC VALVE LVOT Vmax:   114.00 cm/s LVOT Vmean:  67.300 cm/s LVOT VTI:    0.172 m AI PHT:      285 msec  AORTA Ao Root diam: 2.90 cm Ao Asc diam:  3.30 cm MITRAL VALVE                TRICUSPID VALVE MV Area (PHT): 3.97 cm     TR Peak grad:   25.6 mmHg MV Decel Time: 191 msec     TR Vmax:        253.00 cm/s MV E velocity: 60.60 cm/s MV A velocity: 121.00 cm/s  SHUNTS MV E/A ratio:  0.50         Systemic VTI:  0.17 m                             Systemic Diam: 2.10 cm Julien Nordmann MD Electronically signed by Julien Nordmann MD Signature Date/Time: 07/28/2022/4:55:44 PM    Final    CT Head Wo Contrast  Result Date: 07/27/2022 CLINICAL DATA:  Syncope with altered mental status. EXAM: CT HEAD WITHOUT CONTRAST TECHNIQUE: Contiguous axial images were obtained from the base of the skull through the vertex without intravenous contrast. RADIATION DOSE REDUCTION: This exam was performed according to the departmental dose-optimization program which includes automated exposure control, adjustment of the mA and/or kV according to patient size and/or use of iterative reconstruction technique. COMPARISON:  July 14, 2022 FINDINGS: Brain: There is mild cerebral atrophy with widening of the extra-axial spaces and ventricular dilatation. There are areas of decreased attenuation within the white matter tracts of the supratentorial brain, consistent with microvascular disease changes. Vascular: There  is marked severity bilateral cavernous carotid artery calcification. Skull: Normal. Negative for fracture or focal lesion. Sinuses/Orbits: No acute finding. Other: None. IMPRESSION: 1. No acute intracranial abnormality. 2. Generalized  cerebral atrophy with chronic white matter small vessel ischemic changes. Electronically Signed   By: Aram Candelahaddeus  Houston M.D.   On: 07/27/2022 23:19    Scheduled Inpatient Medications:    sodium chloride   Intravenous Once   pantoprazole  40 mg Oral BID   sodium chloride flush  3 mL Intravenous Q12H    PRN Inpatient Medications:  acetaminophen **OR** acetaminophen, guaiFENesin, hydrALAZINE, ipratropium-albuterol, metoprolol tartrate, nitroGLYCERIN, ondansetron (ZOFRAN) IV, senna-docusate, traZODone   Assessment:  88 year old with past medical history of GI bleed, HTN, HLD, SCC of skin, GERD, CVA comes to the hospital after having an episode of syncope in the bathroom.  She also reports of dark stools and has been on Plavix.  In the ED her hemoglobin was 10, stool guaiac was positive, CT head was negative.  Due to drop in hemoglobin she required 1 unit PRBC transfusion. She has been seen by Cardiology for demand ischemia.  DDX:  is broad although bun cr ratio elevated and report of dark stool to consider UGI source.  LGIB during the could be hemorrhoidal, diverticular, avm, malignancy- some of which are self limited in nature. No abdominal pain or any tenderness- ischemia less likely. She smears of blood noted by nursing staff when wiping for flatus. No stool or melena.   Plan:  Continue to hold the Plavix as it would need to be held 4 to 5 days and a case luminal evaluation is required.  Continue with Protonix - Supportive care.   - Avoid unnecessary lab draws - Avoid NSAIDs - Monitor for GI bleed.  If she were to become hemodynamically unstable, recommendation for CTA to assess bleeding location and may benefit from IR intervention over sedation and endoscopy. -This patient was seen in collaboration with Dr. Timothy Lassousso and the case was fully discussed. .  12:07 PM

## 2022-07-30 DIAGNOSIS — R55 Syncope and collapse: Secondary | ICD-10-CM | POA: Diagnosis not present

## 2022-07-30 LAB — CBC
HCT: 27.2 % — ABNORMAL LOW (ref 36.0–46.0)
Hemoglobin: 9.2 g/dL — ABNORMAL LOW (ref 12.0–15.0)
MCH: 30.5 pg (ref 26.0–34.0)
MCHC: 33.8 g/dL (ref 30.0–36.0)
MCV: 90.1 fL (ref 80.0–100.0)
Platelets: 220 10*3/uL (ref 150–400)
RBC: 3.02 MIL/uL — ABNORMAL LOW (ref 3.87–5.11)
RDW: 14.5 % (ref 11.5–15.5)
WBC: 10.2 10*3/uL (ref 4.0–10.5)
nRBC: 0 % (ref 0.0–0.2)

## 2022-07-30 LAB — BPAM RBC
Blood Product Expiration Date: 202309232359
Blood Product Expiration Date: 202309252359
ISSUE DATE / TIME: 202309010016
ISSUE DATE / TIME: 202309011223
Unit Type and Rh: 7300
Unit Type and Rh: 7300

## 2022-07-30 LAB — BASIC METABOLIC PANEL
Anion gap: 4 — ABNORMAL LOW (ref 5–15)
BUN: 39 mg/dL — ABNORMAL HIGH (ref 8–23)
CO2: 26 mmol/L (ref 22–32)
Calcium: 8.8 mg/dL — ABNORMAL LOW (ref 8.9–10.3)
Chloride: 110 mmol/L (ref 98–111)
Creatinine, Ser: 0.99 mg/dL (ref 0.44–1.00)
GFR, Estimated: 53 mL/min — ABNORMAL LOW (ref >60)
Glucose, Bld: 90 mg/dL (ref 70–99)
Potassium: 3.9 mmol/L (ref 3.5–5.1)
Sodium: 140 mmol/L (ref 135–145)

## 2022-07-30 LAB — TYPE AND SCREEN
ABO/RH(D): B POS
Antibody Screen: NEGATIVE
Unit division: 0
Unit division: 0

## 2022-07-30 LAB — MAGNESIUM: Magnesium: 1.7 mg/dL (ref 1.7–2.4)

## 2022-07-30 NOTE — Hospital Course (Signed)
Attending Physician Attestation:  I have obtained an interval history from the patient, reviewed the chart and performed a physical examination. The Advanced Practitioner's note, clinical impression and recommendations have been formulated with my input and I agree with the assessment and plan as outlined above.  Thank you for allowing [place] to be involved in this patient's care.  Please call me if you have any questions.   George Ina, M.D. [facility] Gastroenterology  [phone] - Office [phone]- Cell phone

## 2022-07-30 NOTE — Progress Notes (Signed)
PROGRESS NOTE    Debbie Foster  XTG:[id number] DOB: [birth year] DOA: 07/27/2022 PCP: Alan Mulder, MD   Brief Narrative:  88 year old with past medical history of GI bleed, HTN, HLD, SCC of skin, GERD, CVA comes to the hospital after having an episode of syncope in the bathroom.  She also reports of dark stools and has been on Plavix.  She pressed her life alert button during this event and EMS brought her to the hospital.  In the ED her hemoglobin was 10, stool guaiac was positive, CT head was negative.  Upon admission she was started on PPI, Plavix and aspirin were discontinued, seen by GI.  Due to drop in hemoglobin she required 1 unit PRBC transfusion.   Assessment & Plan:  Principal Problem:   Syncope and collapse Active Problems:   Anemia   At risk for falls   Gastrointestinal hemorrhage with melena     Assessment and Plan: * Syncope and collapse Suspect from underlying bleeding versus vasovagal episode.  CTH/C-spine = negative. XR shows some left ankle swelling. Hemoglobin stable we will continue to monitor.  Baseline 10.0.  Discussed with GI, conservative management.  Will discontinue Plavix.  PPI twice daily for 8 weeks followed by daily.  Outpatient follow-up.  Continue aspirin upon discharge - Echocardiogram-EF 65%, grade 1 DD - TSH = Normal.  -PT/OT = HH   Anemia of Chronic Disease  Acute Blood Loss Anemia -Anemia of chronic disease. Baseline Hb 10. HB today 10.2 > 8.2 > 6.8.  Status post 2 units PRBC transfusion.  Hemoglobin drifted down slightly more this morning.  Chest discomfort, resolved Elevated Trops -Demand ischemia.  Trops trending down. EKG= no acute ST T changes. Echo EF 65%, grade 1 DD.  We will hold off on pursuing any aggressive work-up given her age and comorbidities.  Essential HTN -Currently home p.o. medications on hold.  We will place her on IV as needed  HLD -Statin  Hx of CVA -On home aspirin Plavix which is currently on hold  Hx of  SCC of her Skin -Seen by dermatology back in 2016.  Appears to be stable   At risk for falls Fall precautions.    DVT prophylaxis: SCDs Code Status: DNR confirmed by family.  Family Communication: Daughter at bedside  Main during hospital stay to monitor hemoglobin for at least another 24 hours to make sure it stabilizes  Subjective: No further bleeding overnight but hemoglobin slightly drifted down this morning.  Denies any chest pain, shortness of breath.  No acute events overnight.  Daughters are present at bedside  Examination: In constitutional: Not in acute distress, elderly frail Respiratory: Clear to auscultation bilaterally Cardiovascular: Normal sinus rhythm, no rubs Abdomen: Nontender nondistended good bowel sounds Musculoskeletal: No edema noted Skin: No rashes seen Neurologic: CN 2-12 grossly intact.  And nonfocal Psychiatric: Normal judgment and insight. Alert and oriented x 3. Normal mood.   Objective: Vitals:   07/30/22 0434 07/30/22 0454 07/30/22 0456 07/30/22 0731  BP:   (!) 163/62 (!) 148/68  Pulse:  77 85 87  Resp:   16 18  Temp:   98.2 F (36.8 C) 98 F (36.7 C)  TempSrc:   Oral   SpO2:  100% 100% 99%  Weight: 40.4 kg     Height:        Intake/Output Summary (Last 24 hours) at 07/30/2022 1033 Last data filed at 07/30/2022 0409 Gross per 24 hour  Intake 672 ml  Output 600 ml  Net 72 ml   Filed Weights   07/27/22 1925 07/28/22 0500 07/30/22 0434  Weight: 39.9 kg 41.1 kg 40.4 kg     Data Reviewed:   CBC: Recent Labs  Lab 07/27/22 2136 07/28/22 0514 07/28/22 2046 07/29/22 0514 07/29/22 1613 07/30/22 0400  WBC 16.0* 12.5*  --  10.9*  --  10.2  HGB 10.2* 8.2* 6.8* 7.9* 10.3* 9.2*  HCT 32.2* 25.1* 20.9* 23.3* 30.6* 27.2*  MCV 94.7 94.0  --  91.7  --  90.1  PLT 346 275  --  208  --  220   Basic Metabolic Panel: Recent Labs  Lab 07/27/22 2136 07/28/22 0514 07/29/22 0302 07/29/22 0514 07/30/22 0400  NA 137 138 137  --  140  K  4.2 4.2 4.5  --  3.9  CL 104 105 109  --  110  CO2 24 28 25   --  26  GLUCOSE 132* 99 105*  --  90  BUN 29* 30* 34*  --  39*  CREATININE 0.94 0.87 0.96  --  0.99  CALCIUM 9.6 9.3 8.9  --  8.8*  MG  --   --  1.9  --  1.7  PHOS  --   --   --  3.0  --    GFR: Estimated Creatinine Clearance: 21.7 mL/min (by C-G formula based on SCr of 0.99 mg/dL). Liver Function Tests: Recent Labs  Lab 07/28/22 0514  AST 16  ALT 10  ALKPHOS 48  BILITOT 0.4  PROT 6.4*  ALBUMIN 2.8*   No results for input(s): "LIPASE", "AMYLASE" in the last 168 hours. No results for input(s): "AMMONIA" in the last 168 hours. Coagulation Profile: Recent Labs  Lab 07/27/22 2136  INR 1.2   Cardiac Enzymes: No results for input(s): "CKTOTAL", "CKMB", "CKMBINDEX", "TROPONINI" in the last 168 hours. BNP (last 3 results) No results for input(s): "PROBNP" in the last 8760 hours. HbA1C: No results for input(s): "HGBA1C" in the last 72 hours. CBG: No results for input(s): "GLUCAP" in the last 168 hours. Lipid Profile: No results for input(s): "CHOL", "HDL", "LDLCALC", "TRIG", "CHOLHDL", "LDLDIRECT" in the last 72 hours. Thyroid Function Tests: Recent Labs    07/28/22 0614  TSH 1.851   Anemia Panel: No results for input(s): "VITAMINB12", "FOLATE", "FERRITIN", "TIBC", "IRON", "RETICCTPCT" in the last 72 hours. Sepsis Labs: No results for input(s): "PROCALCITON", "LATICACIDVEN" in the last 168 hours.  No results found for this or any previous visit (from the past 240 hour(s)).       Radiology Studies: ECHOCARDIOGRAM COMPLETE  Result Date: 07/28/2022    ECHOCARDIOGRAM REPORT   Patient Name:   Debbie Foster [facility] Date of Exam: 07/28/2022 Medical Rec #:  07/30/2022     Height:       61.0 in Accession #:    [id number]    Weight:       90.6 lb Date of Birth:  1925/02/18     BSA:          1.349 m Patient Age:    88 years      BP:           15/79 mmHg Patient Gender: F             HR:           86 bpm. Exam Location:  [facility]  Procedure: 2D Echo, Cardiac Doppler and Color Doppler Indications:     R55 Syncope  History:  Patient has no prior history of Echocardiogram examinations.                  Stroke; Risk Factors:Hypertension and GERD.  Sonographer:     Eulah Pont RDCS Referring Phys:  [id number] [facility] Darra Rosa Diagnosing Phys: Julien Nordmann MD IMPRESSIONS  1. Left ventricular ejection fraction, by estimation, is 60 to 65%. The left ventricle has normal function. The left ventricle has no regional wall motion abnormalities. Left ventricular diastolic parameters are consistent with Grade I diastolic dysfunction (impaired relaxation).  2. Right ventricular systolic function is normal. The right ventricular size is normal. There is normal pulmonary artery systolic pressure. The estimated right ventricular systolic pressure is 28.6 mmHg.  3. The mitral valve is normal in structure. Mild mitral valve regurgitation. No evidence of mitral stenosis.  4. Tricuspid valve regurgitation is mild to moderate.  5. The aortic valve is normal in structure. There is moderate calcification of the aortic valve. Aortic valve regurgitation is mild. Aortic valve sclerosis/calcification is present, without any evidence of aortic stenosis.  6. The inferior vena cava is normal in size with greater than 50% respiratory variability, suggesting right atrial pressure of 3 mmHg. FINDINGS  Left Ventricle: Left ventricular ejection fraction, by estimation, is 60 to 65%. The left ventricle has normal function. The left ventricle has no regional wall motion abnormalities. The left ventricular internal cavity size was normal in size. There is  no left ventricular hypertrophy. Left ventricular diastolic parameters are consistent with Grade I diastolic dysfunction (impaired relaxation). Right Ventricle: The right ventricular size is normal. No increase in right ventricular wall thickness. Right ventricular systolic function is normal. There is normal pulmonary  artery systolic pressure. The tricuspid regurgitant velocity is 2.53 m/s, and  with an assumed right atrial pressure of 3 mmHg, the estimated right ventricular systolic pressure is 28.6 mmHg. Left Atrium: Left atrial size was normal in size. Right Atrium: Right atrial size was normal in size. Pericardium: There is no evidence of pericardial effusion. Mitral Valve: The mitral valve is normal in structure. Mild mitral valve regurgitation. No evidence of mitral valve stenosis. Tricuspid Valve: The tricuspid valve is normal in structure. Tricuspid valve regurgitation is mild to moderate. No evidence of tricuspid stenosis. Aortic Valve: The aortic valve is normal in structure. There is moderate calcification of the aortic valve. Aortic valve regurgitation is mild. Aortic regurgitation PHT measures 285 msec. Aortic valve sclerosis/calcification is present, without any evidence of aortic stenosis. Pulmonic Valve: The pulmonic valve was normal in structure. Pulmonic valve regurgitation is not visualized. No evidence of pulmonic stenosis. Aorta: The aortic root is normal in size and structure. Venous: The inferior vena cava is normal in size with greater than 50% respiratory variability, suggesting right atrial pressure of 3 mmHg. IAS/Shunts: No atrial level shunt detected by color flow Doppler.  LEFT VENTRICLE PLAX 2D LVIDd:         2.90 cm     Diastology LVIDs:         1.70 cm     LV e' medial:    4.68 cm/s LV PW:         1.20 cm     LV E/e' medial:  12.9 LV IVS:        1.10 cm     LV e' lateral:   5.22 cm/s LVOT diam:     2.10 cm     LV E/e' lateral: 11.6 LV SV:  60 LV SV Index:   44 LVOT Area:     3.46 cm  LV Volumes (MOD) LV vol d, MOD A2C: 42.9 ml LV vol d, MOD A4C: 48.7 ml LV vol s, MOD A2C: 16.2 ml LV vol s, MOD A4C: 20.7 ml LV SV MOD A2C:     26.7 ml LV SV MOD A4C:     48.7 ml LV SV MOD BP:      27.7 ml RIGHT VENTRICLE RV S prime:     13.90 cm/s TAPSE (M-mode): 1.9 cm LEFT ATRIUM             Index         RIGHT ATRIUM           Index LA diam:        3.30 cm 2.45 cm/m   RA Area:     10.00 cm LA Vol (A2C):   26.7 ml 19.79 ml/m  RA Volume:   20.20 ml  14.97 ml/m LA Vol (A4C):   30.0 ml 22.24 ml/m LA Biplane Vol: 28.6 ml 21.20 ml/m  AORTIC VALVE LVOT Vmax:   114.00 cm/s LVOT Vmean:  67.300 cm/s LVOT VTI:    0.172 m AI PHT:      285 msec  AORTA Ao Root diam: 2.90 cm Ao Asc diam:  3.30 cm MITRAL VALVE                TRICUSPID VALVE MV Area (PHT): 3.97 cm     TR Peak grad:   25.6 mmHg MV Decel Time: 191 msec     TR Vmax:        253.00 cm/s MV E velocity: 60.60 cm/s MV A velocity: 121.00 cm/s  SHUNTS MV E/A ratio:  0.50         Systemic VTI:  0.17 m                             Systemic Diam: 2.10 cm Julien Nordmann MD Electronically signed by Julien Nordmann MD Signature Date/Time: 07/28/2022/4:55:44 PM    Final         Scheduled Meds:  sodium chloride   Intravenous Once   feeding supplement  237 mL Oral BID BM   multivitamin with minerals  1 tablet Oral Daily   pantoprazole  40 mg Oral BID   sodium chloride flush  3 mL Intravenous Q12H   Continuous Infusions:     LOS: 2 days   Time spent= 35 mins    Nemiah Kissner Joline Maxcy, MD [facility]  If 7PM-7AM, please contact night-coverage  07/30/2022, 10:33 AM

## 2022-07-30 NOTE — Plan of Care (Signed)
  Problem: Education: Goal: Knowledge of General Education information will improve Description Including pain rating scale, medication(s)/side effects and non-pharmacologic comfort measures Outcome: Progressing   Problem: Health Behavior/Discharge Planning: Goal: Ability to manage health-related needs will improve Outcome: Progressing   Problem: Clinical Measurements: Goal: Ability to maintain clinical measurements within normal limits will improve Outcome: Progressing Goal: Will remain free from infection Outcome: Progressing Goal: Diagnostic test results will improve Outcome: Progressing Goal: Respiratory complications will improve Outcome: Progressing Goal: Cardiovascular complication will be avoided Outcome: Progressing   Problem: Activity: Goal: Risk for activity intolerance will decrease Outcome: Progressing   Problem: Nutrition: Goal: Adequate nutrition will be maintained Outcome: Progressing   Problem: Coping: Goal: Level of anxiety will decrease Outcome: Progressing   Problem: Elimination: Goal: Will not experience complications related to bowel motility Outcome: Progressing Goal: Will not experience complications related to urinary retention Outcome: Progressing   Problem: Pain Managment: Goal: General experience of comfort will improve Outcome: Progressing   Problem: Safety: Goal: Ability to remain free from injury will improve Outcome: Progressing   Problem: Skin Integrity: Goal: Risk for impaired skin integrity will decrease Outcome: Progressing   Problem: Education: Goal: Knowledge of General Education information will improve Description Including pain rating scale, medication(s)/side effects and non-pharmacologic comfort measures Outcome: Progressing   Problem: Clinical Measurements: Goal: Ability to maintain clinical measurements within normal limits will improve Outcome: Progressing Goal: Will remain free from infection Outcome:  Progressing Goal: Diagnostic test results will improve Outcome: Progressing Goal: Respiratory complications will improve Outcome: Progressing Goal: Cardiovascular complication will be avoided Outcome: Progressing   Problem: Activity: Goal: Risk for activity intolerance will decrease Outcome: Progressing   Problem: Nutrition: Goal: Adequate nutrition will be maintained Outcome: Progressing   Problem: Coping: Goal: Level of anxiety will decrease Outcome: Progressing   Problem: Elimination: Goal: Will not experience complications related to bowel motility Outcome: Progressing Goal: Will not experience complications related to urinary retention Outcome: Progressing   Problem: Pain Managment: Goal: General experience of comfort will improve Outcome: Progressing   Problem: Safety: Goal: Ability to remain free from injury will improve Outcome: Progressing   Problem: Skin Integrity: Goal: Risk for impaired skin integrity will decrease Outcome: Progressing   

## 2022-07-30 NOTE — Progress Notes (Signed)
GI Inpatient Follow-up Note  Subjective:  Patient seen in follow-up for LGI bleed suspected 2/2 diverticular bleed. No acute events overnight. 2 daughters in room with patient. No new complaints. She had small volume BM this morning with no hematochezia or melanotic stool. She denies any abdominal pain, abdominal cramping, nausea, vomiting, chest pain, or shortness of breath. She is tolerating food this morning. Hemoglobin this morning 9.2.   Scheduled Inpatient Medications:   sodium chloride   Intravenous Once   feeding supplement  237 mL Oral BID BM   multivitamin with minerals  1 tablet Oral Daily   pantoprazole  40 mg Oral BID   sodium chloride flush  3 mL Intravenous Q12H   PRN Inpatient Medications:  acetaminophen **OR** acetaminophen, guaiFENesin, hydrALAZINE, ipratropium-albuterol, metoprolol tartrate, nitroGLYCERIN, ondansetron (ZOFRAN) IV, senna-docusate, traZODone  Review of Systems: Constitutional: Weight is stable.  Eyes: No changes in vision. ENT: No oral lesions, sore throat.  GI: see HPI.  Heme/Lymph: No easy bruising.  CV: No chest pain.  GU: No hematuria.  Integumentary: No rashes.  Neuro: No headaches.  Psych: No depression/anxiety.  Endocrine: No heat/cold intolerance.  Allergic/Immunologic: No urticaria.  Resp: No cough, SOB.  Musculoskeletal: No joint swelling.    Physical Examination: BP (!) 148/68 (BP Location: Right Arm)   Pulse 87   Temp 98 F (36.7 C)   Resp 18   Ht 5\' 1"  (1.549 m)   Wt 40.4 kg   SpO2 99%   BMI 16.83 kg/m  Gen: NAD, alert and oriented x 4 HEENT: PEERLA, EOMI, Neck: supple, no JVD or thyromegaly Chest: CTA bilaterally, no wheezes, crackles, or other adventitious sounds CV: RRR, no m/g/c/r Abd: soft, NT, ND, +BS in all four quadrants; no HSM, guarding, ridigity, or rebound tenderness Ext: no edema, well perfused with 2+ pulses, Skin: no rash or lesions noted Lymph: no LAD  Data: Lab Results  Component Value Date    WBC 10.2 07/30/2022   HGB 9.2 (L) 07/30/2022   HCT 27.2 (L) 07/30/2022   MCV 90.1 07/30/2022   PLT 220 07/30/2022   Recent Labs  Lab 07/29/22 0514 07/29/22 1613 07/30/22 0400  HGB 7.9* 10.3* 9.2*   Lab Results  Component Value Date   NA 140 07/30/2022   K 3.9 07/30/2022   CL 110 07/30/2022   CO2 26 07/30/2022   BUN 39 (H) 07/30/2022   CREATININE 0.99 07/30/2022   Lab Results  Component Value Date   ALT 10 07/28/2022   AST 16 07/28/2022   ALKPHOS 48 07/28/2022   BILITOT 0.4 07/28/2022   Recent Labs  Lab 07/27/22 2136  APTT 31  INR 1.2   Assessment/Plan:  88 y/o Caucasian female with a PMH of HTN, HLD, SCC of skin, Hx of CVA on chronic antiplatelet therapy with Plavix, and hx of GI bleed presented to the [facility] ED 8/30 for syncopal episode and concerns for LGI bleeding. GI following and recommending conservative treatment.   LGI bleed - suspect diverticular bleed  Syncopal episode  Normocytic anemia  DAPT use - Plavix held (last dose Wednesday)  Advanced age   Recommendations:  - No gross gastrointestinal bleeding - H&H stable with no overt signs of GI bleeding - Continue to monitor H&H closely. Transfuse for Hgb <7.0.  - No plans for endoscopic procedures at present time. Family and patient prefer conservative management which is reasonable and I agree with.  - Continue PPI PO - Plavix has been decided to be d/c'd which is  reasonable  - Avoid NSAIDs - Advance diet as tolerated - If no further bleeding and H&H remain stable, can likely d/c tomorrow - High-fiber diet advised indefinitely. Bowel regimen with stool softener.   Please call with questions or concerns.   Jacob Moores, PA-C [facility] [phone]

## 2022-07-31 DIAGNOSIS — R55 Syncope and collapse: Secondary | ICD-10-CM | POA: Diagnosis not present

## 2022-07-31 LAB — CBC
HCT: 24.6 % — ABNORMAL LOW (ref 36.0–46.0)
Hemoglobin: 8.1 g/dL — ABNORMAL LOW (ref 12.0–15.0)
MCH: 30.1 pg (ref 26.0–34.0)
MCHC: 32.9 g/dL (ref 30.0–36.0)
MCV: 91.4 fL (ref 80.0–100.0)
Platelets: 229 10*3/uL (ref 150–400)
RBC: 2.69 MIL/uL — ABNORMAL LOW (ref 3.87–5.11)
RDW: 14.3 % (ref 11.5–15.5)
WBC: 11 10*3/uL — ABNORMAL HIGH (ref 4.0–10.5)
nRBC: 0 % (ref 0.0–0.2)

## 2022-07-31 LAB — BASIC METABOLIC PANEL
Anion gap: 6 (ref 5–15)
BUN: 52 mg/dL — ABNORMAL HIGH (ref 8–23)
CO2: 24 mmol/L (ref 22–32)
Calcium: 9.3 mg/dL (ref 8.9–10.3)
Chloride: 107 mmol/L (ref 98–111)
Creatinine, Ser: 0.98 mg/dL (ref 0.44–1.00)
GFR, Estimated: 53 mL/min — ABNORMAL LOW (ref >60)
Glucose, Bld: 104 mg/dL — ABNORMAL HIGH (ref 70–99)
Potassium: 3.8 mmol/L (ref 3.5–5.1)
Sodium: 137 mmol/L (ref 135–145)

## 2022-07-31 LAB — MAGNESIUM: Magnesium: 2.1 mg/dL (ref 1.7–2.4)

## 2022-07-31 MED ORDER — OCUVITE-LUTEIN PO CAPS
1.0000 | ORAL_CAPSULE | Freq: Every day | ORAL | Status: DC
  Administered 2022-08-01: 1 via ORAL
  Filled 2022-07-31: qty 1

## 2022-07-31 MED ORDER — VITAMIN D 25 MCG (1000 UNIT) PO TABS
1000.0000 [IU] | ORAL_TABLET | Freq: Every day | ORAL | Status: DC
  Administered 2022-08-01: 1000 [IU] via ORAL
  Filled 2022-07-31 (×2): qty 1

## 2022-07-31 MED ORDER — TIMOLOL MALEATE 0.5 % OP SOLN
1.0000 [drp] | Freq: Two times a day (BID) | OPHTHALMIC | Status: DC
Start: 2022-07-31 — End: 2022-08-01
  Administered 2022-07-31: 1 [drp] via OPHTHALMIC
  Filled 2022-07-31: qty 5

## 2022-07-31 MED ORDER — SIMVASTATIN 20 MG PO TABS
40.0000 mg | ORAL_TABLET | Freq: Every day | ORAL | Status: DC
  Administered 2022-07-31: 40 mg via ORAL
  Filled 2022-07-31: qty 2

## 2022-07-31 MED ORDER — BRIMONIDINE TARTRATE-TIMOLOL 0.2-0.5 % OP SOLN
1.0000 [drp] | Freq: Two times a day (BID) | OPHTHALMIC | Status: DC
Start: 2022-07-31 — End: 2022-07-31

## 2022-07-31 MED ORDER — AMLODIPINE BESYLATE 5 MG PO TABS
5.0000 mg | ORAL_TABLET | Freq: Every day | ORAL | Status: DC
  Administered 2022-07-31 – 2022-08-01 (×2): 5 mg via ORAL
  Filled 2022-07-31 (×2): qty 1

## 2022-07-31 MED ORDER — LYSINE 500 MG PO TABS: 500.0000 mg | ORAL_TABLET | Freq: Every day | ORAL | Status: DC

## 2022-07-31 MED ORDER — METOPROLOL SUCCINATE ER 25 MG PO TB24
25.0000 mg | ORAL_TABLET | Freq: Every day | ORAL | Status: DC
  Administered 2022-07-31 – 2022-08-01 (×2): 25 mg via ORAL
  Filled 2022-07-31 (×2): qty 1

## 2022-07-31 MED ORDER — LATANOPROST 0.005 % OP SOLN
1.0000 [drp] | Freq: Every day | OPHTHALMIC | Status: DC
  Administered 2022-07-31: 1 [drp] via OPHTHALMIC
  Filled 2022-07-31: qty 2.5

## 2022-07-31 MED ORDER — PREDNISOLONE ACETATE 0.12 % OP SUSP
1.0000 [drp] | Freq: Every day | OPHTHALMIC | Status: DC
Start: 2022-07-31 — End: 2022-08-01
  Filled 2022-07-31: qty 5

## 2022-07-31 MED ORDER — BRIMONIDINE TARTRATE 0.2 % OP SOLN
1.0000 [drp] | Freq: Two times a day (BID) | OPHTHALMIC | Status: DC
  Administered 2022-07-31: 1 [drp] via OPHTHALMIC
  Filled 2022-07-31: qty 5

## 2022-07-31 MED ORDER — TROLAMINE SALICYLATE 10 % EX CREA
1.0000 | TOPICAL_CREAM | CUTANEOUS | Status: DC | PRN
Start: 2022-07-31 — End: 2022-08-01

## 2022-07-31 NOTE — Progress Notes (Signed)
Physical Therapy Treatment Patient Details Name: Debbie Foster MRN: [id number] DOB: 10/13/1925 Today's Date: 07/31/2022   History of Present Illness Pt is a 88 year old female admitted following feeling lightheaded and passing out while on the toilet. PMH significant for hypertension, migraines, stroke    PT Comments    Patient received in bed, 2 daughters present in room . Patient is HOH, agrees to PT session. Pleasant and oriented throughout session. She is mod independent with bed mobility, min guard for transfers and gait. Patient ambulated 30 feet with RW. She is limited by weakness and fatigue. Patient will continue to benefit from skilled PT while here to improve strength, endurance and safety with mobility.      Recommendations for follow up therapy are one component of a multi-disciplinary discharge planning process, led by the attending physician.  Recommendations may be updated based on patient status, additional functional criteria and insurance authorization.  Follow Up Recommendations  Home health PT     Assistance Recommended at Discharge Frequent or constant Supervision/Assistance  Patient can return home with the following Help with stairs or ramp for entrance;Assistance with cooking/housework;Assist for transportation;A little help with walking and/or transfers;A little help with bathing/dressing/bathroom   Equipment Recommendations  None recommended by PT    Recommendations for Other Services       Precautions / Restrictions Precautions Precautions: Fall Restrictions Weight Bearing Restrictions: No     Mobility  Bed Mobility Overal bed mobility: Modified Independent             General bed mobility comments: Pt did well getting to EOB w/o direct assist    Transfers Overall transfer level: Needs assistance Equipment used: Rolling walker (2 wheels) Transfers: Sit to/from Stand Sit to Stand: Min guard           General transfer comment: Pt needed  some extra time and cuing, but was able to rise w/o phyiscal assist.  Cues needed for hand placement    Ambulation/Gait Ambulation/Gait assistance: Min guard, Supervision Gait Distance (Feet): 30 Feet Assistive device: Rolling walker (2 wheels) Gait Pattern/deviations: Step-through pattern, Decreased step length - right, Decreased step length - left, Narrow base of support, Decreased stride length Gait velocity: decreased     General Gait Details: Slow steady gait, limited by fatigue. Has not been up out of bed since evaluation 3 days ago   Social research officer, government Rankin (Stroke Patients Only)       Balance Overall balance assessment: Needs assistance Sitting-balance support: Feet supported Sitting balance-Leahy Scale: Good     Standing balance support: Bilateral upper extremity supported, During functional activity, Reliant on assistive device for balance Standing balance-Leahy Scale: Fair                              Cognition Arousal/Alertness: Awake/alert Behavior During Therapy: WFL for tasks assessed/performed Overall Cognitive Status: Within Functional Limits for tasks assessed                                          Exercises      General Comments        Pertinent Vitals/Pain Pain Assessment Pain Assessment: No/denies pain    Home Living  Prior Function            PT Goals (current goals can now be found in the care plan section) Acute Rehab PT Goals Patient Stated Goal: go home PT Goal Formulation: With patient/family Time For Goal Achievement: 08/10/22 Potential to Achieve Goals: Good Progress towards PT goals: Progressing toward goals    Frequency    Min 2X/week      PT Plan Current plan remains appropriate    Co-evaluation              AM-PAC PT "6 Clicks" Mobility   Outcome Measure  Help needed turning from your back to  your side while in a flat bed without using bedrails?: None Help needed moving from lying on your back to sitting on the side of a flat bed without using bedrails?: None Help needed moving to and from a bed to a chair (including a wheelchair)?: A Little Help needed standing up from a chair using your arms (e.g., wheelchair or bedside chair)?: A Little Help needed to walk in hospital room?: A Little Help needed climbing 3-5 steps with a railing? : A Little 6 Click Score: 20    End of Session Equipment Utilized During Treatment: Gait belt Activity Tolerance: Patient limited by fatigue Patient left: in chair;with call bell/phone within reach;with family/visitor present Nurse Communication: Mobility status PT Visit Diagnosis: Muscle weakness (generalized) (M62.81);Difficulty in walking, not elsewhere classified (R26.2);Unsteadiness on feet (R26.81)     Time: 1914-7829 PT Time Calculation (min) (ACUTE ONLY): 22 min  Charges:  $Gait Training: 8-22 mins                     Ziya Coonrod, PT, GCS 07/31/22,2:08 PM

## 2022-07-31 NOTE — Progress Notes (Signed)
PROGRESS NOTE    Debbie Foster  QMV:[id number] DOB: 02/06/1925 DOA: 07/27/2022 PCP: Alan Mulder, MD   Brief Narrative:  88 year old with past medical history of GI bleed, HTN, HLD, SCC of skin, GERD, CVA comes to the hospital after having an episode of syncope in the bathroom.  She also reports of dark stools and has been on Plavix.  She pressed her life alert button during this event and EMS brought her to the hospital.  In the ED her hemoglobin was 10, stool guaiac was positive, CT head was negative.  Upon admission she was started on PPI, Plavix and aspirin were discontinued, seen by GI.  Due to drop in hemoglobin she required 1 unit PRBC transfusion.   Assessment & Plan:  Principal Problem:   Syncope and collapse Active Problems:   Anemia   At risk for falls   Gastrointestinal hemorrhage with melena     Assessment and Plan: * Syncope and collapse Suspect from underlying bleeding versus vasovagal episode.  CTH/C-spine = negative. XR shows some left ankle swelling. Hemoglobin stable we will continue to monitor.  Baseline 10.0.  Discussed with GI, conservative management.  Will discontinue Plavix. PPI BID x 8 week, daily thereafter.  Outpatient follow-up.  Continue aspirin upon discharge - Echocardiogram-EF 65%, grade 1 DD - TSH = Normal.  -PT/OT = HH   Anemia of Chronic Disease  Acute Blood Loss Anemia -Anemia of chronic disease. Baseline Hb 10. HB today 10.2 > 8.2 > 6.8.  Status post 2 units PRBC transfusion.  Hemoglobin still drifting down.   Chest discomfort, resolved Elevated Trops -Demand ischemia.  Trops trending down. EKG= no acute ST T changes. Echo EF 65%, grade 1 DD.  We will hold off on pursuing any aggressive work-up given her age and comorbidities.  Essential HTN -Currently home p.o. medications on hold.  We will place her on IV as needed  HLD -Statin  Hx of CVA? -ASA/Plavix on hold.   Hx of SCC of her Skin -Seen by dermatology back in 2016.   Appears to be stable   At risk for falls Fall precautions.    DVT prophylaxis: SCDs Code Status: DNR confirmed by family.  Family Communication: Daughter at bedside  [facility] stay until Hb is somewhat more stable.   Subjective: Had an episode of dark stool yesterday evening but no overt bleeding.  Denies any complaints this morning  Examination: Constitutional: Not in acute distress.  Elderly frail Respiratory: Clear to auscultation bilaterally Cardiovascular: Normal sinus rhythm, no rubs Abdomen: Nontender nondistended good bowel sounds Musculoskeletal: No edema noted Skin: No rashes seen Neurologic: CN 2-12 grossly intact.  And nonfocal Psychiatric: Normal judgment and insight. Alert and oriented x 3. Normal mood.  Objective: Vitals:   07/30/22 1623 07/30/22 1944 07/31/22 0318 07/31/22 0734  BP: (!) 122/52 (!) 131/59 (!) 156/61 (!) 149/57  Pulse: 74 79 82 82  Resp: 20 20 16 16   Temp: 97.8 F (36.6 C) 98.3 F (36.8 C) (!) 97.3 F (36.3 C) 97.9 F (36.6 C)  TempSrc: Oral Oral Oral Oral  SpO2: 99% 100% 100% 100%  Weight:   40.6 kg   Height:        Intake/Output Summary (Last 24 hours) at 07/31/2022 1015 Last data filed at 07/31/2022 0730 Gross per 24 hour  Intake 300 ml  Output 250 ml  Net 50 ml   Filed Weights   07/28/22 0500 07/30/22 0434 07/31/22 0318  Weight: 41.1 kg 40.4 kg 40.6  kg     Data Reviewed:   CBC: Recent Labs  Lab 07/27/22 2136 07/28/22 0514 07/28/22 2046 07/29/22 0514 07/29/22 1613 07/30/22 0400 07/31/22 0323  WBC 16.0* 12.5*  --  10.9*  --  10.2 11.0*  HGB 10.2* 8.2* 6.8* 7.9* 10.3* 9.2* 8.1*  HCT 32.2* 25.1* 20.9* 23.3* 30.6* 27.2* 24.6*  MCV 94.7 94.0  --  91.7  --  90.1 91.4  PLT 346 275  --  208  --  220 229   Basic Metabolic Panel: Recent Labs  Lab 07/27/22 2136 07/28/22 0514 07/29/22 0302 07/29/22 0514 07/30/22 0400 07/31/22 0323  NA 137 138 137  --  140 137  K 4.2 4.2 4.5  --  3.9 3.8  CL 104 105 109  --   110 107  CO2 24 28 25   --  26 24  GLUCOSE 132* 99 105*  --  90 104*  BUN 29* 30* 34*  --  39* 52*  CREATININE 0.94 0.87 0.96  --  0.99 0.98  CALCIUM 9.6 9.3 8.9  --  8.8* 9.3  MG  --   --  1.9  --  1.7 2.1  PHOS  --   --   --  3.0  --   --    GFR: Estimated Creatinine Clearance: 22 mL/min (by C-G formula based on SCr of 0.98 mg/dL). Liver Function Tests: Recent Labs  Lab 07/28/22 0514  AST 16  ALT 10  ALKPHOS 48  BILITOT 0.4  PROT 6.4*  ALBUMIN 2.8*   No results for input(s): "LIPASE", "AMYLASE" in the last 168 hours. No results for input(s): "AMMONIA" in the last 168 hours. Coagulation Profile: Recent Labs  Lab 07/27/22 2136  INR 1.2   Cardiac Enzymes: No results for input(s): "CKTOTAL", "CKMB", "CKMBINDEX", "TROPONINI" in the last 168 hours. BNP (last 3 results) No results for input(s): "PROBNP" in the last 8760 hours. HbA1C: No results for input(s): "HGBA1C" in the last 72 hours. CBG: No results for input(s): "GLUCAP" in the last 168 hours. Lipid Profile: No results for input(s): "CHOL", "HDL", "LDLCALC", "TRIG", "CHOLHDL", "LDLDIRECT" in the last 72 hours. Thyroid Function Tests: No results for input(s): "TSH", "T4TOTAL", "FREET4", "T3FREE", "THYROIDAB" in the last 72 hours.  Anemia Panel: No results for input(s): "VITAMINB12", "FOLATE", "FERRITIN", "TIBC", "IRON", "RETICCTPCT" in the last 72 hours. Sepsis Labs: No results for input(s): "PROCALCITON", "LATICACIDVEN" in the last 168 hours.  No results found for this or any previous visit (from the past 240 hour(s)).       Radiology Studies: No results found.      Scheduled Meds:  sodium chloride   Intravenous Once   feeding supplement  237 mL Oral BID BM   multivitamin with minerals  1 tablet Oral Daily   pantoprazole  40 mg Oral BID   sodium chloride flush  3 mL Intravenous Q12H   Continuous Infusions:     LOS: 3 days   Time spent= 35 mins    Quentez Lober 2137, MD [facility]  If 7PM-7AM, please contact night-coverage  07/31/2022, 10:15 AM

## 2022-07-31 NOTE — Progress Notes (Signed)
PHARMACIST - PHYSICIAN ORDER COMMUNICATION  CONCERNING: P&T Medication Policy on Herbal Medications  DESCRIPTION:  This patient's order for: Lysine  has been noted.  This product(s) is classified as an "herbal" or natural product. Due to a lack of definitive safety studies or FDA approval, nonstandard manufacturing practices, plus the potential risk of unknown drug-drug interactions while on inpatient medications, the Pharmacy and Therapeutics Committee does not permit the use of "herbal" or natural products of this type within [place].   ACTION TAKEN: The pharmacy department is unable to verify this order at this time and your patient has been informed of this safety policy. Please reevaluate patient's clinical condition at discharge and address if the herbal or natural product(s) should be resumed at that time.  

## 2022-08-01 DIAGNOSIS — R55 Syncope and collapse: Secondary | ICD-10-CM | POA: Diagnosis not present

## 2022-08-01 LAB — CBC
HCT: 24.9 % — ABNORMAL LOW (ref 36.0–46.0)
Hemoglobin: 8 g/dL — ABNORMAL LOW (ref 12.0–15.0)
MCH: 29.9 pg (ref 26.0–34.0)
MCHC: 32.1 g/dL (ref 30.0–36.0)
MCV: 92.9 fL (ref 80.0–100.0)
Platelets: 260 10*3/uL (ref 150–400)
RBC: 2.68 MIL/uL — ABNORMAL LOW (ref 3.87–5.11)
RDW: 14 % (ref 11.5–15.5)
WBC: 10.4 10*3/uL (ref 4.0–10.5)
nRBC: 0 % (ref 0.0–0.2)

## 2022-08-01 LAB — BASIC METABOLIC PANEL
Anion gap: 7 (ref 5–15)
BUN: 48 mg/dL — ABNORMAL HIGH (ref 8–23)
CO2: 27 mmol/L (ref 22–32)
Calcium: 9.7 mg/dL (ref 8.9–10.3)
Chloride: 106 mmol/L (ref 98–111)
Creatinine, Ser: 0.94 mg/dL (ref 0.44–1.00)
GFR, Estimated: 56 mL/min — ABNORMAL LOW (ref >60)
Glucose, Bld: 113 mg/dL — ABNORMAL HIGH (ref 70–99)
Potassium: 3.9 mmol/L (ref 3.5–5.1)
Sodium: 140 mmol/L (ref 135–145)

## 2022-08-01 LAB — MAGNESIUM: Magnesium: 2.1 mg/dL (ref 1.7–2.4)

## 2022-08-01 MED ORDER — PANTOPRAZOLE SODIUM 40 MG PO TBEC: 40.0000 mg | DELAYED_RELEASE_TABLET | Freq: Two times a day (BID) | ORAL | 1 refills | Status: DC

## 2022-08-01 NOTE — Progress Notes (Signed)
Debbie Foster to be D/C'd Home per MD order.  Discussed prescriptions and follow up appointments with the patient. Prescriptions given to patient, medication list explained in detail. Pt verbalized understanding.  Allergies as of 08/01/2022   No Known Allergies      Medication List     STOP taking these medications    clopidogrel 75 MG tablet Commonly known as: PLAVIX       TAKE these medications    aspirin 81 MG tablet Take 81 mg by mouth daily.   bimatoprost 0.03 % ophthalmic solution Commonly known as: LUMIGAN Place 1 drop into the right eye at bedtime.   Coenzyme Q10 100 MG capsule Take 100 mg by mouth daily.   Combigan 0.2-0.5 % ophthalmic solution Generic drug: brimonidine-timolol Place 1 drop into both eyes every 12 (twelve) hours.   Lysine 500 MG Tabs Take 500 mg by mouth daily.   metoprolol succinate 50 MG 24 hr tablet Commonly known as: TOPROL-XL Take 25 mg by mouth daily. Take half a tablet by mouth daily   multivitamin with minerals tablet Take 1 tablet by mouth daily.   PreserVision AREDS 2+Multi Vit Caps Take 1 capsule by mouth daily.   nitroGLYCERIN 0.4 MG SL tablet Commonly known as: NITROSTAT Place 0.4 mg under the tongue every 5 (five) minutes as needed.   Olmesartan-amLODIPine-HCTZ 40-10-12.5 MG Tabs Take 1 tablet by mouth once daily. daily   pantoprazole 40 MG tablet Commonly known as: PROTONIX Take 1 tablet (40 mg total) by mouth 2 (two) times daily before a meal.   prednisoLONE acetate 0.12 % ophthalmic suspension Commonly known as: PRED MILD Place 1 drop into the right eye daily.   simvastatin 40 MG tablet Commonly known as: ZOCOR Take 40 mg by mouth at bedtime.   trolamine salicylate 10 % cream Commonly known as: ASPERCREME Apply 1 application topically as needed for muscle pain.   Vitamin D3 25 MCG (1000 UT) Caps Take 1,000 Units by mouth daily.        Vitals:   08/01/22 0900 08/01/22 0908  BP: (!) 127/47 (!) 128/47   Pulse: 72 75  Resp: 16   Temp:    SpO2: 99% 98%    Skin clean, dry and intact without evidence of skin break down, no evidence of skin tears noted. IV catheter discontinued intact. Site without signs and symptoms of complications. Dressing and pressure applied. Pt denies pain at this time. No complaints noted.  An After Visit Summary was printed and given to the patient. Patient escorted via WC, and D/C home via private auto.  Letitia Sabala C. Jilda Roche

## 2022-08-01 NOTE — TOC Transition Note (Signed)
Transition of Care [facility]) - CM/SW Discharge Note   Patient Details  Name: TEQUITA MARRS MRN: [id number] Date of Birth: 01-Sep-1925  [facility]) CM/SW Contact:  Margarito Liner, LCSW Phone Number: 08/01/2022, 10:55 AM   Clinical Narrative:  Patient has orders to discharge home today. [facility] representative is aware. No further concerns. CSW signing off.  Final next level of care: Home w Home Health Services Barriers to Discharge: Barriers Resolved   Patient Goals and CMS Choice Patient states their goals for this hospitalization and ongoing recovery are:: " I hope I get to go home today" CMS Medicare.gov Compare Post Acute Care list provided to:: Patient Choice offered to / list presented to : NA  Discharge Placement                    Patient and family notified of of transfer: 08/01/22  Discharge Plan and Services                          HH Arranged: RN, PT, OT [facility] Agency: [facility] Date [facility] Agency Contacted: 08/01/22   Representative spoke with at [facility] Agency: Becky Sax  Social Determinants of Health (SDOH) Interventions     Readmission Risk Interventions     No data to display

## 2022-08-01 NOTE — Discharge Summary (Signed)
Physician Discharge Summary  JOUA BAKE WUJ:[id number] DOB: October 30, 1925 DOA: 07/27/2022  PCP: Alan Mulder, MD  Admit date: 07/27/2022 Discharge date: 08/01/2022  Admitted From: Home Disposition:  Home  Recommendations for Outpatient Follow-up:  Follow up with PCP in 1-2 weeks Please obtain BMP/CBC in one week your next doctors visit.  Okay to resume aspirin per GI.  Discontinue Plavix PPI twice daily for minimum 8 weeks thereafter daily  Home Health: PT/OT/RN Equipment/Devices: None Discharge Condition: Stable CODE STATUS: DNR Diet recommendation: Regular  Brief/Interim Summary:  88 year old with past medical history of GI bleed, HTN, HLD, SCC of skin, GERD, CVA comes to the hospital after having an episode of syncope in the bathroom.  She also reports of dark stools and has been on Plavix.  She pressed her life alert button during this event and EMS brought her to the hospital.  In the ED her hemoglobin was 10, stool guaiac was positive, CT head was negative.  Upon admission she was started on PPI, Plavix and aspirin were discontinued, seen by GI.  Due to drop in hemoglobin patient required PRBC transfusion.  Eventually her hemoglobin stabilized.  PT/OT recommended resuming home health services. Daughter updated at bedside every day during this hospitalization even on the day of discharge.     Assessment & Plan:  Principal Problem:   Syncope and collapse Active Problems:   Anemia   At risk for falls   Gastrointestinal hemorrhage with melena       Assessment and Plan: * Syncope and collapse Suspect from underlying bleeding versus vasovagal episode.  CTH/C-spine = negative. XR shows some left ankle swelling. Hemoglobin stable we will continue to monitor.  Baseline 10.0.  Discussed with GI, conservative management.  Will discontinue Plavix. PPI BID x 8 week, daily thereafter.  Outpatient follow-up.  Continue aspirin upon discharge - Echocardiogram-EF 65%, grade 1 DD - TSH  = Normal.  -PT/OT = HH, services to resume     Anemia of Chronic Disease  Acute Blood Loss Anemia -Anemia of chronic disease. Baseline Hb 10. HB today 10.2 > 8.2 > 6.8.  Status post 2 units PRBC transfusion.  Hb is now stable around 8.0.  Advised to repeat with PCP in next 3-4 days   Chest discomfort, resolved Elevated Trops -Demand ischemia.  Trops trending down. EKG= no acute ST T changes. Echo EF 65%, grade 1 DD.  We will hold off on pursuing any aggressive work-up given her age and comorbidities.   Essential HTN - Resume home regimen  HLD -Statin   Hx of CVA? - Discontinue Plavix going forward.  Resume aspirin   Hx of SCC of her Skin -Seen by dermatology back in 2016.  Appears to be stable     At risk for falls Fall precautions.        Discharge Diagnoses:  Principal Problem:   Syncope and collapse Active Problems:   Anemia   At risk for falls   Gastrointestinal hemorrhage with melena      Consultations: Gastroenterology  Subjective: Seen and examined at bedside, no complaints this morning.  Doing well wishes to go home.  Daughters are present at bedside as well  Discharge Exam: Vitals:   08/01/22 0900 08/01/22 0908  BP: (!) 127/47 (!) 128/47  Pulse: 72 75  Resp: 16   Temp:    SpO2: 99% 98%   Vitals:   07/31/22 1927 08/01/22 0452 08/01/22 0900 08/01/22 0908  BP: (!) 140/60 (!) 144/59 (!) 127/47 (!) 128/47  Pulse: 75 69 72 75  Resp: Temp: 98 F (36.7 C) 97.8 F (36.6 C)    TempSrc: Oral Oral    SpO2: 98% 97% 99% 98%  Weight:  37.5 kg    Height:        General: Pt is alert, awake, not in acute distress Cardiovascular: RRR, S1/S2 +, no rubs, no gallops Respiratory: CTA bilaterally, no wheezing, no rhonchi Abdominal: Soft, NT, ND, bowel sounds + Extremities: no edema, no cyanosis  Discharge Instructions  Discharge Instructions     Face-to-face encounter (required for Medicare/Medicaid patients)   Complete by: As directed     I Cris Talavera Chirag Fayetta Sorenson certify that this patient is under my care and that I, or a nurse practitioner or physician's assistant working with me, had a face-to-face encounter that meets the physician face-to-face encounter requirements with this patient on 08/01/2022. The encounter with the patient was in whole, or in part for the following medical condition(s) which is the primary reason for home health care weakness, gi bleed.   The encounter with the patient was in whole, or in part, for the following medical condition, which is the primary reason for home health care: weakness, gi bleed   I certify that, based on my findings, the following services are medically necessary home health services:  Physical therapy Nursing     Reason for Medically Necessary Home Health Services:  Skilled Nursing- Skilled Assessment/Observation Therapy- Therapeutic Exercises to Increase Strength and Endurance     My clinical findings support the need for the above services: Unsafe ambulation due to balance issues   Further, I certify that my clinical findings support that this patient is homebound due to: Unable to leave home safely without assistance   Home Health   Complete by: As directed    To provide the following care/treatments:  PT OT RN        Allergies as of 08/01/2022   No Known Allergies      Medication List     STOP taking these medications    clopidogrel 75 MG tablet Commonly known as: PLAVIX       TAKE these medications    aspirin 81 MG tablet Take 81 mg by mouth daily.   bimatoprost 0.03 % ophthalmic solution Commonly known as: LUMIGAN Place 1 drop into the right eye at bedtime.   Coenzyme Q10 100 MG capsule Take 100 mg by mouth daily.   Combigan 0.2-0.5 % ophthalmic solution Generic drug: brimonidine-timolol Place 1 drop into both eyes every 12 (twelve) hours.   Lysine 500 MG Tabs Take 500 mg by mouth daily.   metoprolol succinate 50 MG 24 hr tablet Commonly known as:  TOPROL-XL Take 25 mg by mouth daily. Take half a tablet by mouth daily   multivitamin with minerals tablet Take 1 tablet by mouth daily.   PreserVision AREDS 2+Multi Vit Caps Take 1 capsule by mouth daily.   nitroGLYCERIN 0.4 MG SL tablet Commonly known as: NITROSTAT Place 0.4 mg under the tongue every 5 (five) minutes as needed.   Olmesartan-amLODIPine-HCTZ 40-10-12.5 MG Tabs Take 1 tablet by mouth once daily. daily   pantoprazole 40 MG tablet Commonly known as: PROTONIX Take 1 tablet (40 mg total) by mouth 2 (two) times daily before a meal.   prednisoLONE acetate 0.12 % ophthalmic suspension Commonly known as: PRED MILD Place 1 drop into the right eye daily.   simvastatin 40 MG tablet Commonly known as: ZOCOR Take 40  mg by mouth at bedtime.   trolamine salicylate 10 % cream Commonly known as: ASPERCREME Apply 1 application topically as needed for muscle pain.   Vitamin D3 25 MCG (1000 UT) Caps Take 1,000 Units by mouth daily.        Follow-up Information     Morayati, Delsa Sale, MD Follow up in 1 week(s).   Specialty: Endocrinology Why: Call to make appointment. Contact information: [address] [place] [address] [phone]         [facility] Follow up.   Why: They will resume home health services at discharge. Contact information: [address] [place] [place] [address] [phone]                No Known Allergies  You were cared for by a hospitalist during your hospital stay. If you have any questions about your discharge medications or the care you received while you were in the hospital after you are discharged, you can call the unit and asked to speak with the hospitalist on call if the hospitalist that took care of you is not available. Once you are discharged, your primary care physician will handle any further medical issues. Please note that no refills for any discharge medications will be authorized once you  are discharged, as it is imperative that you return to your primary care physician (or establish a relationship with a primary care physician if you do not have one) for your aftercare needs so that they can reassess your need for medications and monitor your lab values.   Procedures/Studies: ECHOCARDIOGRAM COMPLETE  Result Date: 07/28/2022    ECHOCARDIOGRAM REPORT   Patient Name:   Debbie Foster [facility] Date of Exam: 07/28/2022 Medical Rec #:  [id number]     Height:       61.0 in Accession #:    [id number]    Weight:       90.6 lb Date of Birth:  May 11, 1925     BSA:          1.349 m Patient Age:    88 years      BP:           15/79 mmHg Patient Gender: F             HR:           86 bpm. Exam Location:  [facility] Procedure: 2D Echo, Cardiac Doppler and Color Doppler Indications:     R55 Syncope  History:         Patient has no prior history of Echocardiogram examinations.                  Stroke; Risk Factors:Hypertension and GERD.  Sonographer:     Eulah Pont RDCS Referring Phys:  [id number] [facility] Kathleen Likins Diagnosing Phys: Julien Nordmann MD IMPRESSIONS  1. Left ventricular ejection fraction, by estimation, is 60 to 65%. The left ventricle has normal function. The left ventricle has no regional wall motion abnormalities. Left ventricular diastolic parameters are consistent with Grade I diastolic dysfunction (impaired relaxation).  2. Right ventricular systolic function is normal. The right ventricular size is normal. There is normal pulmonary artery systolic pressure. The estimated right ventricular systolic pressure is 28.6 mmHg.  3. The mitral valve is normal in structure. Mild mitral valve regurgitation. No evidence of mitral stenosis.  4. Tricuspid valve regurgitation is mild to moderate.  5. The aortic valve is normal in structure. There is moderate calcification of the  aortic valve. Aortic valve regurgitation is mild. Aortic valve sclerosis/calcification is present, without any evidence of aortic stenosis.  6. The  inferior vena cava is normal in size with greater than 50% respiratory variability, suggesting right atrial pressure of 3 mmHg. FINDINGS  Left Ventricle: Left ventricular ejection fraction, by estimation, is 60 to 65%. The left ventricle has normal function. The left ventricle has no regional wall motion abnormalities. The left ventricular internal cavity size was normal in size. There is  no left ventricular hypertrophy. Left ventricular diastolic parameters are consistent with Grade I diastolic dysfunction (impaired relaxation). Right Ventricle: The right ventricular size is normal. No increase in right ventricular wall thickness. Right ventricular systolic function is normal. There is normal pulmonary artery systolic pressure. The tricuspid regurgitant velocity is 2.53 m/s, and  with an assumed right atrial pressure of 3 mmHg, the estimated right ventricular systolic pressure is 28.6 mmHg. Left Atrium: Left atrial size was normal in size. Right Atrium: Right atrial size was normal in size. Pericardium: There is no evidence of pericardial effusion. Mitral Valve: The mitral valve is normal in structure. Mild mitral valve regurgitation. No evidence of mitral valve stenosis. Tricuspid Valve: The tricuspid valve is normal in structure. Tricuspid valve regurgitation is mild to moderate. No evidence of tricuspid stenosis. Aortic Valve: The aortic valve is normal in structure. There is moderate calcification of the aortic valve. Aortic valve regurgitation is mild. Aortic regurgitation PHT measures 285 msec. Aortic valve sclerosis/calcification is present, without any evidence of aortic stenosis. Pulmonic Valve: The pulmonic valve was normal in structure. Pulmonic valve regurgitation is not visualized. No evidence of pulmonic stenosis. Aorta: The aortic root is normal in size and structure. Venous: The inferior vena cava is normal in size with greater than 50% respiratory variability, suggesting right atrial pressure of 3  mmHg. IAS/Shunts: No atrial level shunt detected by color flow Doppler.  LEFT VENTRICLE PLAX 2D LVIDd:         2.90 cm     Diastology LVIDs:         1.70 cm     LV e' medial:    4.68 cm/s LV PW:         1.20 cm     LV E/e' medial:  12.9 LV IVS:        1.10 cm     LV e' lateral:   5.22 cm/s LVOT diam:     2.10 cm     LV E/e' lateral: 11.6 LV SV:         60 LV SV Index:   44 LVOT Area:     3.46 cm  LV Volumes (MOD) LV vol d, MOD A2C: 42.9 ml LV vol d, MOD A4C: 48.7 ml LV vol s, MOD A2C: 16.2 ml LV vol s, MOD A4C: 20.7 ml LV SV MOD A2C:     26.7 ml LV SV MOD A4C:     48.7 ml LV SV MOD BP:      27.7 ml RIGHT VENTRICLE RV S prime:     13.90 cm/s TAPSE (M-mode): 1.9 cm LEFT ATRIUM             Index        RIGHT ATRIUM           Index LA diam:        3.30 cm 2.45 cm/m   RA Area:     10.00 cm LA Vol (A2C):   26.7 ml 19.79 ml/m  RA Volume:  20.20 ml  14.97 ml/m LA Vol (A4C):   30.0 ml 22.24 ml/m LA Biplane Vol: 28.6 ml 21.20 ml/m  AORTIC VALVE LVOT Vmax:   114.00 cm/s LVOT Vmean:  67.300 cm/s LVOT VTI:    0.172 m AI PHT:      285 msec  AORTA Ao Root diam: 2.90 cm Ao Asc diam:  3.30 cm MITRAL VALVE                TRICUSPID VALVE MV Area (PHT): 3.97 cm     TR Peak grad:   25.6 mmHg MV Decel Time: 191 msec     TR Vmax:        253.00 cm/s MV E velocity: 60.60 cm/s MV A velocity: 121.00 cm/s  SHUNTS MV E/A ratio:  0.50         Systemic VTI:  0.17 m                             Systemic Diam: 2.10 cm Julien Nordmann MD Electronically signed by Julien Nordmann MD Signature Date/Time: 07/28/2022/4:55:44 PM    Final    CT Head Wo Contrast  Result Date: 07/27/2022 CLINICAL DATA:  Syncope with altered mental status. EXAM: CT HEAD WITHOUT CONTRAST TECHNIQUE: Contiguous axial images were obtained from the base of the skull through the vertex without intravenous contrast. RADIATION DOSE REDUCTION: This exam was performed according to the departmental dose-optimization program which includes automated exposure control, adjustment  of the mA and/or kV according to patient size and/or use of iterative reconstruction technique. COMPARISON:  July 14, 2022 FINDINGS: Brain: There is mild cerebral atrophy with widening of the extra-axial spaces and ventricular dilatation. There are areas of decreased attenuation within the white matter tracts of the supratentorial brain, consistent with microvascular disease changes. Vascular: There is marked severity bilateral cavernous carotid artery calcification. Skull: Normal. Negative for fracture or focal lesion. Sinuses/Orbits: No acute finding. Other: None. IMPRESSION: 1. No acute intracranial abnormality. 2. Generalized cerebral atrophy with chronic white matter small vessel ischemic changes. Electronically Signed   By: Aram Candela M.D.   On: 07/27/2022 23:19   DG Knee Complete 4 Views Right  Result Date: 07/14/2022 CLINICAL DATA:  Mechanical fall at home. EXAM: RIGHT KNEE - COMPLETE 4+ VIEW COMPARISON:  Remote radiograph 09/24/2006 FINDINGS: Right knee arthroplasty in expected alignment. No acute or periprosthetic fracture. There is no periprosthetic lucency. There has been patellar resurfacing. No significant knee joint effusion. Small quadriceps tendon enthesophyte. Vascular calcifications are seen. IMPRESSION: Intact right knee arthroplasty without complication. No acute or periprosthetic fracture. Electronically Signed   By: Narda Rutherford M.D.   On: 07/14/2022 22:45   DG Ankle Complete Left  Result Date: 07/14/2022 CLINICAL DATA:  Fall with ankle pain. EXAM: LEFT ANKLE COMPLETE - 3+ VIEW; LEFT FOOT - COMPLETE 3+ VIEW COMPARISON:  None Available. FINDINGS: Left ankle: There is diffuse soft tissue swelling surrounding the ankle, particularly medially. There is no acute fracture or dislocation. The bones are osteopenic. Left foot: There is no acute fracture or dislocation identified. The bones are osteopenic. There are moderate degenerative changes of the first metatarsophalangeal  joint with joint space narrowing and osteophyte formation. Mild hallux valgus is present. IMPRESSION: 1. Soft tissue swelling of the left ankle. 2. No acute fracture of the left ankle or left foot. Electronically Signed   By: Darliss Cheney M.D.   On: 07/14/2022 22:43   DG Foot Complete Left  Result Date: 07/14/2022 CLINICAL DATA:  Fall with ankle pain. EXAM: LEFT ANKLE COMPLETE - 3+ VIEW; LEFT FOOT - COMPLETE 3+ VIEW COMPARISON:  None Available. FINDINGS: Left ankle: There is diffuse soft tissue swelling surrounding the ankle, particularly medially. There is no acute fracture or dislocation. The bones are osteopenic. Left foot: There is no acute fracture or dislocation identified. The bones are osteopenic. There are moderate degenerative changes of the first metatarsophalangeal joint with joint space narrowing and osteophyte formation. Mild hallux valgus is present. IMPRESSION: 1. Soft tissue swelling of the left ankle. 2. No acute fracture of the left ankle or left foot. Electronically Signed   By: Darliss Cheney M.D.   On: 07/14/2022 22:43   CT Head Wo Contrast  Result Date: 07/14/2022 CLINICAL DATA:  Head trauma, minor (Age >= 65y); fall EXAM: CT HEAD WITHOUT CONTRAST CT CERVICAL SPINE WITHOUT CONTRAST TECHNIQUE: Multidetector CT imaging of the head and cervical spine was performed following the standard protocol without intravenous contrast. Multiplanar CT image reconstructions of the cervical spine were also generated. RADIATION DOSE REDUCTION: This exam was performed according to the departmental dose-optimization program which includes automated exposure control, adjustment of the mA and/or kV according to patient size and/or use of iterative reconstruction technique. COMPARISON:  None Available. FINDINGS: CT HEAD FINDINGS Brain: Patchy and confluent areas of decreased attenuation are noted throughout the deep and periventricular white matter of the cerebral hemispheres bilaterally, compatible with  chronic microvascular ischemic disease. No evidence of large-territorial acute infarction. No parenchymal hemorrhage. No mass lesion. No extra-axial collection. No mass effect or midline shift. No hydrocephalus. Basilar cisterns are patent. Vascular: No hyperdense vessel. Atherosclerotic calcifications are present within the cavernous internal carotid and vertebral arteries. Skull: No acute fracture or focal lesion. Sinuses/Orbits: Paranasal sinuses and mastoid air cells are clear. Bilateral lens replacement. Otherwise the orbits are unremarkable. Other: None. CT CERVICAL SPINE FINDINGS Alignment: Grade 1 anterolisthesis of C3 on C4, C4 on C5, C7 on T1, T1 on T2, T2 on T3. Skull base and vertebrae: Multilevel severe degenerative changes of the spine most prominent at the C5 through C7 levels. Ligamentum flavum calcifications. No acute fracture. No aggressive appearing focal osseous lesion or focal pathologic process. Soft tissues and spinal canal: No prevertebral fluid or swelling. No visible canal hematoma. Upper chest: Biapical pleural/pulmonary scarring. 4 mm right apical pulmonary nodule (2:70). Other: None. IMPRESSION: 1. No acute intracranial abnormality. 2. No acute displaced fracture or traumatic listhesis of the cervical spine. Electronically Signed   By: Tish Frederickson M.D.   On: 07/14/2022 22:39   CT Cervical Spine Wo Contrast  Result Date: 07/14/2022 CLINICAL DATA:  Head trauma, minor (Age >= 65y); fall EXAM: CT HEAD WITHOUT CONTRAST CT CERVICAL SPINE WITHOUT CONTRAST TECHNIQUE: Multidetector CT imaging of the head and cervical spine was performed following the standard protocol without intravenous contrast. Multiplanar CT image reconstructions of the cervical spine were also generated. RADIATION DOSE REDUCTION: This exam was performed according to the departmental dose-optimization program which includes automated exposure control, adjustment of the mA and/or kV according to patient size and/or use  of iterative reconstruction technique. COMPARISON:  None Available. FINDINGS: CT HEAD FINDINGS Brain: Patchy and confluent areas of decreased attenuation are noted throughout the deep and periventricular white matter of the cerebral hemispheres bilaterally, compatible with chronic microvascular ischemic disease. No evidence of large-territorial acute infarction. No parenchymal hemorrhage. No mass lesion. No extra-axial collection. No mass effect or midline shift. No hydrocephalus. Basilar cisterns are patent.  Vascular: No hyperdense vessel. Atherosclerotic calcifications are present within the cavernous internal carotid and vertebral arteries. Skull: No acute fracture or focal lesion. Sinuses/Orbits: Paranasal sinuses and mastoid air cells are clear. Bilateral lens replacement. Otherwise the orbits are unremarkable. Other: None. CT CERVICAL SPINE FINDINGS Alignment: Grade 1 anterolisthesis of C3 on C4, C4 on C5, C7 on T1, T1 on T2, T2 on T3. Skull base and vertebrae: Multilevel severe degenerative changes of the spine most prominent at the C5 through C7 levels. Ligamentum flavum calcifications. No acute fracture. No aggressive appearing focal osseous lesion or focal pathologic process. Soft tissues and spinal canal: No prevertebral fluid or swelling. No visible canal hematoma. Upper chest: Biapical pleural/pulmonary scarring. 4 mm right apical pulmonary nodule (2:70). Other: None. IMPRESSION: 1. No acute intracranial abnormality. 2. No acute displaced fracture or traumatic listhesis of the cervical spine. Electronically Signed   By: Tish Frederickson M.D.   On: 07/14/2022 22:39     The results of significant diagnostics from this hospitalization (including imaging, microbiology, ancillary and laboratory) are listed below for reference.     Microbiology: No results found for this or any previous visit (from the past 240 hour(s)).   Labs: BNP (last 3 results) Recent Labs    07/28/22 2303  BNP 126.4*    Basic Metabolic Panel: Recent Labs  Lab 07/28/22 0514 07/29/22 0302 07/29/22 0514 07/30/22 0400 07/31/22 0323 08/01/22 0406  NA 138 137  --  140 137 140  K 4.2 4.5  --  3.9 3.8 3.9  CL 105 109  --  110 107 106  CO2 28 25  --  GLUCOSE 99 105*  --  90 104* 113*  BUN 30* 34*  --  39* 52* 48*  CREATININE 0.87 0.96  --  0.99 0.98 0.94  CALCIUM 9.3 8.9  --  8.8* 9.3 9.7  MG  --  1.9  --  1.7 2.1 2.1  PHOS  --   --  3.0  --   --   --    Liver Function Tests: Recent Labs  Lab 07/28/22 0514  AST 16  ALT 10  ALKPHOS 48  BILITOT 0.4  PROT 6.4*  ALBUMIN 2.8*   No results for input(s): "LIPASE", "AMYLASE" in the last 168 hours. No results for input(s): "AMMONIA" in the last 168 hours. CBC: Recent Labs  Lab 07/28/22 0514 07/28/22 2046 07/29/22 0514 07/29/22 1613 07/30/22 0400 07/31/22 0323 08/01/22 0406  WBC 12.5*  --  10.9*  --  10.2 11.0* 10.4  HGB 8.2*   < > 7.9* 10.3* 9.2* 8.1* 8.0*  HCT 25.1*   < > 23.3* 30.6* 27.2* 24.6* 24.9*  MCV 94.0  --  91.7  --  90.1 91.4 92.9  PLT 275  --  208  --  220 229 260   < > = values in this interval not displayed.   Cardiac Enzymes: No results for input(s): "CKTOTAL", "CKMB", "CKMBINDEX", "TROPONINI" in the last 168 hours. BNP: Invalid input(s): "POCBNP" CBG: No results for input(s): "GLUCAP" in the last 168 hours. D-Dimer No results for input(s): "DDIMER" in the last 72 hours. Hgb A1c No results for input(s): "HGBA1C" in the last 72 hours. Lipid Profile No results for input(s): "CHOL", "HDL", "LDLCALC", "TRIG", "CHOLHDL", "LDLDIRECT" in the last 72 hours. Thyroid function studies No results for input(s): "TSH", "T4TOTAL", "T3FREE", "THYROIDAB" in the last 72 hours.  Invalid input(s): "FREET3" Anemia work up No results for input(s): "VITAMINB12", "FOLATE", "FERRITIN", "TIBC", "  IRON", "RETICCTPCT" in the last 72 hours. Urinalysis    Component Value Date/Time   COLORURINE YELLOW (A) 07/29/2022 1138    APPEARANCEUR CLEAR (A) 07/29/2022 1138   LABSPEC 1.014 07/29/2022 1138   PHURINE 5.0 07/29/2022 1138   GLUCOSEU NEGATIVE 07/29/2022 1138   HGBUR SMALL (A) 07/29/2022 1138   BILIRUBINUR NEGATIVE 07/29/2022 1138   KETONESUR NEGATIVE 07/29/2022 1138   PROTEINUR NEGATIVE 07/29/2022 1138   UROBILINOGEN 0.2 07/25/2014 1009   NITRITE NEGATIVE 07/29/2022 1138   LEUKOCYTESUR TRACE (A) 07/29/2022 1138   Sepsis Labs Recent Labs  Lab 07/29/22 0514 07/30/22 0400 07/31/22 0323 08/01/22 0406  WBC 10.9* 10.2 11.0* 10.4   Microbiology No results found for this or any previous visit (from the past 240 hour(s)).   Time coordinating discharge:  I have spent 35 minutes face to face with the patient and on the ward discussing the patients care, assessment, plan and disposition with other care givers. >50% of the time was devoted counseling the patient about the risks and benefits of treatment/Discharge disposition and coordinating care.   SIGNED:   Dimple NanasAnkit Chirag Imanuel Pruiett, MD  [facility] 08/01/2022, 11:58 AM   If 7PM-7AM, please contact night-coverage

## 2022-08-01 NOTE — Care Management Important Message (Signed)
Important Message  Patient Details  Name: Debbie Foster MRN: [id number] Date of Birth: 02/04/1925   Medicare Important Message Given:  N/A - LOS <3 / Initial given by admissions     Olegario Messier A Kenney Going 08/01/2022, 9:56 AM

## 2023-04-04 ENCOUNTER — Emergency Department: Payer: Medicare Other

## 2023-04-04 ENCOUNTER — Inpatient Hospital Stay
Admission: EM | Admit: 2023-04-04 | Discharge: 2023-04-06 | DRG: 193 | Disposition: A | Discharge status: Home / Self Care | Payer: Medicare Other | Attending: Internal Medicine | Admitting: Internal Medicine

## 2023-04-04 ENCOUNTER — Other Ambulatory Visit: Payer: Self-pay

## 2023-04-04 ENCOUNTER — Encounter: Payer: Self-pay | Admitting: Internal Medicine

## 2023-04-04 ENCOUNTER — Telehealth: Payer: Self-pay

## 2023-04-04 DIAGNOSIS — Z79899 Other long term (current) drug therapy: Secondary | ICD-10-CM

## 2023-04-04 DIAGNOSIS — E871 Hypo-osmolality and hyponatremia: Secondary | ICD-10-CM | POA: Insufficient documentation

## 2023-04-04 DIAGNOSIS — Z1152 Encounter for screening for COVID-19: Secondary | ICD-10-CM

## 2023-04-04 DIAGNOSIS — Z96642 Presence of left artificial hip joint: Secondary | ICD-10-CM | POA: Diagnosis present

## 2023-04-04 DIAGNOSIS — Z66 Do not resuscitate: Secondary | ICD-10-CM | POA: Diagnosis present

## 2023-04-04 DIAGNOSIS — Z96611 Presence of right artificial shoulder joint: Secondary | ICD-10-CM | POA: Diagnosis present

## 2023-04-04 DIAGNOSIS — Z961 Presence of intraocular lens: Secondary | ICD-10-CM | POA: Diagnosis present

## 2023-04-04 DIAGNOSIS — E43 Unspecified severe protein-calorie malnutrition: Secondary | ICD-10-CM | POA: Diagnosis present

## 2023-04-04 DIAGNOSIS — J188 Other pneumonia, unspecified organism: Secondary | ICD-10-CM

## 2023-04-04 DIAGNOSIS — Z96651 Presence of right artificial knee joint: Secondary | ICD-10-CM | POA: Diagnosis present

## 2023-04-04 DIAGNOSIS — I5032 Chronic diastolic (congestive) heart failure: Secondary | ICD-10-CM | POA: Diagnosis present

## 2023-04-04 DIAGNOSIS — I130 Hypertensive heart and chronic kidney disease with heart failure and stage 1 through stage 4 chronic kidney disease, or unspecified chronic kidney disease: Secondary | ICD-10-CM | POA: Diagnosis present

## 2023-04-04 DIAGNOSIS — I639 Cerebral infarction, unspecified: Secondary | ICD-10-CM | POA: Diagnosis present

## 2023-04-04 DIAGNOSIS — D75839 Thrombocytosis, unspecified: Secondary | ICD-10-CM | POA: Diagnosis present

## 2023-04-04 DIAGNOSIS — Z7982 Long term (current) use of aspirin: Secondary | ICD-10-CM | POA: Diagnosis not present

## 2023-04-04 DIAGNOSIS — K219 Gastro-esophageal reflux disease without esophagitis: Secondary | ICD-10-CM | POA: Diagnosis present

## 2023-04-04 DIAGNOSIS — Z9841 Cataract extraction status, right eye: Secondary | ICD-10-CM | POA: Diagnosis not present

## 2023-04-04 DIAGNOSIS — N1831 Chronic kidney disease, stage 3a: Secondary | ICD-10-CM | POA: Diagnosis present

## 2023-04-04 DIAGNOSIS — E7800 Pure hypercholesterolemia, unspecified: Secondary | ICD-10-CM | POA: Diagnosis present

## 2023-04-04 DIAGNOSIS — D649 Anemia, unspecified: Secondary | ICD-10-CM | POA: Diagnosis present

## 2023-04-04 DIAGNOSIS — Z87891 Personal history of nicotine dependence: Secondary | ICD-10-CM | POA: Diagnosis not present

## 2023-04-04 DIAGNOSIS — J189 Pneumonia, unspecified organism: Secondary | ICD-10-CM | POA: Diagnosis not present

## 2023-04-04 DIAGNOSIS — I10 Essential (primary) hypertension: Secondary | ICD-10-CM | POA: Diagnosis not present

## 2023-04-04 DIAGNOSIS — Z9842 Cataract extraction status, left eye: Secondary | ICD-10-CM

## 2023-04-04 DIAGNOSIS — Z8249 Family history of ischemic heart disease and other diseases of the circulatory system: Secondary | ICD-10-CM

## 2023-04-04 DIAGNOSIS — Z681 Body mass index (BMI) 19 or less, adult: Secondary | ICD-10-CM | POA: Diagnosis not present

## 2023-04-04 DIAGNOSIS — Z8673 Personal history of transient ischemic attack (TIA), and cerebral infarction without residual deficits: Secondary | ICD-10-CM | POA: Diagnosis not present

## 2023-04-04 DIAGNOSIS — E785 Hyperlipidemia, unspecified: Secondary | ICD-10-CM | POA: Diagnosis not present

## 2023-04-04 LAB — BASIC METABOLIC PANEL
Anion gap: 11 (ref 5–15)
BUN: 19 mg/dL (ref 8–23)
CO2: 25 mmol/L (ref 22–32)
Calcium: 9.8 mg/dL (ref 8.9–10.3)
Chloride: 94 mmol/L — ABNORMAL LOW (ref 98–111)
Creatinine, Ser: 0.97 mg/dL (ref 0.44–1.00)
GFR, Estimated: 53 mL/min — ABNORMAL LOW (ref >60)
Glucose, Bld: 109 mg/dL — ABNORMAL HIGH (ref 70–99)
Potassium: 4 mmol/L (ref 3.5–5.1)
Sodium: 130 mmol/L — ABNORMAL LOW (ref 135–145)

## 2023-04-04 LAB — CBC
HCT: 36.3 % (ref 36.0–46.0)
Hemoglobin: 11.5 g/dL — ABNORMAL LOW (ref 12.0–15.0)
MCH: 26.9 pg (ref 26.0–34.0)
MCHC: 31.7 g/dL (ref 30.0–36.0)
MCV: 84.8 fL (ref 80.0–100.0)
Platelets: 476 10*3/uL — ABNORMAL HIGH (ref 150–400)
RBC: 4.28 MIL/uL (ref 3.87–5.11)
RDW: 15.7 % — ABNORMAL HIGH (ref 11.5–15.5)
WBC: 18.8 10*3/uL — ABNORMAL HIGH (ref 4.0–10.5)
nRBC: 0 % (ref 0.0–0.2)

## 2023-04-04 LAB — SARS CORONAVIRUS 2 BY RT PCR: SARS Coronavirus 2 by RT PCR: NEGATIVE

## 2023-04-04 LAB — BRAIN NATRIURETIC PEPTIDE: B Natriuretic Peptide: 129.4 pg/mL — ABNORMAL HIGH (ref 0.0–100.0)

## 2023-04-04 LAB — PROCALCITONIN: Procalcitonin: <0.1 ng/mL

## 2023-04-04 MED ORDER — TIMOLOL MALEATE 0.5 % OP SOLN
1.0000 [drp] | Freq: Two times a day (BID) | OPHTHALMIC | Status: DC
  Administered 2023-04-04 – 2023-04-06 (×4): 1 [drp] via OPHTHALMIC
  Filled 2023-04-04: qty 5

## 2023-04-04 MED ORDER — ONDANSETRON HCL 4 MG/2ML IJ SOLN: 4.0000 mg | Freq: Three times a day (TID) | INTRAMUSCULAR | Status: DC | PRN

## 2023-04-04 MED ORDER — COENZYME Q10 100 MG PO CAPS: 100.0000 mg | ORAL_CAPSULE | Freq: Every day | ORAL | Status: DC

## 2023-04-04 MED ORDER — BRIMONIDINE TARTRATE-TIMOLOL 0.2-0.5 % OP SOLN
1.0000 [drp] | Freq: Two times a day (BID) | OPHTHALMIC | Status: DC
  Filled 2023-04-04 (×2): qty 5

## 2023-04-04 MED ORDER — ENOXAPARIN SODIUM 30 MG/0.3ML IJ SOSY
30.0000 mg | PREFILLED_SYRINGE | INTRAMUSCULAR | Status: DC
  Administered 2023-04-04 – 2023-04-05 (×2): 30 mg via SUBCUTANEOUS
  Filled 2023-04-04 (×2): qty 0.3

## 2023-04-04 MED ORDER — BRIMONIDINE TARTRATE 0.2 % OP SOLN
1.0000 [drp] | Freq: Two times a day (BID) | OPHTHALMIC | Status: DC
  Administered 2023-04-04 – 2023-04-06 (×4): 1 [drp] via OPHTHALMIC
  Filled 2023-04-04: qty 5

## 2023-04-04 MED ORDER — OLMESARTAN-AMLODIPINE-HCTZ 40-10-12.5 MG PO TABS: 1.0000 | ORAL_TABLET | Freq: Every day | ORAL | Status: DC

## 2023-04-04 MED ORDER — SODIUM CHLORIDE 0.9 % IV SOLN
1.0000 g | Freq: Once | INTRAVENOUS | Status: AC
  Administered 2023-04-04: 1 g via INTRAVENOUS
  Filled 2023-04-04: qty 10

## 2023-04-04 MED ORDER — ALBUTEROL SULFATE (2.5 MG/3ML) 0.083% IN NEBU: 2.5000 mg | INHALATION_SOLUTION | RESPIRATORY_TRACT | Status: DC | PRN

## 2023-04-04 MED ORDER — NITROGLYCERIN 0.4 MG SL SUBL: 0.4000 mg | SUBLINGUAL_TABLET | SUBLINGUAL | Status: DC | PRN

## 2023-04-04 MED ORDER — SODIUM CHLORIDE 0.9 % IV SOLN
1.0000 g | INTRAVENOUS | Status: DC
  Administered 2023-04-05: 1 g via INTRAVENOUS
  Filled 2023-04-04: qty 10
  Filled 2023-04-04: qty 1

## 2023-04-04 MED ORDER — AMLODIPINE BESYLATE 10 MG PO TABS
10.0000 mg | ORAL_TABLET | Freq: Every day | ORAL | Status: DC
  Administered 2023-04-05 – 2023-04-06 (×2): 10 mg via ORAL
  Filled 2023-04-04 (×2): qty 1

## 2023-04-04 MED ORDER — IRBESARTAN 150 MG PO TABS
300.0000 mg | ORAL_TABLET | Freq: Every day | ORAL | Status: DC
  Administered 2023-04-05 – 2023-04-06 (×2): 300 mg via ORAL
  Filled 2023-04-04 (×2): qty 2

## 2023-04-04 MED ORDER — METOPROLOL SUCCINATE ER 25 MG PO TB24
25.0000 mg | ORAL_TABLET | Freq: Every day | ORAL | Status: DC
  Administered 2023-04-05 – 2023-04-06 (×2): 25 mg via ORAL
  Filled 2023-04-04 (×2): qty 1

## 2023-04-04 MED ORDER — ACETAMINOPHEN 325 MG PO TABS: 650.0000 mg | ORAL_TABLET | Freq: Four times a day (QID) | ORAL | Status: DC | PRN

## 2023-04-04 MED ORDER — VITAMIN D 25 MCG (1000 UNIT) PO TABS
1000.0000 [IU] | ORAL_TABLET | Freq: Every day | ORAL | Status: DC
  Administered 2023-04-05 – 2023-04-06 (×2): 1000 [IU] via ORAL
  Filled 2023-04-04 (×2): qty 1

## 2023-04-04 MED ORDER — PREDNISOLONE ACETATE 1 % OP SUSP
1.0000 [drp] | Freq: Every morning | OPHTHALMIC | Status: DC
  Administered 2023-04-05 – 2023-04-06 (×2): 1 [drp] via OPHTHALMIC
  Filled 2023-04-04: qty 1

## 2023-04-04 MED ORDER — HYDRALAZINE HCL 20 MG/ML IJ SOLN: 5.0000 mg | INTRAMUSCULAR | Status: DC | PRN

## 2023-04-04 MED ORDER — ENSURE ENLIVE PO LIQD
237.0000 mL | Freq: Two times a day (BID) | ORAL | Status: DC
  Administered 2023-04-05 – 2023-04-06 (×3): 237 mL via ORAL

## 2023-04-04 MED ORDER — SODIUM CHLORIDE 0.9 % IV SOLN
500.0000 mg | Freq: Once | INTRAVENOUS | Status: AC
  Administered 2023-04-04: 500 mg via INTRAVENOUS
  Filled 2023-04-04: qty 5

## 2023-04-04 MED ORDER — SODIUM CHLORIDE 0.9 % IV SOLN
500.0000 mg | INTRAVENOUS | Status: DC
  Administered 2023-04-05: 500 mg via INTRAVENOUS
  Filled 2023-04-04: qty 500
  Filled 2023-04-04 (×2): qty 5

## 2023-04-04 MED ORDER — ASPIRIN 81 MG PO TBEC
81.0000 mg | DELAYED_RELEASE_TABLET | Freq: Every day | ORAL | Status: DC
  Administered 2023-04-05 – 2023-04-06 (×2): 81 mg via ORAL
  Filled 2023-04-04 (×2): qty 1

## 2023-04-04 MED ORDER — DM-GUAIFENESIN ER 30-600 MG PO TB12: 1.0000 | ORAL_TABLET | Freq: Two times a day (BID) | ORAL | Status: DC | PRN

## 2023-04-04 MED ORDER — HYDROCHLOROTHIAZIDE 12.5 MG PO TABS
12.5000 mg | ORAL_TABLET | Freq: Every day | ORAL | Status: DC
  Administered 2023-04-05 – 2023-04-06 (×2): 12.5 mg via ORAL
  Filled 2023-04-04 (×2): qty 1

## 2023-04-04 NOTE — Telephone Encounter (Signed)
Pt daughter  called that call 911 last night and they don't take her to the hospital told her call for appt advised her that she can call her PCP and we can she her Monday by DR Welton Flakes

## 2023-04-04 NOTE — ED Notes (Signed)
Pt states L ankle swells more than R; currently swelling noted only in ankles; pt denies hx of CHF and being on fluid pills; pt reports doesn't feel more SOB than normal while at rest but gets abnormally SOB upon activity/walking. Pt's daughter states pt was on Plavix for years but unsure why; state unsure if pt ever dx with A-fib; state hx of "heart murmur, weak heart muscles and fibrous material in lungs".

## 2023-04-04 NOTE — ED Triage Notes (Addendum)
Pt comes with c/o increased sob. Pt did also have some lab work done at PCP and was informed  the results were abnormal.    Pt states sob. Pt states cough and thick sputum.  Pt states some pain in back. Pt also has some swelling in both foot. Family reports this happens occasionally.

## 2023-04-04 NOTE — ED Notes (Signed)
Charge RN Erskine Squibb currently calling supply chain/portable to get more channels as this RN could find pump but no channels currently available in ER. Will hang 2nd antibiotic once channel available.

## 2023-04-04 NOTE — H&P (Signed)
History and Physical    Debbie Foster NWG:[id number] DOB: 06-Dec-1924 DOA: 04/04/2023  Referring MD/NP/PA:   PCP: Alan Mulder, MD   Patient coming from:  The patient is coming from home.     Chief Complaint: Cough, shortness of breath  HPI: Debbie Foster is a 89 y.o. female with medical history significant of hypertension, hyperlipidemia, diastolic CHF, stroke, GERD, GI bleeding, CKD-3A, anemia, diverticulitis, migraine, who presents with cough and shortness of breath.  Patient states that she has SOB and cough for almost a week, she coughs up thick mucus, denies chest pain, fever or chills.  She also reports bilateral ankle edema.  No nausea vomiting, diarrhea or abdominal pain.  No symptoms of UTI.  Data reviewed independently and ED Course: pt was found to have WBC 18.8, BNP 129, negative COVID PCR, stable renal function, temperature normal, blood pressure 139/59, heart rate 87, RR 20, oxygen saturation 97% on room air.  Chest x-ray showed diffused bilateral reticular density.  Patient is admitted to telemetry bed as inpatient.   EKG: I have personally reviewed.  Sinus rhythm, QTc 414, LAD, poor R wave progression.   Review of Systems:   General: no fevers, chills, no body weight gain, has fatigue HEENT: no blurry vision, hearing changes or sore throat Respiratory: has dyspnea, coughing, no wheezing CV: no chest pain, no palpitations GI: no nausea, vomiting, abdominal pain, diarrhea, constipation GU: no dysuria, burning on urination, increased urinary frequency, hematuria  Ext: Has ankle edema Neuro: no unilateral weakness, numbness, or tingling, no vision change or hearing loss Skin: no rash, no skin tear. MSK: No muscle spasm, no deformity, no limitation of range of movement in spin Heme: No easy bruising.  Travel history: No recent long distant travel.   Allergy: No Known Allergies  Past Medical History:  Diagnosis Date   Arthritis    "just about q bone in my  body"   Blood dyscrasia    Bronchitis 11/2013   "first time I've ever had it; still coughing from it now" (08/05/2014)   Diverticulitis 2004   Double vision 2012   "left eye; from the eye stroke; can see fine w/my glasses"   GERD (gastroesophageal reflux disease)    h/o uses OTC   Heart murmur    High cholesterol    Hypertension    Migraine    "stopped w/the change of life; age 77"   Squamous carcinoma    arms and face   Stroke (HCC)    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    Past Surgical History:  Procedure Laterality Date   CARPAL TUNNEL RELEASE Right    CATARACT EXTRACTION W/ INTRAOCULAR LENS IMPLANT Right 2003   CATARACT EXTRACTION W/ INTRAOCULAR LENS IMPLANT Left 2006   JOINT REPLACEMENT     REVERSE SHOULDER ARTHROPLASTY Right 08/05/2014   REVERSE SHOULDER ARTHROPLASTY Right 08/05/2014   Procedure: REVERSE SHOULDER ARTHROPLASTY;  Surgeon: Mable Paris, MD;  Location: [facility] OR;  Service: Orthopedics;  Laterality: Right;  Right reverse total shoulder arthroplasty   TONSILLECTOMY     TOTAL HIP ARTHROPLASTY Left 1998   TOTAL KNEE ARTHROPLASTY Right 2005    Social History:  reports that she has quit smoking. Her smoking use included cigarettes. She has never used smokeless tobacco. She reports that she does not drink alcohol and does not use drugs.  Family History:  Family History  Problem Relation Age of Onset   Heart attack Mother    Heart attack  Brother      Prior to Admission medications   Medication Sig Start Date End Date Taking? Authorizing Provider  aspirin 81 MG tablet Take 81 mg by mouth daily.    [provider]  bimatoprost (LUMIGAN) 0.03 % ophthalmic solution Place 1 drop into the right eye at bedtime.    [provider]  brimonidine-timolol (COMBIGAN) 0.2-0.5 % ophthalmic solution Place 1 drop into both eyes every 12 (twelve) hours.    [provider]  Cholecalciferol (VITAMIN D3) 1000 UNITS CAPS Take 1,000 Units by mouth daily.     [provider]  Coenzyme Q10 100 MG capsule Take 100 mg by mouth daily.    [provider]  Lysine 500 MG TABS Take 500 mg by mouth daily.    [provider]  metoprolol succinate (TOPROL-XL) 50 MG 24 hr tablet Take 25 mg by mouth daily. Take half a tablet by mouth daily    [provider]  Multiple Vitamins-Minerals (MULTIVITAMIN WITH MINERALS) tablet Take 1 tablet by mouth daily.    [provider]  Multiple Vitamins-Minerals (PRESERVISION AREDS 2+MULTI VIT) CAPS Take 1 capsule by mouth daily.    [provider]  nitroGLYCERIN (NITROSTAT) 0.4 MG SL tablet Place 0.4 mg under the tongue every 5 (five) minutes as needed.  03/02/16   [provider]  Olmesartan-amLODIPine-HCTZ 40-10-12.5 MG TABS Take 1 tablet by mouth once daily. daily 04/19/21   [provider]  pantoprazole (PROTONIX) 40 MG tablet Take 1 tablet (40 mg total) by mouth 2 (two) times daily before a meal. 08/01/22 09/30/22  Amin, Loura Halt, MD  prednisoLONE acetate (PRED MILD) 0.12 % ophthalmic suspension Place 1 drop into the right eye daily.    [provider]  simvastatin (ZOCOR) 40 MG tablet Take 40 mg by mouth at bedtime.    [provider]  trolamine salicylate (ASPERCREME) 10 % cream Apply 1 application topically as needed for muscle pain.    [provider]    Physical Exam: Vitals:   04/04/23 1415 04/04/23 1421 04/04/23 1500 04/04/23 1617  BP:      Pulse:      Resp: 20 12    Temp:      SpO2:      Weight:    39.2 kg  Height:   5\' 1"  (1.549 m)    General: Not in acute distress HEENT:       Eyes: PERRL, EOMI, no scleral icterus.       ENT: No discharge from the ears and nose, no pharynx injection, no tonsillar enlargement.        Neck: No JVD, no bruit, no mass felt. Heme: No neck lymph node enlargement. Cardiac: S1/S2, RRR, No murmurs, No gallops or rubs. Respiratory: No rales, wheezing, rhonchi or rubs. GI: Soft,  nondistended, nontender, no rebound pain, no organomegaly, BS present. GU: No hematuria Ext:  1+DP/PT pulse bilaterally.  Has chronic venous insufficiency change in both legs, has mild bilateral ankle edema Musculoskeletal: No joint deformities, No joint redness or warmth, no limitation of ROM in spin. Skin: No rashes.  Neuro: Alert, oriented X3, cranial nerves II-XII grossly intact, moves all extremities normally.  Psych: Patient is not psychotic, no suicidal or hemocidal ideation.  Labs on Admission: I have personally reviewed following labs and imaging studies  CBC: Recent Labs  Lab 04/04/23 1056  WBC 18.8*  HGB 11.5*  HCT 36.3  MCV 84.8  PLT 476*   Basic Metabolic Panel: Recent  Labs  Lab 04/04/23 1056  NA 130*  K 4.0  CL 94*  CO2 25  GLUCOSE 109*  BUN 19  CREATININE 0.97  CALCIUM 9.8   GFR: Estimated Creatinine Clearance: 21 mL/min (by C-G formula based on SCr of 0.97 mg/dL). Liver Function Tests: No results for input(s): "AST", "ALT", "ALKPHOS", "BILITOT", "PROT", "ALBUMIN" in the last 168 hours. No results for input(s): "LIPASE", "AMYLASE" in the last 168 hours. No results for input(s): "AMMONIA" in the last 168 hours. Coagulation Profile: No results for input(s): "INR", "PROTIME" in the last 168 hours. Cardiac Enzymes: No results for input(s): "CKTOTAL", "CKMB", "CKMBINDEX", "TROPONINI" in the last 168 hours. BNP (last 3 results) No results for input(s): "PROBNP" in the last 8760 hours. HbA1C: No results for input(s): "HGBA1C" in the last 72 hours. CBG: No results for input(s): "GLUCAP" in the last 168 hours. Lipid Profile: No results for input(s): "CHOL", "HDL", "LDLCALC", "TRIG", "CHOLHDL", "LDLDIRECT" in the last 72 hours. Thyroid Function Tests: No results for input(s): "TSH", "T4TOTAL", "FREET4", "T3FREE", "THYROIDAB" in the last 72 hours. Anemia Panel: No results for input(s): "VITAMINB12", "FOLATE", "FERRITIN", "TIBC", "IRON", "RETICCTPCT" in the  last 72 hours. Urine analysis:    Component Value Date/Time   COLORURINE YELLOW (A) 07/29/2022 1138   APPEARANCEUR CLEAR (A) 07/29/2022 1138   LABSPEC 1.014 07/29/2022 1138   PHURINE 5.0 07/29/2022 1138   GLUCOSEU NEGATIVE 07/29/2022 1138   HGBUR SMALL (A) 07/29/2022 1138   BILIRUBINUR NEGATIVE 07/29/2022 1138   KETONESUR NEGATIVE 07/29/2022 1138   PROTEINUR NEGATIVE 07/29/2022 1138   UROBILINOGEN 0.2 07/25/2014 1009   NITRITE NEGATIVE 07/29/2022 1138   LEUKOCYTESUR TRACE (A) 07/29/2022 1138   Sepsis Labs: @LABRCNTIP (procalcitonin:4,lacticidven:4) ) Recent Results (from the past 240 hour(s))  SARS Coronavirus 2 by RT PCR (hospital order, performed in [facility] hospital lab) *cepheid single result test* Anterior Nasal Swab     Status: None   Collection Time: 04/04/23  1:16 PM   Specimen: Anterior Nasal Swab  Result Value Ref Range Status   SARS Coronavirus 2 by RT PCR NEGATIVE NEGATIVE Final    Comment: (NOTE) SARS-CoV-2 target nucleic acids are NOT DETECTED.  The SARS-CoV-2 RNA is generally detectable in upper and lower respiratory specimens during the acute phase of infection. The lowest concentration of SARS-CoV-2 viral copies this assay can detect is 250 copies / mL. A negative result does not preclude SARS-CoV-2 infection and should not be used as the sole basis for treatment or other patient management decisions.  A negative result may occur with improper specimen collection / handling, submission of specimen other than nasopharyngeal swab, presence of viral mutation(s) within the areas targeted by this assay, and inadequate number of viral copies (<250 copies / mL). A negative result must be combined with clinical observations, patient history, and epidemiological information.  Fact Sheet for Patients:   RoadLapTop.co.za  Fact Sheet for Healthcare Providers: http://kim-miller.com/  This test is not yet approved or   cleared by the [place] FDA and has been authorized for detection and/or diagnosis of SARS-CoV-2 by FDA under an Emergency Use Authorization (EUA).  This EUA will remain in effect (meaning this test can be used) for the duration of the COVID-19 declaration under Section 564(b)(1) of the Act, 21 U.S.C. section 360bbb-3(b)(1), unless the authorization is terminated or revoked sooner.  Performed at [facility], [address] [place], [place] [address]      Radiological Exams on Admission: DG Chest 2 View  Result Date: 04/04/2023 CLINICAL  DATA:  Shortness of breath. EXAM: CHEST - 2 VIEW COMPARISON:  July 25, 2014. FINDINGS: Stable cardiomediastinal silhouette. Status post right shoulder arthroplasty. Increased reticular densities are noted throughout both lungs, particularly in the lung bases. This may simply represent scarring or fibrosis, but acute superimposed atypical inflammation cannot be excluded. IMPRESSION: Increased diffuse reticular densities are noted bilaterally as described above. Electronically Signed   By: Lupita Raider M.D.   On: 04/04/2023 12:04      Assessment/Plan Principal Problem:   CAP (community acquired pneumonia) Active Problems:   Chronic diastolic CHF (congestive heart failure) (HCC)   HTN (hypertension)   HLD (hyperlipidemia)   Stroke (HCC)   Chronic kidney disease, stage 3a (HCC)   Protein-calorie malnutrition, severe (HCC)   Assessment and Plan:  CAP (community acquired pneumonia): Patient is not stable.  No oxygen desaturation.  She has a WBC 18.8, heart rate 87, RR 20, temperature normal.  - Will admit to tele bed as inpt - IV Rocephin and azithromycin - Mucinex for cough  - Bronchodilators - Urine legionella and S. pneumococcal antigen - Follow up blood culture x2, sputum culture  Chronic diastolic CHF (congestive heart failure) (HCC): 2D echo on 06/2022 showed EF 60 to 65% with grade 1 diastolic dysfunction.  Patient has  mild ankle edema, BNP 129, no JVD, does not seem to have CHF exacerbation. -Will not give IV Lasix. -Watch volume status closely  HTN (hypertension) -Continue home olmesartan-amlodipine-HCTZ -IV hydralazine as needed  HLD (hyperlipidemia) -Patient is not taking Zocor currently -Follow-up with PCP  Stroke (HCC) -Aspirin  Chronic kidney disease, stage 3a (HCC): Renal function stable -Follow-up with BMP  Protein-calorie malnutrition, severe (HCC): Body weight is 39.2 kg, BMI 16.34 -Ensure -Nutrition consult     DVT ppx: SQ Lovenox  Code Status: DNR per pt and her daughter  Family Communication:   Yes, patient's daughter   at bed side.      Disposition Plan:  Anticipate discharge back to previous environment  Consults called: None  Admission status and Level of care: Telemetry Medical:     as inpt      Dispo: The patient is from: Home              Anticipated d/c is to: Home              Anticipated d/c date is: 2 days              Patient currently is not medically stable to d/c.    Severity of Illness:  The appropriate patient status for this patient is INPATIENT. Inpatient status is judged to be reasonable and necessary in order to provide the required intensity of service to ensure the patient's safety. The patient's presenting symptoms, physical exam findings, and initial radiographic and laboratory data in the context of their chronic comorbidities is felt to place them at high risk for further clinical deterioration. Furthermore, it is not anticipated that the patient will be medically stable for discharge from the hospital within 2 midnights of admission.   * I certify that at the point of admission it is my clinical judgment that the patient will require inpatient hospital care spanning beyond 2 midnights from the point of admission due to high intensity of service, high risk for further deterioration and high frequency of surveillance  required.*       Date of Service 04/04/2023    Lorretta Harp [facility]   If 7PM-7AM, please contact night-coverage [email or web address]  04/04/2023, 5:44 PM

## 2023-04-04 NOTE — ED Notes (Signed)
No IV pump channels yet received in ER.

## 2023-04-04 NOTE — ED Notes (Signed)
RN messaged regarding pt coming to room

## 2023-04-04 NOTE — ED Notes (Signed)
Pt tolerating antibiotic well. Will hang 2nd antibiotic once rocephin finished since micromedex does not confirm compatibility officially.

## 2023-04-04 NOTE — ED Notes (Signed)
HOB adjusted for pt; pt's daughter remains at bedside; pt received drink and lunch tray from dietary staff which she is about to start eating from.

## 2023-04-04 NOTE — ED Notes (Signed)
Lab called to draw blood cultures

## 2023-04-04 NOTE — ED Notes (Signed)
Pt ambulatory to bathroom with walker.

## 2023-04-04 NOTE — ED Provider Notes (Signed)
[facility] Provider Note    Event Date/Time   First MD Initiated Contact with Patient 04/04/23 1152     (approximate)   History   Chief Complaint Shortness of Breath   HPI  Debbie Foster is a 89 y.o. female with past medical history of hypertension, migraines, and stroke who presents to the ED complaining of shortness of breath.  Per daughter at bedside, patient has been increasingly short of breath for about the past week.  She has begun to have a productive cough over the past 24 hours with increasing weakness and malaise.  She has not had any fevers and denies any chest pain, nausea, vomiting, dysuria, or abdominal pain.  Family is not aware of any sick contacts.      Physical Exam   Triage Vital Signs: ED Triage Vitals  Enc Vitals Group     BP 04/04/23 1057 (!) 147/69     Pulse Rate 04/04/23 1057 87     Resp 04/04/23 1057 20     Temp 04/04/23 1057 98 F (36.7 C)     Temp src --      SpO2 04/04/23 1057 97 %     Weight --      Height --      Head Circumference --      Peak Flow --      Pain Score 04/04/23 1055 6     Pain Loc --      Pain Edu? --      Excl. in GC? --     Most recent vital signs: Vitals:   04/04/23 1415 04/04/23 1421  BP:    Pulse:    Resp: 20 12  Temp:    SpO2:      Constitutional: Alert and oriented. Eyes: Conjunctivae are normal. Head: Atraumatic. Nose: No congestion/rhinnorhea. Mouth/Throat: Mucous membranes are moist.  Cardiovascular: Normal rate, regular rhythm. Grossly normal heart sounds.  2+ radial pulses bilaterally. Respiratory: Normal respiratory effort.  No retractions. Lungs with diffuse crackles. Gastrointestinal: Soft and nontender. No distention. Musculoskeletal: No lower extremity tenderness nor edema.  Neurologic:  Normal speech and language. No gross focal neurologic deficits are appreciated.    ED Results / Procedures / Treatments   Labs (all labs ordered are listed, but only abnormal  results are displayed) Labs Reviewed  BASIC METABOLIC PANEL - Abnormal; Notable for the following components:      Result Value   Sodium 130 (*)    Chloride 94 (*)    Glucose, Bld 109 (*)    GFR, Estimated 53 (*)    All other components within normal limits  CBC - Abnormal; Notable for the following components:   WBC 18.8 (*)    Hemoglobin 11.5 (*)    RDW 15.7 (*)    Platelets 476 (*)    All other components within normal limits  BRAIN NATRIURETIC PEPTIDE - Abnormal; Notable for the following components:   B Natriuretic Peptide 129.4 (*)    All other components within normal limits  SARS CORONAVIRUS 2 BY RT PCR  CULTURE, BLOOD (ROUTINE X 2)  CULTURE, BLOOD (ROUTINE X 2)  EXPECTORATED SPUTUM ASSESSMENT W GRAM STAIN, RFLX TO RESP C  PROCALCITONIN  LEGIONELLA PNEUMOPHILA SEROGP 1 UR AG  STREP PNEUMONIAE URINARY ANTIGEN     EKG  ED ECG REPORT I, Chesley Noon, the attending physician, personally viewed and interpreted this ECG.   Date: 04/04/2023  EKG Time: 11:06  Rate: 86  Rhythm:  normal sinus rhythm  Axis: Normal  Intervals:none  ST&T Change: None  RADIOLOGY Chest x-ray reviewed and interpreted by me with interstitial infiltrates, no pulmonary edema or effusion noted.  PROCEDURES:  Critical Care performed: No  Procedures   MEDICATIONS ORDERED IN ED: Medications  azithromycin (ZITHROMAX) 500 mg in sodium chloride 0.9 % 250 mL IVPB (500 mg Intravenous New Bag/Given 04/04/23 1509)  albuterol (PROVENTIL) (2.5 MG/3ML) 0.083% nebulizer solution 2.5 mg (has no administration in time range)  dextromethorphan-guaiFENesin (MUCINEX DM) 30-600 MG per 12 hr tablet 1 tablet (has no administration in time range)  ondansetron (ZOFRAN) injection 4 mg (has no administration in time range)  hydrALAZINE (APRESOLINE) injection 5 mg (has no administration in time range)  acetaminophen (TYLENOL) tablet 650 mg (has no administration in time range)  enoxaparin (LOVENOX) injection 40 mg  (has no administration in time range)  azithromycin (ZITHROMAX) 500 mg in sodium chloride 0.9 % 250 mL IVPB (has no administration in time range)  cefTRIAXone (ROCEPHIN) 1 g in sodium chloride 0.9 % 100 mL IVPB (has no administration in time range)  aspirin EC tablet 81 mg (has no administration in time range)  metoprolol succinate (TOPROL-XL) 24 hr tablet 25 mg (has no administration in time range)  nitroGLYCERIN (NITROSTAT) SL tablet 0.4 mg (has no administration in time range)  cholecalciferol (VITAMIN D3) 25 MCG (1000 UNIT) tablet 1,000 Units (has no administration in time range)  brimonidine-timolol (COMBIGAN) 0.2-0.5 % ophthalmic solution 1 drop (has no administration in time range)  prednisoLONE acetate (PRED FORTE) 1 % ophthalmic suspension 1 drop (has no administration in time range)  irbesartan (AVAPRO) tablet 300 mg (has no administration in time range)  amLODipine (NORVASC) tablet 10 mg (has no administration in time range)  hydrochlorothiazide (HYDRODIURIL) tablet 12.5 mg (has no administration in time range)  cefTRIAXone (ROCEPHIN) 1 g in sodium chloride 0.9 % 100 mL IVPB (0 g Intravenous Stopped 04/04/23 1352)     IMPRESSION / MDM / ASSESSMENT AND PLAN / ED COURSE  I reviewed the triage vital signs and the nursing notes.                              89 y.o. female with past medical history of hypertension, migraines, and stroke who presents to the ED complaining of increasing difficulty breathing over the past week now with productive cough over the past 24 hours.  Patient's presentation is most consistent with acute presentation with potential threat to life or bodily function.  Differential diagnosis includes, but is not limited to, sepsis, pneumonia, ACS, PE, CHF, COPD, bronchitis, COVID-19, influenza.  Patient nontoxic-appearing and in no acute distress, vital signs are unremarkable.  She is not in any respiratory distress and maintaining oxygen saturations at 96% on room  air.  She does have diffuse crackles and chest x-ray shows interstitial infiltrate concerning for atypical pneumonia.  With her productive cough, I am concerned for developing pneumonia and we will treat with IV Rocephin and azithromycin.  Labs show leukocytosis but with reassuring vital signs, patient does not meet sepsis criteria at this time.  Additional labs are reassuring with no significant anemia, electrolyte abnormality, or AKI.  COVID and flu testing is negative.  Case discussed with hospitalist for admission.      FINAL CLINICAL IMPRESSION(S) / ED DIAGNOSES   Final diagnoses:  Pneumonia due to infectious organism, unspecified laterality, unspecified part of lung     Rx / DC  Orders   ED Discharge Orders     None        Note:  This document was prepared using Dragon voice recognition software and may include unintentional dictation errors.   Chesley Noon, MD 04/04/23 [phone]

## 2023-04-05 DIAGNOSIS — Z8673 Personal history of transient ischemic attack (TIA), and cerebral infarction without residual deficits: Secondary | ICD-10-CM | POA: Diagnosis not present

## 2023-04-05 DIAGNOSIS — D75839 Thrombocytosis, unspecified: Secondary | ICD-10-CM | POA: Insufficient documentation

## 2023-04-05 DIAGNOSIS — I5032 Chronic diastolic (congestive) heart failure: Secondary | ICD-10-CM | POA: Diagnosis not present

## 2023-04-05 DIAGNOSIS — E871 Hypo-osmolality and hyponatremia: Secondary | ICD-10-CM | POA: Insufficient documentation

## 2023-04-05 DIAGNOSIS — J189 Pneumonia, unspecified organism: Secondary | ICD-10-CM

## 2023-04-05 DIAGNOSIS — I10 Essential (primary) hypertension: Secondary | ICD-10-CM | POA: Diagnosis not present

## 2023-04-05 LAB — CBC
HCT: 32.3 % — ABNORMAL LOW (ref 36.0–46.0)
Hemoglobin: 10.2 g/dL — ABNORMAL LOW (ref 12.0–15.0)
MCH: 26.7 pg (ref 26.0–34.0)
MCHC: 31.6 g/dL (ref 30.0–36.0)
MCV: 84.6 fL (ref 80.0–100.0)
Platelets: 416 10*3/uL — ABNORMAL HIGH (ref 150–400)
RBC: 3.82 MIL/uL — ABNORMAL LOW (ref 3.87–5.11)
RDW: 15.5 % (ref 11.5–15.5)
WBC: 9.4 10*3/uL (ref 4.0–10.5)
nRBC: 0 % (ref 0.0–0.2)

## 2023-04-05 LAB — BASIC METABOLIC PANEL
Anion gap: 7 (ref 5–15)
BUN: 19 mg/dL (ref 8–23)
CO2: 28 mmol/L (ref 22–32)
Calcium: 9.6 mg/dL (ref 8.9–10.3)
Chloride: 97 mmol/L — ABNORMAL LOW (ref 98–111)
Creatinine, Ser: 0.98 mg/dL (ref 0.44–1.00)
GFR, Estimated: 53 mL/min — ABNORMAL LOW (ref >60)
Glucose, Bld: 92 mg/dL (ref 70–99)
Potassium: 3.7 mmol/L (ref 3.5–5.1)
Sodium: 132 mmol/L — ABNORMAL LOW (ref 135–145)

## 2023-04-05 LAB — CULTURE, BLOOD (ROUTINE X 2)

## 2023-04-05 MED ORDER — ADULT MULTIVITAMIN W/MINERALS CH
1.0000 | ORAL_TABLET | Freq: Every day | ORAL | Status: DC
  Administered 2023-04-05 – 2023-04-06 (×2): 1 via ORAL
  Filled 2023-04-05 (×2): qty 1

## 2023-04-05 NOTE — Assessment & Plan Note (Signed)
Bilateral pneumonia seen on x-ray.  Received 3 days of IV Rocephin and high-dose Zithromax.  Will switch to 2 more days of Omnicef orally upon going home (because of her kidney function and age this will be dosed once a day).

## 2023-04-05 NOTE — Assessment & Plan Note (Signed)
Sodium 132.  If continues to stay low can consider getting rid of hydrochlorothiazide.

## 2023-04-05 NOTE — Evaluation (Signed)
Physical Therapy Evaluation Patient Details Name: Debbie Foster MRN: [id number] DOB: Mar 13, 1925 Today's Date: 04/05/2023  History of Present Illness  Pt is a 89 y.o. female with medical history significant of hypertension, hyperlipidemia, diastolic CHF, stroke, GERD, GI bleeding, CKD-3A, anemia, diverticulitis, migraine, who presents with cough and shortness of breath. MD assessment includes: multifocal pneumonia, anemia, hyponatremia, thrombocytosis, and severe protein-calorie malnutrition.   Clinical Impression  Pt was pleasant and motivated to participate during the session and put forth good effort throughout. Pt found on room air with SpO2 96-97% at rest.  Pt taken through graded intensity ambulation with SpO2 and HR monitored throughout with SpO2 remaining in the upper 90s with short walks and decreasing to a low of 91% after amb 80 feet with HR WNL throughout.  Pt's SpO2 quickly returned to the mid to upper 90s upon returning to sitting after amb.  Both pt and daughter who was present during the session reported pt being close to baseline functionally with no need for continued PT services upon discharge.  Will keep pt on acute care PT caseload to continue to monitor and prevent functional decline.          Recommendations for follow up therapy are one component of a multi-disciplinary discharge planning process, led by the attending physician.  Recommendations may be updated based on patient status, additional functional criteria and insurance authorization.  Follow Up Recommendations       Assistance Recommended at Discharge Intermittent Supervision/Assistance  Patient can return home with the following  A little help with walking and/or transfers;A little help with bathing/dressing/bathroom;Assistance with cooking/housework;Direct supervision/assist for medications management;Help with stairs or ramp for entrance;Assist for transportation    Equipment Recommendations None recommended  by PT  Recommendations for Other Services       Functional Status Assessment Patient has had a recent decline in their functional status and demonstrates the ability to make significant improvements in function in a reasonable and predictable amount of time.     Precautions / Restrictions Precautions Precautions: Fall Restrictions Weight Bearing Restrictions: No      Mobility  Bed Mobility Overal bed mobility: Modified Independent             General bed mobility comments: Min extra time and effort only    Transfers Overall transfer level: Needs assistance Equipment used: Rolling walker (2 wheels) Transfers: Sit to/from Stand Sit to Stand: Min guard           General transfer comment: No physical assistance needed with mod verbal cuing for proper sequencing, most notably for hand placement and increased trunk flexion, but min A required without cuing    Ambulation/Gait   Gait Distance (Feet): 8 Feet x 1, 20 Feet x 2, 80 Feet x 1  Assistive device: Rolling walker (2 wheels) Gait Pattern/deviations: Step-through pattern, Decreased step length - right, Decreased step length - left, Trunk flexed Gait velocity: decreased     General Gait Details: Pt steady during gait training with cues for amb closer to the RW including during 180 deg turns  Information systems manager Rankin (Stroke Patients Only)       Balance Overall balance assessment: Needs assistance   Sitting balance-Leahy Scale: Good     Standing balance support: Bilateral upper extremity supported, During functional activity Standing balance-Leahy Scale: Good  Pertinent Vitals/Pain Pain Assessment Pain Assessment: No/denies pain    Home Living Family/patient expects to be discharged to:: Private residence Living Arrangements: Children Available Help at Discharge: Family;Available 24 hours/day Type of Home: House Home  Access: Stairs to enter Entrance Stairs-Rails: None Entrance Stairs-Number of Steps: 1+1   Home Layout: One level Home Equipment: Agricultural consultant (2 wheels);Shower seat;Cane - single point;Rollator (4 wheels);Grab bars - tub/shower;Grab bars - toilet Additional Comments: Pt lives with dtr who provides 24/7 supervision    Prior Function Prior Level of Function : Needs assist             Mobility Comments: Daughter provides SBA during ambulation with pt able to amb with a RW limited community distances.  Mostly only leaves the house for MD apts but able to walk from the parking lot into the office with her RW ADLs Comments: Mostly Ind with ADLs at baseline but daughter has been providing PRN assist over the last 1-2 weeks     Hand Dominance   Dominant Hand: Right    Extremity/Trunk Assessment   Upper Extremity Assessment Upper Extremity Assessment: Generalized weakness    Lower Extremity Assessment Lower Extremity Assessment: Generalized weakness       Communication   Communication: HOH  Cognition Arousal/Alertness: Awake/alert Behavior During Therapy: WFL for tasks assessed/performed Overall Cognitive Status: Within Functional Limits for tasks assessed                                          General Comments      Exercises     Assessment/Plan    PT Assessment Patient needs continued PT services  PT Problem List Decreased strength;Decreased activity tolerance;Decreased balance;Decreased mobility;Decreased knowledge of use of DME       PT Treatment Interventions DME instruction;Gait training;Stair training;Functional mobility training;Therapeutic activities;Therapeutic exercise;Balance training;Patient/family education    PT Goals (Current goals can be found in the Care Plan section)  Acute Rehab PT Goals Patient Stated Goal: To return home PT Goal Formulation: With patient Time For Goal Achievement: 04/18/23 Potential to Achieve Goals:  Good    Frequency Min 2X/week     Co-evaluation               AM-PAC PT "6 Clicks" Mobility  Outcome Measure Help needed turning from your back to your side while in a flat bed without using bedrails?: None Help needed moving from lying on your back to sitting on the side of a flat bed without using bedrails?: None Help needed moving to and from a bed to a chair (including a wheelchair)?: A Little Help needed standing up from a chair using your arms (e.g., wheelchair or bedside chair)?: A Little Help needed to walk in hospital room?: A Little Help needed climbing 3-5 steps with a railing? : A Little 6 Click Score: 20    End of Session Equipment Utilized During Treatment: Gait belt Activity Tolerance: Patient tolerated treatment well Patient left: Other (comment) (Pt left in bathroom with nursing and dtr present) Nurse Communication: Mobility status PT Visit Diagnosis: Difficulty in walking, not elsewhere classified (R26.2);Muscle weakness (generalized) (M62.81)    Time: 1450-1516 PT Time Calculation (min) (ACUTE ONLY): 26 min   Charges:   PT Evaluation $PT Eval Moderate Complexity: 1 Mod PT Treatments $Gait Training: 8-22 mins       D. Elly Modena PT, DPT 04/05/23, 3:33 PM

## 2023-04-05 NOTE — Assessment & Plan Note (Signed)
I think her shortness of breath secondary to multifocal pneumonia rather than heart failure.  BNP only 129.4.

## 2023-04-05 NOTE — TOC Initial Note (Signed)
Transition of Care [facility]) - Initial/Assessment Note    Patient Details  Name: Debbie Foster MRN: [id number] Date of Birth: 04/22/1925  [facility]) CM/SW Contact:    Marlowe Sax, RN Phone Number: 04/05/2023, 2:37 PM  Clinical Narrative:     Met with the patient in the room with her daughter, she lives at home and her daughter started staying with her full time, she wears glasses and has a hearing aide, she walks with a walker, she feels that she doe snot need additional DME, She was open with [facility] for Home health and would like to resume with them for PT Her daughter provides transportation              Expected Discharge Plan: Home w Home Health Services Barriers to Discharge: No Barriers Identified   Patient Goals and CMS Choice            Expected Discharge Plan and Services   Discharge Planning Services: CM Consult   Living arrangements for the past 2 months: Single Family Home                 DME Arranged: N/A DME Agency: NA       HH Arranged: PT HH Agency: [facility] Date HH Agency Contacted: 04/05/23 Time HH Agency Contacted: 1437 Representative spoke with at [facility] Agency: Elnita Maxwell  Prior Living Arrangements/Services Living arrangements for the past 2 months: Single Family Home Lives with:: Adult Children Patient language and need for interpreter reviewed:: Yes Do you feel safe going back to the place where you live?: Yes      Need for Family Participation in Patient Care: Yes (Comment) Care giver support system in place?: Yes (comment) Current home services: DME (Rw, rollator, lift chair, BSC) Criminal Activity/Legal Involvement Pertinent to Current Situation/Hospitalization: No - Comment as needed  Activities of Daily Living Home Assistive Devices/Equipment: Walker (specify type) ADL Screening (condition at time of admission) Patient's cognitive ability adequate to safely complete daily activities?: Yes Is the patient  deaf or have difficulty hearing?: Yes Does the patient have difficulty seeing, even when wearing glasses/contacts?: Yes Does the patient have difficulty concentrating, remembering, or making decisions?: No Patient able to express need for assistance with ADLs?: Yes Does the patient have difficulty dressing or bathing?: No Independently performs ADLs?: Yes (appropriate for developmental age) Does the patient have difficulty walking or climbing stairs?: No Weakness of Legs: None Weakness of Arms/Hands: None  Permission Sought/Granted   Permission granted to share information with : Yes, Verbal Permission Granted              Emotional Assessment Appearance:: Appears stated age Attitude/Demeanor/Rapport: Engaged Affect (typically observed): Pleasant Orientation: : Oriented to Self, Oriented to Place, Oriented to  Time, Oriented to Situation Alcohol / Substance Use: Not Applicable Psych Involvement: No (comment)  Admission diagnosis:  CAP (community acquired pneumonia) [J18.9] Pneumonia due to infectious organism, unspecified laterality, unspecified part of lung [J18.9] Patient Active Problem List   Diagnosis Date Noted   Multifocal pneumonia 04/05/2023   History of stroke 04/05/2023   Hyponatremia 04/05/2023   Thrombocytosis 04/05/2023   Chronic diastolic CHF (congestive heart failure) (HCC) 04/04/2023   HTN (hypertension) 04/04/2023   HLD (hyperlipidemia) 04/04/2023   Stroke (HCC) 04/04/2023   Chronic kidney disease, stage 3a (HCC) 04/04/2023   Protein-calorie malnutrition, severe (HCC) 04/04/2023   Syncope and collapse 07/28/2022   At risk for falls 07/28/2022   Anemia, unspecified 07/28/2022  Gastrointestinal hemorrhage with melena 07/28/2022   Rotator cuff arthropathy 08/05/2014   PCP:  Alan Mulder, MD Pharmacy:   [facility] - [address] [place] - [address] [place] [address] Phone: [phone] Fax:  [phone]  [facility] #[id number] [place], [place] - [address] AT [facility] [address] [place] [address] Phone: [phone] Fax: [phone]     Social Determinants of Health (SDOH) Social History: SDOH Screenings   Food Insecurity: No Food Insecurity (04/04/2023)  Housing: Low Risk  (04/04/2023)  Transportation Needs: No Transportation Needs (04/04/2023)  Utilities: Not At Risk (04/04/2023)  Tobacco Use: Medium Risk (04/04/2023)   SDOH Interventions:     Readmission Risk Interventions     No data to display

## 2023-04-05 NOTE — Assessment & Plan Note (Signed)
Dietitian consult.  BMI 16.34.  Underweight.

## 2023-04-05 NOTE — Assessment & Plan Note (Signed)
Likely secondary to pneumonia. 

## 2023-04-05 NOTE — Assessment & Plan Note (Signed)
Creatinine 0.98 with a GFR 53

## 2023-04-05 NOTE — Progress Notes (Signed)
  Progress Note   Patient: Debbie Foster JXB:[id number] DOB: 1925-01-08 DOA: 04/04/2023     1 DOS: the patient was seen and examined on 04/05/2023   Brief hospital course: 88 y.o. female with medical history significant of hypertension, hyperlipidemia, diastolic CHF, stroke, GERD, GI bleeding, CKD-3A, anemia, diverticulitis, migraine, who presents with cough and shortness of breath.   Patient states that she has SOB and cough for almost a week, she coughs up thick mucus, denies chest pain, fever or chills.  Assessment and Plan: * Multifocal pneumonia Bilateral pneumonia seen on x-ray.  Continue Rocephin and Zithromax.  Chronic diastolic CHF (congestive heart failure) (HCC) I think her shortness of breath secondary to multifocal pneumonia rather than heart failure.  BNP only 129.4.  HTN (hypertension) Currently on Norvasc, Avapro and hydrochlorothiazide  Anemia, unspecified Last hemoglobin 10.2  Stroke [facility]) Prior history of stroke on aspirin  Chronic kidney disease, stage 3a (HCC) Creatinine 0.98 with a GFR 53  Protein-calorie malnutrition, severe [facility]) Dietitian consult.  BMI 16.34.  Underweight.  Thrombocytosis Likely secondary to pneumonia.  Hyponatremia Sodium 132.  If continues to stay low can consider getting rid of hydrochlorothiazide.        Subjective: Patient felt short of breath when she came in.  Feeling better today than yesterday.  Admitted with bilateral pneumonia.  Physical Exam: Vitals:   04/04/23 1800 04/04/23 1806 04/05/23 0104 04/05/23 0845  BP:  105/61 117/62 127/77  Pulse: 70 74 63 74  Resp: 13 20 14 16   Temp:  97.7 F (36.5 C) 97.6 F (36.4 C) 98.1 F (36.7 C)  TempSrc:  Oral  Oral  SpO2: 97% 96% 94% 94%  Weight:      Height:       Physical Exam HENT:     Head: Normocephalic.     Mouth/Throat:     Pharynx: No oropharyngeal exudate.  Eyes:     General: Lids are normal.     Conjunctiva/sclera: Conjunctivae normal.  Cardiovascular:      Rate and Rhythm: Normal rate and regular rhythm.     Heart sounds: S1 normal and S2 normal. Murmur heard.     Systolic murmur is present with a grade of 2/6.  Pulmonary:     Breath sounds: Examination of the right-lower field reveals decreased breath sounds and rhonchi. Examination of the left-lower field reveals decreased breath sounds and rhonchi. Decreased breath sounds and rhonchi present.  Abdominal:     Palpations: Abdomen is soft.     Tenderness: There is no abdominal tenderness.  Musculoskeletal:     Right lower leg: No swelling.     Left lower leg: No swelling.  Skin:    General: Skin is warm.     Findings: No rash.  Neurological:     Mental Status: She is alert and oriented to person, place, and time.     Data Reviewed: Sodium 132, hemoglobin 10.2, platelet 416  Family Communication: Spoke with 2 daughters at the bedside  Disposition: Status is: Inpatient Remains inpatient appropriate because: Being treated for pneumonia with IV antibiotics  Planned Discharge Destination: Home likely on 5/9    Time spent: 28 minutes  Author: Alford Highland, MD 04/05/2023 1:47 PM  For on call review [email or web address].

## 2023-04-05 NOTE — Progress Notes (Signed)
Initial Nutrition Assessment  DOCUMENTATION CODES:   Underweight, Severe malnutrition in context of chronic illness  INTERVENTION:   -Liberalize diet to regular for wider variety of meal selections -MVI with minerals daily -Ensure Enlive po BID, each supplement provides 350 kcal and 20 grams of protein  NUTRITION DIAGNOSIS:   Severe Malnutrition related to chronic illness (CHF) as evidenced by estimated needs.  GOAL:   Patient will meet greater than or equal to 90% of their needs  MONITOR:   PO intake, Supplement acceptance  REASON FOR ASSESSMENT:   Consult Assessment of nutrition requirement/status  ASSESSMENT:   Pt with medical history significant of hypertension, hyperlipidemia, diastolic CHF, stroke, GERD, GI bleeding, CKD-3A, anemia, diverticulitis, migraine, who presents with cough and shortness of breath.  Pt admitted with CAP.   Pt talking with MD at time of visit.   Pt currently on a heart healthy diet. Noted meal completions 100%.    Reviewed wt hx; wt has been stable over the past 9 months.  Medications reviewed and include vitamin D3.   Labs reviewed: Na: 132.    NUTRITION - FOCUSED PHYSICAL EXAM:  Flowsheet Row Most Recent Value  Orbital Region Severe depletion  Upper Arm Region Severe depletion  Thoracic and Lumbar Region Severe depletion  Buccal Region Severe depletion  Temple Region Severe depletion  Clavicle Bone Region Severe depletion  Clavicle and Acromion Bone Region Severe depletion  Scapular Bone Region Severe depletion  Dorsal Hand Severe depletion  Patellar Region Severe depletion  Anterior Thigh Region Severe depletion  Edema (RD Assessment) None  Hair Reviewed  Eyes Reviewed  Mouth Reviewed  Skin Reviewed  Nails Reviewed       Diet Order:   Diet Order             Diet regular Fluid consistency: Thin  Diet effective now                   EDUCATION NEEDS:   No education needs have been identified at this  time  Skin:  Skin Assessment: Reviewed RN Assessment  Last BM:  04/03/23  Height:   Ht Readings from Last 1 Encounters:  04/04/23 5\' 1"  (1.549 m)    Weight:   Wt Readings from Last 1 Encounters:  04/04/23 39.2 kg    Ideal Body Weight:  47.7 kg  BMI:  Body mass index is 16.34 kg/m.  Estimated Nutritional Needs:   Kcal:  1400-1600  Protein:  60-75 grams  Fluid:  > 1.4 L    Levada Schilling, RD, LDN, CDCES Registered Dietitian II Certified Diabetes Care and Education Specialist Please refer to [facility] for RD and/or RD on-call/weekend/after hours pager

## 2023-04-05 NOTE — Assessment & Plan Note (Signed)
Prior history of stroke on aspirin

## 2023-04-05 NOTE — Assessment & Plan Note (Addendum)
Currently on Norvasc, Avapro and hydrochlorothiazide

## 2023-04-05 NOTE — Hospital Course (Signed)
89 y.o. female with medical history significant of hypertension, hyperlipidemia, diastolic CHF, stroke, GERD, GI bleeding, CKD-3A, anemia, diverticulitis, migraine, who presents with cough and shortness of breath.   Patient states that she has SOB and cough for almost a week, she coughs up thick mucus, denies chest pain, fever or chills.  5/8 and 5/9.  Patient feeling well.  Afebrile.  Slight cough.  Breathing comfortably on room air.  Patient stable for discharge home on 5/9.

## 2023-04-05 NOTE — Assessment & Plan Note (Signed)
Last hemoglobin 10.2

## 2023-04-06 DIAGNOSIS — E871 Hypo-osmolality and hyponatremia: Secondary | ICD-10-CM

## 2023-04-06 DIAGNOSIS — J189 Pneumonia, unspecified organism: Secondary | ICD-10-CM | POA: Diagnosis not present

## 2023-04-06 DIAGNOSIS — I10 Essential (primary) hypertension: Secondary | ICD-10-CM | POA: Diagnosis not present

## 2023-04-06 DIAGNOSIS — D649 Anemia, unspecified: Secondary | ICD-10-CM | POA: Diagnosis not present

## 2023-04-06 DIAGNOSIS — D75839 Thrombocytosis, unspecified: Secondary | ICD-10-CM

## 2023-04-06 DIAGNOSIS — I5032 Chronic diastolic (congestive) heart failure: Secondary | ICD-10-CM | POA: Diagnosis not present

## 2023-04-06 MED ORDER — CEFDINIR 300 MG PO CAPS: 300.0000 mg | ORAL_CAPSULE | Freq: Every day | ORAL | 0 refills | Status: AC

## 2023-04-06 MED ORDER — ENSURE ENLIVE PO LIQD: 237.0000 mL | Freq: Two times a day (BID) | ORAL | 0 refills | Status: DC

## 2023-04-06 MED ORDER — SODIUM CHLORIDE 0.9 % IV SOLN
500.0000 mg | INTRAVENOUS | Status: DC
  Administered 2023-04-06: 500 mg via INTRAVENOUS
  Filled 2023-04-06: qty 500

## 2023-04-06 MED ORDER — SODIUM CHLORIDE 0.9 % IV SOLN
1.0000 g | INTRAVENOUS | Status: DC
  Administered 2023-04-06: 1 g via INTRAVENOUS
  Filled 2023-04-06: qty 1

## 2023-04-06 NOTE — Plan of Care (Signed)
  Problem: Activity: Goal: Ability to tolerate increased activity will improve Outcome: Adequate for Discharge   Problem: Clinical Measurements: Goal: Ability to maintain a body temperature in the normal range will improve Outcome: Adequate for Discharge   Problem: Respiratory: Goal: Ability to maintain adequate ventilation will improve Outcome: Adequate for Discharge Goal: Ability to maintain a clear airway will improve Outcome: Adequate for Discharge   Problem: Education: Goal: Knowledge of General Education information will improve Description: Including pain rating scale, medication(s)/side effects and non-pharmacologic comfort measures Outcome: Adequate for Discharge   Problem: Health Behavior/Discharge Planning: Goal: Ability to manage health-related needs will improve Outcome: Adequate for Discharge   Problem: Clinical Measurements: Goal: Ability to maintain clinical measurements within normal limits will improve Outcome: Adequate for Discharge Goal: Will remain free from infection Outcome: Adequate for Discharge Goal: Diagnostic test results will improve Outcome: Adequate for Discharge Goal: Respiratory complications will improve Outcome: Adequate for Discharge Goal: Cardiovascular complication will be avoided Outcome: Adequate for Discharge   Problem: Activity: Goal: Risk for activity intolerance will decrease Outcome: Adequate for Discharge   Problem: Nutrition: Goal: Adequate nutrition will be maintained Outcome: Adequate for Discharge   Problem: Coping: Goal: Level of anxiety will decrease Outcome: Adequate for Discharge   Problem: Elimination: Goal: Will not experience complications related to bowel motility Outcome: Adequate for Discharge Goal: Will not experience complications related to urinary retention Outcome: Adequate for Discharge   Problem: Pain Managment: Goal: General experience of comfort will improve Outcome: Adequate for Discharge    Problem: Safety: Goal: Ability to remain free from injury will improve Outcome: Adequate for Discharge   Problem: Skin Integrity: Goal: Risk for impaired skin integrity will decrease Outcome: Adequate for Discharge   Problem: Malnutrition  (NI-5.2) Goal: Food and/or nutrient delivery Description: Individualized approach for food/nutrient provision. Outcome: Adequate for Discharge   Problem: Acute Rehab PT Goals(only PT should resolve) Goal: Pt Will Transfer Bed To Chair/Chair To Bed Outcome: Adequate for Discharge Goal: Pt Will Ambulate Outcome: Adequate for Discharge

## 2023-04-06 NOTE — TOC Transition Note (Signed)
Transition of Care [facility]) - CM/SW Discharge Note   Patient Details  Name: Debbie Foster MRN: [id number] Date of Birth: 03-26-1925  [facility]) CM/SW Contact:  Marlowe Sax, RN Phone Number: 04/06/2023, 9:21 AM   Clinical Narrative:     The patient to DC home with her daughter today, [name] is set up  Notified Elnita Maxwell AT [facility] of the DC DME at home No additional needs  Final next level of care: Home w Home Health Services Barriers to Discharge: No Barriers Identified   Patient Goals and CMS Choice      Discharge Placement                         Discharge Plan and Services Additional resources added to the After Visit Summary for     Discharge Planning Services: CM Consult            DME Arranged: N/A DME Agency: NA       HH Arranged: PT HH Agency: [facility] Date [facility] Agency Contacted: 04/05/23 Time HH Agency Contacted: 1437 Representative spoke with at [facility] Agency: Elnita Maxwell  Social Determinants of Health (SDOH) Interventions SDOH Screenings   Food Insecurity: No Food Insecurity (04/04/2023)  Housing: Low Risk  (04/04/2023)  Transportation Needs: No Transportation Needs (04/04/2023)  Utilities: Not At Risk (04/04/2023)  Tobacco Use: Medium Risk (04/04/2023)     Readmission Risk Interventions     No data to display

## 2023-04-06 NOTE — Progress Notes (Signed)
Reviewed discharge instructions with pt and daughter, Mindi Junker. Pt and daughter both verbalized understanding. Pt discharged with all personal belongings. Staff wheeled pt out. Pt transported to home via family vehicle.

## 2023-04-06 NOTE — Progress Notes (Signed)
Physical Therapy Treatment Patient Details Name: Debbie Foster MRN: [id number] DOB: 1925/05/13 Today's Date: 04/06/2023   History of Present Illness Pt is a 89 y.o. female with medical history significant of hypertension, hyperlipidemia, diastolic CHF, stroke, GERD, GI bleeding, CKD-3A, anemia, diverticulitis, migraine, who presents with cough and shortness of breath. MD assessment includes: multifocal pneumonia, anemia, hyponatremia, thrombocytosis, and severe protein-calorie malnutrition.    PT Comments    Pt likely to DC later today, fairly close-to-baseline evaluation previous day with our services. Pt given additional opportunity to mobilize prior to DC to home. Pt agreeable, pleasant as ever. DTR at bedside this morning. Pt takes a stroll out into hallway c RW, inititally sets her goal of turnaround at ~188ft, but then challenges herself to continue forward so long as Thereasa Parkin 'has a hold of (her)'. Pt is SOB after walk, but not abnormally so. She and DTR both feel comfortable with transition to home at DC.   Recommendations for follow up therapy are one component of a multi-disciplinary discharge planning process, led by the attending physician.  Recommendations may be updated based on patient status, additional functional criteria and insurance authorization.  Follow Up Recommendations       Assistance Recommended at Discharge Intermittent Supervision/Assistance  Patient can return home with the following A little help with walking and/or transfers;A little help with bathing/dressing/bathroom;Assistance with cooking/housework;Direct supervision/assist for medications management;Help with stairs or ramp for entrance;Assist for transportation   Equipment Recommendations  None recommended by PT    Recommendations for Other Services       Precautions / Restrictions Precautions Precautions: Fall Restrictions Weight Bearing Restrictions: No     Mobility  Bed Mobility                     Transfers Overall transfer level: Needs assistance Equipment used: Rolling walker (2 wheels) Transfers: Sit to/from Stand             General transfer comment: taking her time to recall, process, and demonstrate sequenital cues for transfers as given by PT previous day. No cues offered today.    Ambulation/Gait Ambulation/Gait assistance: Min guard, Supervision Gait Distance (Feet): 260 Feet Assistive device: Rolling walker (2 wheels)   Gait velocity: 0.64m/s     General Gait Details: has once instance of excessive sway outside of control, delayed awareness, LOB during 90 degree turn out of room.   Stairs             Wheelchair Mobility    Modified Rankin (Stroke Patients Only)       Balance                                            Cognition                                                Exercises      General Comments        Pertinent Vitals/Pain Pain Assessment Pain Assessment: No/denies pain    Home Living                          Prior Function  PT Goals (current goals can now be found in the care plan section) Acute Rehab PT Goals Patient Stated Goal: To return home PT Goal Formulation: With patient Time For Goal Achievement: 04/18/23 Potential to Achieve Goals: Good Progress towards PT goals: Progressing toward goals    Frequency    Min 2X/week      PT Plan Current plan remains appropriate    Co-evaluation              AM-PAC PT "6 Clicks" Mobility   Outcome Measure  Help needed turning from your back to your side while in a flat bed without using bedrails?: None Help needed moving from lying on your back to sitting on the side of a flat bed without using bedrails?: None Help needed moving to and from a bed to a chair (including a wheelchair)?: A Little Help needed standing up from a chair using your arms (e.g., wheelchair or bedside chair)?:  A Little Help needed to walk in hospital room?: A Little Help needed climbing 3-5 steps with a railing? : A Little 6 Click Score: 20    End of Session Equipment Utilized During Treatment: Gait belt Activity Tolerance: Patient tolerated treatment well;No increased pain;Patient limited by fatigue (quite SOB by return to walk, reported as typical) Patient left: in chair;with family/visitor present;with call bell/phone within reach Nurse Communication: Mobility status PT Visit Diagnosis: Difficulty in walking, not elsewhere classified (R26.2);Muscle weakness (generalized) (M62.81)     Time: 1610-9604 PT Time Calculation (min) (ACUTE ONLY): 16 min  Charges:  $Therapeutic Activity: 8-22 mins                    10:19 AM, 04/06/23 Rosamaria Lints, PT, DPT Physical Therapist - [facility]  [phone] (ASCOM)    Nylan Nakatani C 04/06/2023, 10:14 AM

## 2023-04-06 NOTE — Discharge Summary (Signed)
Physician Discharge Summary   Patient: Debbie Foster MRN: [id number] DOB: 1925/02/24  Admit date:     04/04/2023  Discharge date: 04/06/23  Discharge Physician: Alford Highland   PCP: Alan Mulder, MD   Recommendations at discharge:   Follow-up PCP 5 days  Discharge Diagnoses: Principal Problem:   Multifocal pneumonia Active Problems:   Chronic diastolic CHF (congestive heart failure) (HCC)   Anemia, unspecified   HTN (hypertension)   HLD (hyperlipidemia)   Stroke (HCC)   Chronic kidney disease, stage 3a (HCC)   Protein-calorie malnutrition, severe (HCC)   History of stroke   Hyponatremia   Thrombocytosis    Hospital Course: 89 y.o. female with medical history significant of hypertension, hyperlipidemia, diastolic CHF, stroke, GERD, GI bleeding, CKD-3A, anemia, diverticulitis, migraine, who presents with cough and shortness of breath.   Patient states that she has SOB and cough for almost a week, she coughs up thick mucus, denies chest pain, fever or chills.  5/8 and 5/9.  Patient feeling well.  Afebrile.  Slight cough.  Breathing comfortably on room air.  Patient stable for discharge home on 5/9.  Assessment and Plan: * Multifocal pneumonia Bilateral pneumonia seen on x-ray.  Received 3 days of IV Rocephin and high-dose Zithromax.  Will switch to 2 more days of Omnicef orally upon going home (because of her kidney function and age this will be dosed once a day).  Chronic diastolic CHF (congestive heart failure) (HCC) I think her shortness of breath secondary to multifocal pneumonia rather than heart failure.  BNP only 129.4.  HTN (hypertension) Currently on Norvasc, Avapro and hydrochlorothiazide  Anemia, unspecified Last hemoglobin 10.2  Stroke [facility]) Prior history of stroke on aspirin  Chronic kidney disease, stage 3a (HCC) Creatinine 0.98 with a GFR 53  Protein-calorie malnutrition, severe (HCC) BMI 16.34.  Underweight.  Thrombocytosis Likely  secondary to pneumonia.  Hyponatremia Sodium 132.  If continues to stay low can consider getting rid of hydrochlorothiazide.         Consultants: None Procedures performed: None Disposition: Home Diet recommendation:  Cardiac diet DISCHARGE MEDICATION: Allergies as of 04/06/2023   No Known Allergies      Medication List     STOP taking these medications    pantoprazole 40 MG tablet Commonly known as: PROTONIX   simvastatin 40 MG tablet Commonly known as: ZOCOR       TAKE these medications    aspirin 81 MG tablet Take 81 mg by mouth daily.   cefdinir 300 MG capsule Commonly known as: OMNICEF Take 1 capsule (300 mg total) by mouth daily for 2 days. Start taking on: Apr 07, 2023   Coenzyme Q10 100 MG capsule Take 100 mg by mouth daily.   Combigan 0.2-0.5 % ophthalmic solution Generic drug: brimonidine-timolol Place 1 drop into both eyes every 12 (twelve) hours.   feeding supplement Liqd Take 237 mLs by mouth 2 (two) times daily between meals.   metoprolol succinate 50 MG 24 hr tablet Commonly known as: TOPROL-XL Take 25 mg by mouth daily. Take half a tablet by mouth daily   nitroGLYCERIN 0.4 MG SL tablet Commonly known as: NITROSTAT Place 0.4 mg under the tongue every 5 (five) minutes as needed.   Olmesartan-amLODIPine-HCTZ 40-10-12.5 MG Tabs Take 1 tablet by mouth once daily. daily   prednisoLONE acetate 1 % ophthalmic suspension Commonly known as: PRED FORTE Place 1 drop into the right eye every morning.   trolamine salicylate 10 % cream Commonly known as: ASPERCREME  Apply 1 application topically as needed for muscle pain.   Vitamin D3 25 MCG (1000 UT) Caps Take 1,000 Units by mouth daily.        Follow-up Information     Morayati, Delsa Sale, MD Follow up in 5 day(s).   Specialty: Endocrinology Contact information: [address] [place] [address] [phone]         Alan Mulder, MD Follow up on 04/11/2023.    Specialty: Endocrinology Why: at [facility] information: [address] [place] [address] [phone]                Discharge Exam: Filed Weights   04/04/23 1617  Weight: 39.2 kg   Physical Exam HENT:     Head: Normocephalic.     Mouth/Throat:     Pharynx: No oropharyngeal exudate.  Eyes:     General: Lids are normal.     Conjunctiva/sclera: Conjunctivae normal.  Cardiovascular:     Rate and Rhythm: Normal rate and regular rhythm.     Heart sounds: S1 normal and S2 normal. Murmur heard.     Systolic murmur is present with a grade of 2/6.  Pulmonary:     Breath sounds: Examination of the right-lower field reveals decreased breath sounds and rhonchi. Examination of the left-lower field reveals decreased breath sounds and rhonchi. Decreased breath sounds and rhonchi present.  Abdominal:     Palpations: Abdomen is soft.     Tenderness: There is no abdominal tenderness.  Musculoskeletal:     Right lower leg: No swelling.     Left lower leg: No swelling.  Skin:    General: Skin is warm.     Findings: No rash.  Neurological:     Mental Status: She is alert and oriented to person, place, and time.      Condition at discharge: stable  The results of significant diagnostics from this hospitalization (including imaging, microbiology, ancillary and laboratory) are listed below for reference.   Imaging Studies: DG Chest 2 View  Result Date: 04/04/2023 CLINICAL DATA:  Shortness of breath. EXAM: CHEST - 2 VIEW COMPARISON:  July 25, 2014. FINDINGS: Stable cardiomediastinal silhouette. Status post right shoulder arthroplasty. Increased reticular densities are noted throughout both lungs, particularly in the lung bases. This may simply represent scarring or fibrosis, but acute superimposed atypical inflammation cannot be excluded. IMPRESSION: Increased diffuse reticular densities are noted bilaterally as described above. Electronically Signed   By: Lupita Raider  M.D.   On: 04/04/2023 12:04    Microbiology: Results for orders placed or performed during the hospital encounter of 04/04/23  SARS Coronavirus 2 by RT PCR (hospital order, performed in [facility] hospital lab) *cepheid single result test* Anterior Nasal Swab     Status: None   Collection Time: 04/04/23  1:16 PM   Specimen: Anterior Nasal Swab  Result Value Ref Range Status   SARS Coronavirus 2 by RT PCR NEGATIVE NEGATIVE Final    Comment: (NOTE) SARS-CoV-2 target nucleic acids are NOT DETECTED.  The SARS-CoV-2 RNA is generally detectable in upper and lower respiratory specimens during the acute phase of infection. The lowest concentration of SARS-CoV-2 viral copies this assay can detect is 250 copies / mL. A negative result does not preclude SARS-CoV-2 infection and should not be used as the sole basis for treatment or other patient management decisions.  A negative result may occur with improper specimen collection / handling, submission of specimen other than nasopharyngeal swab, presence of viral mutation(s)  within the areas targeted by this assay, and inadequate number of viral copies (<250 copies / mL). A negative result must be combined with clinical observations, patient history, and epidemiological information.  Fact Sheet for Patients:   RoadLapTop.co.za  Fact Sheet for Healthcare Providers: http://kim-miller.com/  This test is not yet approved or  cleared by the [place] FDA and has been authorized for detection and/or diagnosis of SARS-CoV-2 by FDA under an Emergency Use Authorization (EUA).  This EUA will remain in effect (meaning this test can be used) for the duration of the COVID-19 declaration under Section 564(b)(1) of the Act, 21 U.S.C. section 360bbb-3(b)(1), unless the authorization is terminated or revoked sooner.  Performed at [facility], [address] [place], [place] [address]   Culture,  blood (routine x 2) Call MD if unable to obtain prior to antibiotics being given     Status: None (Preliminary result)   Collection Time: 04/04/23  3:43 PM   Specimen: BLOOD  Result Value Ref Range Status   Specimen Description BLOOD LEFT ANTECUBITAL  Final   Special Requests   Final    BOTTLES DRAWN AEROBIC AND ANAEROBIC Blood Culture adequate volume   Culture   Final    NO GROWTH 2 DAYS Performed at [facility] [address] [place], [place] [address]    Report Status PENDING  Incomplete  Culture, blood (routine x 2) Call MD if unable to obtain prior to antibiotics being given     Status: None (Preliminary result)   Collection Time: 04/04/23  3:50 PM   Specimen: BLOOD  Result Value Ref Range Status   Specimen Description BLOOD RIGHT ANTECUBITAL  Final   Special Requests   Final    BOTTLES DRAWN AEROBIC AND ANAEROBIC Blood Culture adequate volume   Culture   Final    NO GROWTH 2 DAYS Performed at [facility], [address], [place], [place] [address]    Report Status PENDING  Incomplete    Labs: CBC: Recent Labs  Lab 04/04/23 1056 04/05/23 0516  WBC 18.8* 9.4  HGB 11.5* 10.2*  HCT 36.3 32.3*  MCV 84.8 84.6  PLT 476* 416*   Basic Metabolic Panel: Recent Labs  Lab 04/04/23 1056 04/05/23 0516  NA 130* 132*  K 4.0 3.7  CL 94* 97*  CO2 25 28  GLUCOSE 109* 92  BUN 19 19  CREATININE 0.97 0.98  CALCIUM 9.8 9.6     Discharge time spent: greater than 30 minutes.  Signed: Alford Highland, MD [facility] 04/06/2023

## 2023-04-06 NOTE — Plan of Care (Signed)
  Problem: Education: Goal: Knowledge of General Education information will improve Description: Including pain rating scale, medication(s)/side effects and non-pharmacologic comfort measures Outcome: Progressing   Problem: Health Behavior/Discharge Planning: Goal: Ability to manage health-related needs will improve Outcome: Progressing   Problem: Clinical Measurements: Goal: Ability to maintain clinical measurements within normal limits will improve Outcome: Progressing Goal: Diagnostic test results will improve Outcome: Progressing Goal: Respiratory complications will improve Outcome: Progressing Goal: Cardiovascular complication will be avoided Outcome: Progressing   Problem: Nutrition: Goal: Adequate nutrition will be maintained Outcome: Progressing   Problem: Elimination: Goal: Will not experience complications related to bowel motility Outcome: Progressing Goal: Will not experience complications related to urinary retention Outcome: Progressing

## 2023-04-07 LAB — CULTURE, BLOOD (ROUTINE X 2): Special Requests: ADEQUATE

## 2023-04-08 LAB — CULTURE, BLOOD (ROUTINE X 2): Special Requests: ADEQUATE

## 2023-04-09 LAB — CULTURE, BLOOD (ROUTINE X 2)
Culture: NO GROWTH
Culture: NO GROWTH

## 2023-04-10 ENCOUNTER — Encounter: Payer: Self-pay | Admitting: Internal Medicine

## 2023-04-10 ENCOUNTER — Telehealth: Payer: Self-pay | Admitting: Internal Medicine

## 2023-04-10 ENCOUNTER — Ambulatory Visit (INDEPENDENT_AMBULATORY_CARE_PROVIDER_SITE_OTHER): Payer: Medicare Other | Admitting: Internal Medicine

## 2023-04-10 VITALS — BP 110/83 | HR 73 | Temp 97.6°F | Resp 16 | Ht 61.0 in | Wt 86.2 lb

## 2023-04-10 DIAGNOSIS — R0602 Shortness of breath: Secondary | ICD-10-CM | POA: Diagnosis not present

## 2023-04-10 DIAGNOSIS — J189 Pneumonia, unspecified organism: Secondary | ICD-10-CM | POA: Diagnosis not present

## 2023-04-10 NOTE — Telephone Encounter (Signed)
Notified daughter of CT appointment date, arrival time, location-Toni

## 2023-04-10 NOTE — Progress Notes (Signed)
[facility] [address] [place], [place] [address]  Internal MEDICINE  Office Visit Note  Patient Name: Debbie Foster  [id number]  [id number]  Date of Service: 04/16/2023   Complaints/HPI Pt is here for establishment of PCP. Chief Complaint  Patient presents with   New Patient (Initial Visit)   Shortness of Breath    Patient was in hospital last week for pneumonia. Given IV antibiotics.    HPI Pt is here with her daughter. Pt is seen for Pulmonary today She has recent admission to [facility] with possible dx of multifocal pneumonia, she has been treated with IV abx, she was on oxygen as well but was not sent home with it  2. According to her daughter that over the last few months, pt has lost wt and has become more frail   3. She gets sob with minimum exertion, uses a walker at the moment  She denies any fever or chills but wt loss is present 4. Lately she has been drinking Ensure   Current Medication: Outpatient Encounter Medications as of 04/10/2023  Medication Sig Note   aspirin 81 MG tablet Take 81 mg by mouth daily.    benzonatate (TESSALON) 100 MG capsule Take 100 mg by mouth as needed for cough.    brimonidine-timolol (COMBIGAN) 0.2-0.5 % ophthalmic solution Place 1 drop into both eyes every 12 (twelve) hours.    Cholecalciferol (VITAMIN D3) 1000 UNITS CAPS Take 1,000 Units by mouth daily.    Coenzyme Q10 100 MG capsule Take 100 mg by mouth daily.    feeding supplement (ENSURE ENLIVE / ENSURE PLUS) LIQD Take 237 mLs by mouth 2 (two) times daily between meals.    metoprolol succinate (TOPROL-XL) 50 MG 24 hr tablet Take 25 mg by mouth daily. Take half a tablet by mouth daily 07/28/2022: Half tab   nitroGLYCERIN (NITROSTAT) 0.4 MG SL tablet Place 0.4 mg under the tongue every 5 (five) minutes as needed.     Olmesartan-amLODIPine-HCTZ 40-10-12.5 MG TABS Take 1 tablet by mouth once daily. daily    prednisoLONE acetate (PRED FORTE) 1 % ophthalmic suspension Place 1  drop into the right eye every morning.    trolamine salicylate (ASPERCREME) 10 % cream Apply 1 application topically as needed for muscle pain.    No facility-administered encounter medications on file as of 04/10/2023.    Surgical History: Past Surgical History:  Procedure Laterality Date   CARPAL TUNNEL RELEASE Right    CATARACT EXTRACTION W/ INTRAOCULAR LENS IMPLANT Right 2003   CATARACT EXTRACTION W/ INTRAOCULAR LENS IMPLANT Left 2006   JOINT REPLACEMENT     REVERSE SHOULDER ARTHROPLASTY Right 08/05/2014   REVERSE SHOULDER ARTHROPLASTY Right 08/05/2014   Procedure: REVERSE SHOULDER ARTHROPLASTY;  Surgeon: Mable Paris, MD;  Location: [facility] OR;  Service: Orthopedics;  Laterality: Right;  Right reverse total shoulder arthroplasty   TONSILLECTOMY     TOTAL HIP ARTHROPLASTY Left 1998   TOTAL KNEE ARTHROPLASTY Right 2005    Medical History: Past Medical History:  Diagnosis Date   Arthritis    "just about q bone in my body"   Blood dyscrasia    Bronchitis 11/2013   "first time I've ever had it; still coughing from it now" (08/05/2014)   Diverticulitis 2004   Double vision 2012   "left eye; from the eye stroke; can see fine w/my glasses"   GERD (gastroesophageal reflux disease)    h/o uses OTC   Heart murmur    High cholesterol  Hypertension    Migraine    "stopped w/the change of life; age 11"   Squamous carcinoma    arms and face   Stroke (HCC)    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    Family History: Family History  Problem Relation Age of Onset   Heart attack Mother    Heart attack Brother     Social History   Socioeconomic History   Marital status: Widowed    Spouse name: Not on file   Number of children: Not on file   Years of education: Not on file   Highest education level: Not on file  Occupational History   Not on file  Tobacco Use   Smoking status: Former    Packs/day: 0    Types: Cigarettes   Smokeless tobacco: Never   Tobacco comments:     "smoked less than 1 pack of cigarettes in my life; didn't like it"  Substance and Sexual Activity   Alcohol use: No   Drug use: No   Sexual activity: Never  Other Topics Concern   Not on file  Social History Narrative   Not on file   Social Determinants of Health   Financial Resource Strain: Not on file  Food Insecurity: No Food Insecurity (04/04/2023)   Hunger Vital Sign    Worried About Running Out of Food in the Last Year: Never true    Ran Out of Food in the Last Year: Never true  Transportation Needs: No Transportation Needs (04/04/2023)   PRAPARE - Administrator, Civil Service (Medical): No    Lack of Transportation (Non-Medical): No  Physical Activity: Not on file  Stress: Not on file  Social Connections: Not on file  Intimate Partner Violence: Not At Risk (04/04/2023)   Humiliation, Afraid, Rape, and Kick questionnaire    Fear of Current or Ex-Partner: No    Emotionally Abused: No    Physically Abused: No    Sexually Abused: No     Review of Systems  Constitutional:  Positive for fatigue and unexpected weight change. Negative for chills.  HENT:  Negative for congestion, postnasal drip, rhinorrhea, sneezing and sore throat.   Eyes:  Negative for redness.  Respiratory:  Positive for shortness of breath. Negative for cough and chest tightness.   Cardiovascular:  Negative for chest pain and palpitations.  Gastrointestinal:  Negative for abdominal pain, constipation, diarrhea, nausea and vomiting.  Genitourinary:  Negative for dysuria and frequency.  Musculoskeletal:  Positive for gait problem. Negative for arthralgias, back pain, joint swelling and neck pain.  Skin:  Negative for rash.  Neurological:  Positive for weakness. Negative for tremors and numbness.  Hematological:  Negative for adenopathy. Does not bruise/bleed easily.  Psychiatric/Behavioral:  Negative for behavioral problems (Depression), sleep disturbance and suicidal ideas. The patient is not  nervous/anxious.     Vital Signs: BP 110/83   Pulse 73   Temp 97.6 F (36.4 C)   Resp 16   Ht 5\' 1"  (1.549 m)   Wt 86 lb 3.2 oz (39.1 kg)   SpO2 97%   PF (!) 2 L/min   BMI 16.29 kg/m    Physical Exam Constitutional:      Appearance: Normal appearance.  HENT:     Head: Normocephalic and atraumatic.     Nose: Nose normal.     Mouth/Throat:     Mouth: Mucous membranes are moist.     Pharynx: No posterior oropharyngeal erythema.  Eyes:  Extraocular Movements: Extraocular movements intact.     Pupils: Pupils are equal, round, and reactive to light.  Cardiovascular:     Pulses: Normal pulses.     Heart sounds: Normal heart sounds.  Pulmonary:     Effort: Pulmonary effort is normal.     Breath sounds: Examination of the left-lower field reveals decreased breath sounds. Decreased breath sounds present.  Neurological:     General: No focal deficit present.     Mental Status: She is alert.  Psychiatric:        Mood and Affect: Mood normal.        Behavior: Behavior normal.       Assessment/Plan: 1. Multifocal pneumonia CXR reviewed and is abnormal, will need HRCT - CT Chest High Resolution; Future  2. SOB (shortness of breath) - Spirometry with Graph, poor effort, will need PFT   General Counseling: Hermina verbalizes understanding of the findings of todays visit and agrees with plan of treatment. I have discussed any further diagnostic evaluation that may be needed or ordered today. We also reviewed her medications today. she has been encouraged to call the office with any questions or concerns that should arise related to todays visit.    Counseling:  [place] Controlled Substance Database was reviewed by me.  Orders Placed This Encounter  Procedures   CT Chest High Resolution   Spirometry with Graph    No orders of the defined types were placed in this encounter.   Time spent:35 Minutes

## 2023-04-25 ENCOUNTER — Ambulatory Visit
Admission: RE | Admit: 2023-04-25 | Discharge: 2023-04-25 | Disposition: A | Discharge status: Home / Self Care | Payer: Medicare Other | Source: Ambulatory Visit | Attending: Internal Medicine | Admitting: Internal Medicine

## 2023-04-25 DIAGNOSIS — J189 Pneumonia, unspecified organism: Secondary | ICD-10-CM | POA: Diagnosis present

## 2023-05-09 ENCOUNTER — Ambulatory Visit: Payer: Medicare Other | Admitting: Internal Medicine

## 2023-05-12 ENCOUNTER — Encounter: Payer: Self-pay | Admitting: Physician Assistant

## 2023-05-12 ENCOUNTER — Ambulatory Visit (INDEPENDENT_AMBULATORY_CARE_PROVIDER_SITE_OTHER): Payer: Medicare Other | Admitting: Physician Assistant

## 2023-05-12 VITALS — BP 110/60 | HR 63 | Temp 97.8°F | Resp 16 | Ht 61.0 in | Wt 84.4 lb

## 2023-05-12 DIAGNOSIS — J8410 Pulmonary fibrosis, unspecified: Secondary | ICD-10-CM | POA: Diagnosis not present

## 2023-05-12 DIAGNOSIS — R911 Solitary pulmonary nodule: Secondary | ICD-10-CM

## 2023-05-12 DIAGNOSIS — I359 Nonrheumatic aortic valve disorder, unspecified: Secondary | ICD-10-CM

## 2023-05-12 NOTE — Progress Notes (Signed)
[facility] [address] [place], [place] [address]  Pulmonary Sleep Medicine   Office Visit Note  Patient Name: Debbie Foster DOB: 05-03-1925 MRN [id number]  Date of Service: 05/12/2023  Complaints/HPI: Pt is here for pulmonary follow up to review CT chest. Her daughter is with her. Her PCP gave her Trelegy, but she is not using daily. Started a bout 1 month ago. Only using as needed when SOB. Reports only feeling SOB if exerting herself. Overall feels breathing has been getting better.  States she did not need oxygen at discharge and reports not wearing oxygen in hospital when up with PT, etc. She is at 94-96% in office while walking to check out. Unable to to a full 6 min walk test physically, but no signs of desats on ambulation for the distance she is normally able to walk. Denies any wheezing, has occasional cough. Ct reviewed and does show evidence of pulmonary fibrosis as well as a lung nodule that will need further monitoring. She does also show aortic and coronary calcifications. Previous echo shows mild AR, no AS with moderate calcification seen. Reports she has follow up with PCP next week.   ROS  General: (-) fever, (-) chills, (-) night sweats, (-) weakness Skin: (-) rashes, (-) itching,. Eyes: (-) visual changes, (-) redness, (-) itching. Nose and Sinuses: (-) nasal stuffiness or itchiness, (-) postnasal drip, (-) nosebleeds, (-) sinus trouble. Mouth and Throat: (-) sore throat, (-) hoarseness. Neck: (-) swollen glands, (-) enlarged thyroid, (-) neck pain. Respiratory: + cough, (-) bloody sputum, + shortness of breath, - wheezing. Cardiovascular: - ankle swelling, (-) chest pain. Lymphatic: (-) lymph node enlargement. Neurologic: (-) numbness, (-) tingling. Psychiatric: (-) anxiety, (-) depression   Current Medication: Outpatient Encounter Medications as of 05/12/2023  Medication Sig Note   aspirin 81 MG tablet Take 81 mg by mouth daily.    benzonatate  (TESSALON) 100 MG capsule Take 100 mg by mouth as needed for cough.    brimonidine-timolol (COMBIGAN) 0.2-0.5 % ophthalmic solution Place 1 drop into both eyes every 12 (twelve) hours.    Cholecalciferol (VITAMIN D3) 1000 UNITS CAPS Take 1,000 Units by mouth daily.    Coenzyme Q10 100 MG capsule Take 100 mg by mouth daily.    feeding supplement (ENSURE ENLIVE / ENSURE PLUS) LIQD Take 237 mLs by mouth 2 (two) times daily between meals.    metoprolol succinate (TOPROL-XL) 50 MG 24 hr tablet Take 25 mg by mouth daily. Take half a tablet by mouth daily 07/28/2022: Half tab   nitroGLYCERIN (NITROSTAT) 0.4 MG SL tablet Place 0.4 mg under the tongue every 5 (five) minutes as needed.     Olmesartan-amLODIPine-HCTZ 40-10-12.5 MG TABS Take 1 tablet by mouth once daily. daily    prednisoLONE acetate (PRED FORTE) 1 % ophthalmic suspension Place 1 drop into the right eye every morning.    trolamine salicylate (ASPERCREME) 10 % cream Apply 1 application topically as needed for muscle pain.    No facility-administered encounter medications on file as of 05/12/2023.    Surgical History: Past Surgical History:  Procedure Laterality Date   CARPAL TUNNEL RELEASE Right    CATARACT EXTRACTION W/ INTRAOCULAR LENS IMPLANT Right 2003   CATARACT EXTRACTION W/ INTRAOCULAR LENS IMPLANT Left 2006   JOINT REPLACEMENT     REVERSE SHOULDER ARTHROPLASTY Right 08/05/2014   REVERSE SHOULDER ARTHROPLASTY Right 08/05/2014   Procedure: REVERSE SHOULDER ARTHROPLASTY;  Surgeon: Mable Paris, MD;  Location: [facility] OR;  Service:  Orthopedics;  Laterality: Right;  Right reverse total shoulder arthroplasty   TONSILLECTOMY     TOTAL HIP ARTHROPLASTY Left 1998   TOTAL KNEE ARTHROPLASTY Right 2005    Medical History: Past Medical History:  Diagnosis Date   Arthritis    "just about q bone in my body"   Blood dyscrasia    Bronchitis 11/2013   "first time I've ever had it; still coughing from it now" (08/05/2014)   Diverticulitis  2004   Double vision 2012   "left eye; from the eye stroke; can see fine w/my glasses"   GERD (gastroesophageal reflux disease)    h/o uses OTC   Heart murmur    High cholesterol    Hypertension    Migraine    "stopped w/the change of life; age 50"   Squamous carcinoma    arms and face   Stroke (HCC)    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    Family History: Family History  Problem Relation Age of Onset   Heart attack Mother    Heart attack Brother     Social History: Social History   Socioeconomic History   Marital status: Widowed    Spouse name: Not on file   Number of children: Not on file   Years of education: Not on file   Highest education level: Not on file  Occupational History   Not on file  Tobacco Use   Smoking status: Former    Packs/day: 0    Types: Cigarettes   Smokeless tobacco: Never   Tobacco comments:    "smoked less than 1 pack of cigarettes in my life; didn't like it"  Substance and Sexual Activity   Alcohol use: No   Drug use: No   Sexual activity: Never  Other Topics Concern   Not on file  Social History Narrative   Not on file   Social Determinants of Health   Financial Resource Strain: Not on file  Food Insecurity: No Food Insecurity (04/04/2023)   Hunger Vital Sign    Worried About Running Out of Food in the Last Year: Never true    Ran Out of Food in the Last Year: Never true  Transportation Needs: No Transportation Needs (04/04/2023)   PRAPARE - Administrator, Civil Service (Medical): No    Lack of Transportation (Non-Medical): No  Physical Activity: Not on file  Stress: Not on file  Social Connections: Not on file  Intimate Partner Violence: Not At Risk (04/04/2023)   Humiliation, Afraid, Rape, and Kick questionnaire    Fear of Current or Ex-Partner: No    Emotionally Abused: No    Physically Abused: No    Sexually Abused: No    Vital Signs: Blood pressure 110/60, pulse 63, temperature 97.8 F (36.6 C), resp. rate  16, height 5\' 1"  (1.549 m), weight 84 lb 6.4 oz (38.3 kg), SpO2 93 %.  Examination: General Appearance: The patient is well-developed, well-nourished, and in no distress. Skin: Gross inspection of skin unremarkable. Head: normocephalic, no gross deformities. Eyes: no gross deformities noted. ENT: ears appear grossly normal no exudates. Neck: Supple. No thyromegaly. No LAD. Respiratory: No wheezing on rhonchi. Cardiovascular: Normal S1 and S2 without murmur or rub. Extremities: No cyanosis. pulses are equal. Neurologic: Alert and oriented. No involuntary movements.  LABS: Recent Results (from the past 2160 hour(s))  Basic metabolic panel     Status: Abnormal   Collection Time: 04/04/23 10:56 AM  Result Value Ref Range  Sodium 130 (L) 135 - 145 mmol/L   Potassium 4.0 3.5 - 5.1 mmol/L   Chloride 94 (L) 98 - 111 mmol/L   CO2 25 22 - 32 mmol/L   Glucose, Bld 109 (H) 70 - 99 mg/dL    Comment: Glucose reference range applies only to samples taken after fasting for at least 8 hours.   BUN 19 8 - 23 mg/dL   Creatinine, Ser 4.09 0.44 - 1.00 mg/dL   Calcium 9.8 8.9 - 81.1 mg/dL   GFR, Estimated 53 (L) >60 mL/min    Comment: (NOTE) Calculated using the CKD-EPI Creatinine Equation (2021)    Anion gap 11 5 - 15    Comment: Performed at [facility], [address] [place], [place] [address]  CBC     Status: Abnormal   Collection Time: 04/04/23 10:56 AM  Result Value Ref Range   WBC 18.8 (H) 4.0 - 10.5 K/uL   RBC 4.28 3.87 - 5.11 MIL/uL   Hemoglobin 11.5 (L) 12.0 - 15.0 g/dL   HCT 29.5 62.1 - 30.8 %   MCV 84.8 80.0 - 100.0 fL   MCH 26.9 26.0 - 34.0 pg   MCHC 31.7 30.0 - 36.0 g/dL   RDW 65.7 (H) 84.6 - 96.2 %   Platelets 476 (H) 150 - 400 K/uL   nRBC 0.0 0.0 - 0.2 %    Comment: Performed at [facility], [address] [place], [place] [address]  Brain natriuretic peptide     Status: Abnormal   Collection Time: 04/04/23 10:56 AM  Result Value Ref Range   B  Natriuretic Peptide 129.4 (H) 0.0 - 100.0 pg/mL    Comment: Performed at [facility], [address] [place], [place] [address]  Procalcitonin     Status: None   Collection Time: 04/04/23 10:56 AM  Result Value Ref Range   Procalcitonin <0.10 ng/mL    Comment:        Interpretation: PCT (Procalcitonin) <= 0.5 ng/mL: Systemic infection (sepsis) is not likely. Local bacterial infection is possible. (NOTE)       Sepsis PCT Algorithm           Lower Respiratory Tract                                      Infection PCT Algorithm    ----------------------------     ----------------------------         PCT < 0.25 ng/mL                PCT < 0.10 ng/mL          Strongly encourage             Strongly discourage   discontinuation of antibiotics    initiation of antibiotics    ----------------------------     -----------------------------       PCT 0.25 - 0.50 ng/mL            PCT 0.10 - 0.25 ng/mL               OR       >80% decrease in PCT            Discourage initiation of  antibiotics      Encourage discontinuation           of antibiotics    ----------------------------     -----------------------------         PCT >= 0.50 ng/mL              PCT 0.26 - 0.50 ng/mL               AND        <80% decrease in PCT             Encourage initiation of                                             antibiotics       Encourage continuation           of antibiotics    ----------------------------     -----------------------------        PCT >= 0.50 ng/mL                  PCT > 0.50 ng/mL               AND         increase in PCT                  Strongly encourage                                      initiation of antibiotics    Strongly encourage escalation           of antibiotics                                     -----------------------------                                           PCT <= 0.25 ng/mL                                                  OR                                        > 80% decrease in PCT                                      Discontinue / Do not initiate                                             antibiotics  Performed at [facility], [address] [place], [place] [address]   SARS Coronavirus 2 by RT PCR (hospital order, performed in [facility]  hospital lab) *cepheid single result test* Anterior Nasal Swab     Status: None   Collection Time: 04/04/23  1:16 PM   Specimen: Anterior Nasal Swab  Result Value Ref Range   SARS Coronavirus 2 by RT PCR NEGATIVE NEGATIVE    Comment: (NOTE) SARS-CoV-2 target nucleic acids are NOT DETECTED.  The SARS-CoV-2 RNA is generally detectable in upper and lower respiratory specimens during the acute phase of infection. The lowest concentration of SARS-CoV-2 viral copies this assay can detect is 250 copies / mL. A negative result does not preclude SARS-CoV-2 infection and should not be used as the sole basis for treatment or other patient management decisions.  A negative result may occur with improper specimen collection / handling, submission of specimen other than nasopharyngeal swab, presence of viral mutation(s) within the areas targeted by this assay, and inadequate number of viral copies (<250 copies / mL). A negative result must be combined with clinical observations, patient history, and epidemiological information.  Fact Sheet for Patients:   RoadLapTop.co.za  Fact Sheet for Healthcare Providers: http://kim-miller.com/  This test is not yet approved or  cleared by the [place] FDA and has been authorized for detection and/or diagnosis of SARS-CoV-2 by FDA under an Emergency Use Authorization (EUA).  This EUA will remain in effect (meaning this test can be used) for the duration of the COVID-19 declaration under Section 564(b)(1) of the Act, 21 U.S.C. section 360bbb-3(b)(1), unless  the authorization is terminated or revoked sooner.  Performed at [facility], [address] [place], [place] [address]   Culture, blood (routine x 2) Call MD if unable to obtain prior to antibiotics being given     Status: None   Collection Time: 04/04/23  3:43 PM   Specimen: BLOOD  Result Value Ref Range   Specimen Description BLOOD LEFT ANTECUBITAL    Special Requests      BOTTLES DRAWN AEROBIC AND ANAEROBIC Blood Culture adequate volume   Culture      NO GROWTH 5 DAYS Performed at [facility], [address] [place], [place] [address]    Report Status 04/09/2023 FINAL   Culture, blood (routine x 2) Call MD if unable to obtain prior to antibiotics being given     Status: None   Collection Time: 04/04/23  3:50 PM   Specimen: BLOOD  Result Value Ref Range   Specimen Description BLOOD RIGHT ANTECUBITAL    Special Requests      BOTTLES DRAWN AEROBIC AND ANAEROBIC Blood Culture adequate volume   Culture      NO GROWTH 5 DAYS Performed at [facility], [address] [place], [place] [address]    Report Status 04/09/2023 FINAL   Basic metabolic panel     Status: Abnormal   Collection Time: 04/05/23  5:16 AM  Result Value Ref Range   Sodium 132 (L) 135 - 145 mmol/L   Potassium 3.7 3.5 - 5.1 mmol/L   Chloride 97 (L) 98 - 111 mmol/L   CO2 28 22 - 32 mmol/L   Glucose, Bld 92 70 - 99 mg/dL    Comment: Glucose reference range applies only to samples taken after fasting for at least 8 hours.   BUN 19 8 - 23 mg/dL   Creatinine, Ser 9.14 0.44 - 1.00 mg/dL   Calcium 9.6 8.9 - 78.2 mg/dL   GFR, Estimated 53 (L) >60 mL/min    Comment: (NOTE) Calculated using the CKD-EPI Creatinine Equation (2021)    Anion  gap 7 5 - 15    Comment: Performed at [facility], [address] [place], [place] [address]  CBC     Status: Abnormal   Collection Time: 04/05/23  5:16 AM  Result Value Ref Range   WBC 9.4 4.0 - 10.5 K/uL   RBC 3.82 (L) 3.87 - 5.11  MIL/uL   Hemoglobin 10.2 (L) 12.0 - 15.0 g/dL   HCT 60.4 (L) 54.0 - 98.1 %   MCV 84.6 80.0 - 100.0 fL   MCH 26.7 26.0 - 34.0 pg   MCHC 31.6 30.0 - 36.0 g/dL   RDW 19.1 47.8 - 29.5 %   Platelets 416 (H) 150 - 400 K/uL   nRBC 0.0 0.0 - 0.2 %    Comment: Performed at [facility], [address] [place], [place] [address]    Radiology: CT Chest High Resolution  Result Date: 04/29/2023 CLINICAL DATA:  Follow-up abnormal chest radiograph EXAM: CT CHEST WITHOUT CONTRAST TECHNIQUE: Multidetector CT imaging of the chest was performed following the standard protocol without intravenous contrast. High resolution imaging of the lungs, as well as inspiratory and expiratory imaging, was performed. RADIATION DOSE REDUCTION: This exam was performed according to the departmental dose-optimization program which includes automated exposure control, adjustment of the mA and/or kV according to patient size and/or use of iterative reconstruction technique. COMPARISON:  Chest x-ray dated May seventh 2024 FINDINGS: Cardiovascular: Cardiomegaly. No pericardial effusion. Normal caliber thoracic aorta with severe calcified plaque. Severe coronary artery calcifications. Moderate aortic valve calcifications. Mediastinum/Nodes: Esophagus and thyroid are unremarkable. No enlarged lymph nodes seen in the chest. Lungs/Pleura: Central airways are patent. Can not evaluate for air trapping since expiratory phase images are technically inadequate. Subpleural and basilar predominant reticular opacities with associated traction bronchiolectasis and bronchiectasis. Probable early honeycomb change, for example right lower lobe on series 10, image 141. Consolidations of the bilateral lower lobes. No pleural effusion or pneumothorax. Small solid pulmonary nodule of the right middle lobe measuring 6 mm on series 10 image 199. Upper Abdomen: No acute abnormality. Musculoskeletal: Prior total right shoulder arthroplasty. Levocurvature  of the lower thoracic upper lumbar spine. IMPRESSION: 1. Subpleural and basilar predominant reticular opacities associated traction bronchiolectasis and bronchiectasis. Probable early honeycomb change. Findings are categorized as probable UIP per consensus guidelines: Diagnosis of Idiopathic Pulmonary Fibrosis: An Official ATS/ERS/JRS/ALAT Clinical Practice Guideline. Am Rosezetta Schlatter Crit Care Med Vol 198, Iss 5, [phone], Jul 29 2017. 2. Consolidations of the bilateral lower lobes, likely due to focal fibrosis, although infection or aspiration could appear similar. 3. Small solid pulmonary nodule of the right middle lobe measuring 6 mm. Non-contrast chest CT at 6-12 months is recommended. If the nodule is stable at time of repeat CT, then future CT at 18-24 months (from today's scan) is considered optional for low-risk patients, but is recommended for high-risk patients. This recommendation follows the consensus statement: Guidelines for Management of Incidental Pulmonary Nodules Detected on CT Images: From the Fleischner Society 2017; Radiology 2017; 284:228-243. 4. Severe coronary artery calcifications. 5. Moderate aortic valve calcifications. Recommend correlation with echocardiogram to evaluate for aortic stenosis. 6. Aortic Atherosclerosis (ICD10-I70.0). Electronically Signed   By: Allegra Lai M.D.   On: 04/29/2023 18:01    No results found.  CT Chest High Resolution  Result Date: 04/29/2023 CLINICAL DATA:  Follow-up abnormal chest radiograph EXAM: CT CHEST WITHOUT CONTRAST TECHNIQUE: Multidetector CT imaging of the chest was performed following the standard protocol without intravenous contrast. High resolution imaging of the  lungs, as well as inspiratory and expiratory imaging, was performed. RADIATION DOSE REDUCTION: This exam was performed according to the departmental dose-optimization program which includes automated exposure control, adjustment of the mA and/or kV according to patient size and/or  use of iterative reconstruction technique. COMPARISON:  Chest x-ray dated May seventh 2024 FINDINGS: Cardiovascular: Cardiomegaly. No pericardial effusion. Normal caliber thoracic aorta with severe calcified plaque. Severe coronary artery calcifications. Moderate aortic valve calcifications. Mediastinum/Nodes: Esophagus and thyroid are unremarkable. No enlarged lymph nodes seen in the chest. Lungs/Pleura: Central airways are patent. Can not evaluate for air trapping since expiratory phase images are technically inadequate. Subpleural and basilar predominant reticular opacities with associated traction bronchiolectasis and bronchiectasis. Probable early honeycomb change, for example right lower lobe on series 10, image 141. Consolidations of the bilateral lower lobes. No pleural effusion or pneumothorax. Small solid pulmonary nodule of the right middle lobe measuring 6 mm on series 10 image 199. Upper Abdomen: No acute abnormality. Musculoskeletal: Prior total right shoulder arthroplasty. Levocurvature of the lower thoracic upper lumbar spine. IMPRESSION: 1. Subpleural and basilar predominant reticular opacities associated traction bronchiolectasis and bronchiectasis. Probable early honeycomb change. Findings are categorized as probable UIP per consensus guidelines: Diagnosis of Idiopathic Pulmonary Fibrosis: An Official ATS/ERS/JRS/ALAT Clinical Practice Guideline. Am Rosezetta Schlatter Crit Care Med Vol 198, Iss 5, [phone], Jul 29 2017. 2. Consolidations of the bilateral lower lobes, likely due to focal fibrosis, although infection or aspiration could appear similar. 3. Small solid pulmonary nodule of the right middle lobe measuring 6 mm. Non-contrast chest CT at 6-12 months is recommended. If the nodule is stable at time of repeat CT, then future CT at 18-24 months (from today's scan) is considered optional for low-risk patients, but is recommended for high-risk patients. This recommendation follows the consensus  statement: Guidelines for Management of Incidental Pulmonary Nodules Detected on CT Images: From the Fleischner Society 2017; Radiology 2017; 284:228-243. 4. Severe coronary artery calcifications. 5. Moderate aortic valve calcifications. Recommend correlation with echocardiogram to evaluate for aortic stenosis. 6. Aortic Atherosclerosis (ICD10-I70.0). Electronically Signed   By: Allegra Lai M.D.   On: 04/29/2023 18:01      Assessment and Plan: Patient Active Problem List   Diagnosis Date Noted   Multifocal pneumonia 04/05/2023   History of stroke 04/05/2023   Hyponatremia 04/05/2023   Thrombocytosis 04/05/2023   Chronic diastolic CHF (congestive heart failure) (HCC) 04/04/2023   HTN (hypertension) 04/04/2023   HLD (hyperlipidemia) 04/04/2023   Stroke (HCC) 04/04/2023   Chronic kidney disease, stage 3a (HCC) 04/04/2023   Protein-calorie malnutrition, severe (HCC) 04/04/2023   Syncope and collapse 07/28/2022   At risk for falls 07/28/2022   Anemia, unspecified 07/28/2022   Gastrointestinal hemorrhage with melena 07/28/2022   Rotator cuff arthropathy 08/05/2014    1. Pulmonary fibrosis (HCC) Will continue to monitor, oxygen stable in office. Advised to call if any problems  2. Lung nodule Will plan for repeat CT chest in ~59months for monitoring  3. Aortic valve calcification Reviewed previous echo showing no significant stenosis, continue to monitor   General Counseling: I have discussed the findings of the evaluation and examination with Diamon.  I have also discussed any further diagnostic evaluation thatmay be needed or ordered today. Diara verbalizes understanding of the findings of todays visit. We also reviewed her medications today and discussed drug interactions and side effects including but not limited excessive drowsiness and altered mental states. We also discussed that there is always a risk not just to her but  also people around her. she has been encouraged to call the  office with any questions or concerns that should arise related to todays visit.  No orders of the defined types were placed in this encounter.    Time spent: 30  I have personally obtained a history, examined the patient, evaluated laboratory and imaging results, formulated the assessment and plan and placed orders. This patient was seen by Lynn Ito, PA-C in collaboration with Dr. Freda Munro as a part of collaborative care agreement.     Yevonne Pax, MD [facility] Pulmonary and Critical Care Sleep medicine

## 2023-06-25 ENCOUNTER — Other Ambulatory Visit: Payer: Self-pay

## 2023-06-25 ENCOUNTER — Emergency Department: Payer: Medicare Other

## 2023-06-25 ENCOUNTER — Inpatient Hospital Stay
Admission: EM | Admit: 2023-06-25 | Discharge: 2023-06-27 | DRG: 871 | Disposition: A | Discharge status: Home Health | Payer: Medicare Other | Attending: Internal Medicine | Admitting: Internal Medicine

## 2023-06-25 ENCOUNTER — Encounter: Payer: Self-pay | Admitting: Family Medicine

## 2023-06-25 DIAGNOSIS — R6520 Severe sepsis without septic shock: Secondary | ICD-10-CM | POA: Diagnosis present

## 2023-06-25 DIAGNOSIS — E43 Unspecified severe protein-calorie malnutrition: Secondary | ICD-10-CM | POA: Diagnosis present

## 2023-06-25 DIAGNOSIS — A419 Sepsis, unspecified organism: Principal | ICD-10-CM | POA: Diagnosis present

## 2023-06-25 DIAGNOSIS — E7800 Pure hypercholesterolemia, unspecified: Secondary | ICD-10-CM | POA: Diagnosis present

## 2023-06-25 DIAGNOSIS — F0392 Unspecified dementia, unspecified severity, with psychotic disturbance: Secondary | ICD-10-CM | POA: Diagnosis present

## 2023-06-25 DIAGNOSIS — F05 Delirium due to known physiological condition: Secondary | ICD-10-CM | POA: Diagnosis present

## 2023-06-25 DIAGNOSIS — E119 Type 2 diabetes mellitus without complications: Secondary | ICD-10-CM | POA: Diagnosis present

## 2023-06-25 DIAGNOSIS — D75838 Other thrombocytosis: Secondary | ICD-10-CM | POA: Diagnosis present

## 2023-06-25 DIAGNOSIS — J470 Bronchiectasis with acute lower respiratory infection: Secondary | ICD-10-CM | POA: Diagnosis present

## 2023-06-25 DIAGNOSIS — J189 Pneumonia, unspecified organism: Principal | ICD-10-CM

## 2023-06-25 DIAGNOSIS — Z96651 Presence of right artificial knee joint: Secondary | ICD-10-CM | POA: Diagnosis present

## 2023-06-25 DIAGNOSIS — G9341 Metabolic encephalopathy: Secondary | ICD-10-CM | POA: Diagnosis present

## 2023-06-25 DIAGNOSIS — R64 Cachexia: Secondary | ICD-10-CM | POA: Diagnosis present

## 2023-06-25 DIAGNOSIS — Z681 Body mass index (BMI) 19 or less, adult: Secondary | ICD-10-CM

## 2023-06-25 DIAGNOSIS — E871 Hypo-osmolality and hyponatremia: Secondary | ICD-10-CM | POA: Diagnosis present

## 2023-06-25 DIAGNOSIS — J690 Pneumonitis due to inhalation of food and vomit: Secondary | ICD-10-CM | POA: Diagnosis present

## 2023-06-25 DIAGNOSIS — R443 Hallucinations, unspecified: Secondary | ICD-10-CM | POA: Diagnosis not present

## 2023-06-25 DIAGNOSIS — Z87891 Personal history of nicotine dependence: Secondary | ICD-10-CM

## 2023-06-25 DIAGNOSIS — Z66 Do not resuscitate: Secondary | ICD-10-CM | POA: Diagnosis present

## 2023-06-25 DIAGNOSIS — R4182 Altered mental status, unspecified: Secondary | ICD-10-CM | POA: Diagnosis present

## 2023-06-25 DIAGNOSIS — K219 Gastro-esophageal reflux disease without esophagitis: Secondary | ICD-10-CM | POA: Diagnosis present

## 2023-06-25 DIAGNOSIS — F03918 Unspecified dementia, unspecified severity, with other behavioral disturbance: Secondary | ICD-10-CM | POA: Diagnosis present

## 2023-06-25 DIAGNOSIS — I10 Essential (primary) hypertension: Secondary | ICD-10-CM | POA: Diagnosis present

## 2023-06-25 DIAGNOSIS — Z7982 Long term (current) use of aspirin: Secondary | ICD-10-CM

## 2023-06-25 DIAGNOSIS — G4700 Insomnia, unspecified: Secondary | ICD-10-CM | POA: Diagnosis present

## 2023-06-25 DIAGNOSIS — E878 Other disorders of electrolyte and fluid balance, not elsewhere classified: Secondary | ICD-10-CM | POA: Diagnosis present

## 2023-06-25 DIAGNOSIS — Z96642 Presence of left artificial hip joint: Secondary | ICD-10-CM | POA: Diagnosis present

## 2023-06-25 DIAGNOSIS — E785 Hyperlipidemia, unspecified: Secondary | ICD-10-CM | POA: Insufficient documentation

## 2023-06-25 DIAGNOSIS — J9601 Acute respiratory failure with hypoxia: Secondary | ICD-10-CM | POA: Diagnosis present

## 2023-06-25 DIAGNOSIS — L89151 Pressure ulcer of sacral region, stage 1: Secondary | ICD-10-CM | POA: Diagnosis present

## 2023-06-25 DIAGNOSIS — Z8249 Family history of ischemic heart disease and other diseases of the circulatory system: Secondary | ICD-10-CM

## 2023-06-25 DIAGNOSIS — Z96611 Presence of right artificial shoulder joint: Secondary | ICD-10-CM | POA: Diagnosis present

## 2023-06-25 DIAGNOSIS — L8990 Pressure ulcer of unspecified site, unspecified stage: Secondary | ICD-10-CM | POA: Insufficient documentation

## 2023-06-25 DIAGNOSIS — E861 Hypovolemia: Secondary | ICD-10-CM | POA: Diagnosis present

## 2023-06-25 DIAGNOSIS — Z8673 Personal history of transient ischemic attack (TIA), and cerebral infarction without residual deficits: Secondary | ICD-10-CM

## 2023-06-25 DIAGNOSIS — Z79899 Other long term (current) drug therapy: Secondary | ICD-10-CM

## 2023-06-25 LAB — COMPREHENSIVE METABOLIC PANEL
ALT: 9 U/L (ref 0–44)
AST: 15 U/L (ref 15–41)
Albumin: 3.1 g/dL — ABNORMAL LOW (ref 3.5–5.0)
Alkaline Phosphatase: 63 U/L (ref 38–126)
Anion gap: 13 (ref 5–15)
BUN: 25 mg/dL — ABNORMAL HIGH (ref 8–23)
CO2: 25 mmol/L (ref 22–32)
Calcium: 9.8 mg/dL (ref 8.9–10.3)
Chloride: 91 mmol/L — ABNORMAL LOW (ref 98–111)
Creatinine, Ser: 0.92 mg/dL (ref 0.44–1.00)
GFR, Estimated: 57 mL/min — ABNORMAL LOW (ref >60)
Glucose, Bld: 108 mg/dL — ABNORMAL HIGH (ref 70–99)
Potassium: 4 mmol/L (ref 3.5–5.1)
Sodium: 129 mmol/L — ABNORMAL LOW (ref 135–145)
Total Bilirubin: 0.3 mg/dL (ref 0.3–1.2)
Total Protein: 8.7 g/dL — ABNORMAL HIGH (ref 6.5–8.1)

## 2023-06-25 LAB — CBC WITH DIFFERENTIAL/PLATELET
Abs Immature Granulocytes: 0.03 10*3/uL (ref 0.00–0.07)
Basophils Absolute: 0.1 10*3/uL (ref 0.0–0.1)
Basophils Relative: 1 %
Eosinophils Absolute: 0.1 10*3/uL (ref 0.0–0.5)
Eosinophils Relative: 1 %
HCT: 35.9 % — ABNORMAL LOW (ref 36.0–46.0)
Hemoglobin: 11.2 g/dL — ABNORMAL LOW (ref 12.0–15.0)
Immature Granulocytes: 0 %
Lymphocytes Relative: 16 %
Lymphs Abs: 2 10*3/uL (ref 0.7–4.0)
MCH: 26.1 pg (ref 26.0–34.0)
MCHC: 31.2 g/dL (ref 30.0–36.0)
MCV: 83.7 fL (ref 80.0–100.0)
Monocytes Absolute: 1 10*3/uL (ref 0.1–1.0)
Monocytes Relative: 8 %
Neutro Abs: 9.6 10*3/uL — ABNORMAL HIGH (ref 1.7–7.7)
Neutrophils Relative %: 74 %
Platelets: 584 10*3/uL — ABNORMAL HIGH (ref 150–400)
RBC: 4.29 MIL/uL (ref 3.87–5.11)
RDW: 14.7 % (ref 11.5–15.5)
WBC: 12.8 10*3/uL — ABNORMAL HIGH (ref 4.0–10.5)
nRBC: 0 % (ref 0.0–0.2)

## 2023-06-25 LAB — LACTIC ACID, PLASMA
Lactic Acid, Venous: 1 mmol/L (ref 0.5–1.9)
Lactic Acid, Venous: 1.1 mmol/L (ref 0.5–1.9)

## 2023-06-25 MED ORDER — IPRATROPIUM-ALBUTEROL 0.5-2.5 (3) MG/3ML IN SOLN: 3.0000 mL | RESPIRATORY_TRACT | Status: DC | PRN

## 2023-06-25 MED ORDER — SODIUM CHLORIDE 0.9 % IV SOLN: INTRAVENOUS | Status: DC

## 2023-06-25 MED ORDER — LACTATED RINGERS IV SOLN: INTRAVENOUS | Status: DC

## 2023-06-25 MED ORDER — IPRATROPIUM-ALBUTEROL 0.5-2.5 (3) MG/3ML IN SOLN
3.0000 mL | Freq: Four times a day (QID) | RESPIRATORY_TRACT | Status: DC
  Administered 2023-06-26: 3 mL via RESPIRATORY_TRACT
  Filled 2023-06-25: qty 3

## 2023-06-25 MED ORDER — GUAIFENESIN ER 600 MG PO TB12
600.0000 mg | ORAL_TABLET | Freq: Two times a day (BID) | ORAL | Status: DC
  Administered 2023-06-25 – 2023-06-27 (×4): 600 mg via ORAL
  Filled 2023-06-25 (×4): qty 1

## 2023-06-25 MED ORDER — LACTATED RINGERS IV SOLN
150.0000 mL/h | INTRAVENOUS | Status: DC
  Administered 2023-06-25: 150 mL/h via INTRAVENOUS

## 2023-06-25 MED ORDER — HYDROCOD POLI-CHLORPHE POLI ER 10-8 MG/5ML PO SUER: 5.0000 mL | Freq: Two times a day (BID) | ORAL | Status: DC | PRN

## 2023-06-25 MED ORDER — ACETAMINOPHEN 325 MG PO TABS: 650.0000 mg | ORAL_TABLET | Freq: Four times a day (QID) | ORAL | Status: DC | PRN

## 2023-06-25 MED ORDER — MAGNESIUM HYDROXIDE 400 MG/5ML PO SUSP: 30.0000 mL | Freq: Every day | ORAL | Status: DC | PRN

## 2023-06-25 MED ORDER — SODIUM CHLORIDE 0.9 % IV SOLN
500.0000 mg | INTRAVENOUS | Status: DC
  Administered 2023-06-25: 500 mg via INTRAVENOUS
  Filled 2023-06-25 (×2): qty 5

## 2023-06-25 MED ORDER — SODIUM CHLORIDE 0.9 % IV SOLN
2.0000 g | INTRAVENOUS | Status: DC
  Administered 2023-06-25 – 2023-06-26 (×2): 2 g via INTRAVENOUS
  Filled 2023-06-25 (×2): qty 20

## 2023-06-25 MED ORDER — ONDANSETRON HCL 4 MG PO TABS: 4.0000 mg | ORAL_TABLET | Freq: Four times a day (QID) | ORAL | Status: DC | PRN

## 2023-06-25 MED ORDER — ENOXAPARIN SODIUM 30 MG/0.3ML IJ SOSY
30.0000 mg | PREFILLED_SYRINGE | INTRAMUSCULAR | Status: DC
  Administered 2023-06-25 – 2023-06-26 (×2): 30 mg via SUBCUTANEOUS
  Filled 2023-06-25 (×2): qty 0.3

## 2023-06-25 MED ORDER — SODIUM CHLORIDE 0.9 % IV SOLN: 500.0000 mg | INTRAVENOUS | Status: DC

## 2023-06-25 MED ORDER — SODIUM CHLORIDE 0.9 % IV SOLN: 2.0000 g | INTRAVENOUS | Status: DC

## 2023-06-25 MED ORDER — ONDANSETRON HCL 4 MG/2ML IJ SOLN: 4.0000 mg | Freq: Four times a day (QID) | INTRAMUSCULAR | Status: DC | PRN

## 2023-06-25 MED ORDER — LACTATED RINGERS IV BOLUS (SEPSIS)
1000.0000 mL | Freq: Once | INTRAVENOUS | Status: AC
  Administered 2023-06-25: 1000 mL via INTRAVENOUS

## 2023-06-25 MED ORDER — ACETAMINOPHEN 650 MG RE SUPP: 650.0000 mg | Freq: Four times a day (QID) | RECTAL | Status: DC | PRN

## 2023-06-25 MED ORDER — TRAZODONE HCL 50 MG PO TABS: 25.0000 mg | ORAL_TABLET | Freq: Every evening | ORAL | Status: DC | PRN

## 2023-06-25 MED ORDER — LACTATED RINGERS IV BOLUS (SEPSIS)
250.0000 mL | Freq: Once | INTRAVENOUS | Status: AC
  Administered 2023-06-25: 250 mL via INTRAVENOUS

## 2023-06-25 NOTE — ED Provider Notes (Signed)
[facility] Provider Note    Event Date/Time   First MD Initiated Contact with Patient 06/25/23 1719     (approximate)   History   Hallucinations   HPI  Debbie Foster is a 89 y.o. female who presents to the emergency department today because of concerns for hallucinations.  Some history is also obtained from bedside companions.  Apparently the patient does have a history of hallucinations.  Over the past few days however they have become more frequent.  The patient herself denies any fevers or pain.  Denies any shortness of breath.  Per chart review the patient was seen a couple months ago for pneumonia.     Physical Exam   Triage Vital Signs: ED Triage Vitals  Encounter Vitals Group     BP 06/25/23 1715 (!) 152/60     Systolic BP Percentile --      Diastolic BP Percentile --      Pulse Rate 06/25/23 1712 76     Resp 06/25/23 1712 (!) 26     Temp 06/25/23 1712 100 F (37.8 C)     Temp Source 06/25/23 1712 Oral     SpO2 06/25/23 1712 100 %     Weight --      Height --      Head Circumference --      Peak Flow --      Pain Score --      Pain Loc --      Pain Education --      Exclude from Growth Chart --     Most recent vital signs: Vitals:   06/25/23 1712 06/25/23 1715  BP:  (!) 152/60  Pulse: 76   Resp: (!) 26   Temp: 100 F (37.8 C)   SpO2: 100%     General: Awake, alert, not completely oriented. CV:  Good peripheral perfusion. Regular rate and rhythm. Resp:  Slightly increased work of breathing. Tachypnea. Abd:  No distention. Non tender. Other:  Does not appear to be responding to internal stimuli on my exam   ED Results / Procedures / Treatments   Labs (all labs ordered are listed, but only abnormal results are displayed) Labs Reviewed  COMPREHENSIVE METABOLIC PANEL - Abnormal; Notable for the following components:      Result Value   Sodium 129 (*)    Chloride 91 (*)    Glucose, Bld 108 (*)    BUN 25 (*)    Total  Protein 8.7 (*)    Albumin 3.1 (*)    GFR, Estimated 57 (*)    All other components within normal limits  CBC WITH DIFFERENTIAL/PLATELET - Abnormal; Notable for the following components:   WBC 12.8 (*)    Hemoglobin 11.2 (*)    HCT 35.9 (*)    Platelets 584 (*)    Neutro Abs 9.6 (*)    All other components within normal limits  CULTURE, BLOOD (ROUTINE X 2)  CULTURE, BLOOD (ROUTINE X 2)  LACTIC ACID, PLASMA  CBC  URINALYSIS, W/ REFLEX TO CULTURE (INFECTION SUSPECTED)  LACTIC ACID, PLASMA     EKG  I, Phineas Semen, attending physician, personally viewed and interpreted this EKG  EKG Time: 1713 Rate: 95 Rhythm: sinus rhythm Axis: normal Intervals: qtc 423 QRS: narrow, q waves v1 ST changes: no st elevation Impression: abnormal ekg   RADIOLOGY I independently interpreted and visualized the CT head. My interpretation: no bleed Radiology interpretation:  IMPRESSION:  1. No  CT evidence for acute intracranial abnormality.  2. Atrophy and chronic small vessel ischemic changes of the white  matter.    I independently interpreted and visualized the CXR. My interpretation: diffuse airspace opacities Radiology interpretation:  IMPRESSION:  Underlying chronic lung disease with bronchiectasis and fibrosis.  Patchy bilateral airspace opacities concerning for acute  superimposed airspace disease/pneumonia.      PROCEDURES:  Critical Care performed: Yes  CRITICAL CARE Performed by: Phineas Semen   Total critical care time: 30 minutes  Critical care time was exclusive of separately billable procedures and treating other patients.  Critical care was necessary to treat or prevent imminent or life-threatening deterioration.  Critical care was time spent personally by me on the following activities: development of treatment plan with patient and/or surrogate as well as nursing, discussions with consultants, evaluation of patient's response to treatment, examination of  patient, obtaining history from patient or surrogate, ordering and performing treatments and interventions, ordering and review of laboratory studies, ordering and review of radiographic studies, pulse oximetry and re-evaluation of patient's condition.   Procedures    MEDICATIONS ORDERED IN ED: Medications - No data to display   IMPRESSION / MDM / ASSESSMENT AND PLAN / ED COURSE  I reviewed the triage vital signs and the nursing notes.                              Differential diagnosis includes, but is not limited to, intracranial process, infection, electrolyte abnormality, medication side effect  Patient's presentation is most consistent with acute presentation with potential threat to life or bodily function.   The patient is on the cardiac monitor to evaluate for evidence of arrhythmia and/or significant heart rate changes.  Patient presented to the emergency department today because of concerns for increased hallucinations.  Patient did have a temperature of 100 here.  Was also found to be tachypneic.  Blood work with slight leukocytosis.  Sodium and chloride were decreased concerning for possible dehydration.  Chest x-ray is concerning for pneumonia.  At this time I do have concern that infection is driving the patient's altered mental status.  Will start patient on broad-spectrum antibiotics.  Patient patient was written for IV fluids.  Discussed with Dr. Arville Care with the hospitalist service who will evaluate for admission.      FINAL CLINICAL IMPRESSION(S) / ED DIAGNOSES   Final diagnoses:  Pneumonia due to infectious organism, unspecified laterality, unspecified part of lung  Hallucinations      Note:  This document was prepared using Dragon voice recognition software and may include unintentional dictation errors.    Phineas Semen, MD 06/25/23 2027

## 2023-06-25 NOTE — Assessment & Plan Note (Signed)
-  We will place the patient on supplemental coverage with NovoLog.

## 2023-06-25 NOTE — Consult Note (Signed)
CODE SEPSIS - PHARMACY COMMUNICATION  **Broad Spectrum Antibiotics should be administered within 1 hour of Sepsis diagnosis**  Time Code Sepsis Called/Page Received: 1825  Antibiotics Ordered: ceftriaxone, azithromycin  Time of 1st antibiotic administration: 1840  Additional action taken by pharmacy: N/A  Celene Squibb, PharmD Clinical Pharmacist 06/25/2023 6:36 PM

## 2023-06-25 NOTE — Consult Note (Signed)
PHARMACIST - PHYSICIAN COMMUNICATION  CONCERNING:  Enoxaparin (Lovenox) for DVT Prophylaxis    RECOMMENDATION: Patient was prescribed enoxaprin 40mg  q24 hours for VTE prophylaxis.   Filed Weights   06/25/23 1840  Weight: 39.5 kg (87 lb)    Body mass index is 16.44 kg/m.  Estimated Creatinine Clearance: 22.3 mL/min (by C-G formula based on SCr of 0.92 mg/dL).  Patient is candidate for enoxaparin 30mg  every 24 hours based on CrCl <51ml/min and Weight <45kg  DESCRIPTION: Pharmacy has adjusted enoxaparin dose per [facility] policy.  Patient is now receiving enoxaparin 30 mg every 24 hours   Celene Squibb, PharmD Clinical Pharmacist 06/25/2023 8:03 PM

## 2023-06-25 NOTE — Sepsis Progress Note (Signed)
Elink following for sepsis protocol. 

## 2023-06-25 NOTE — H&P (Signed)
[place]   PATIENT NAME: Debbie Foster    MR#:  [id number]  DATE OF BIRTH:  03/27/1925  DATE OF ADMISSION:  06/25/2023  PRIMARY CARE PHYSICIAN: [facility]   Patient is coming from: Home  REQUESTING/REFERRING PHYSICIAN: Phineas Semen, MD  CHIEF COMPLAINT:   Chief Complaint  Patient presents with   Hallucinations    HISTORY OF PRESENT ILLNESS:  Debbie Foster is a 89 y.o. Caucasian female with medical history significant for osteoarthritis, hypertension dyslipidemia, GERD, migraine and CVA, who presented to the emergency room with acute onset of altered mental status with visual hallucinations.  The patient stated that she has been seeing people who are not there.  She admits to mild cough which has been nonproductive as well as mild dyspnea without wheezing.  No fever or chills.  No nausea or vomiting or abdominal pain.  No chest pain or palpitations.  No dysuria, oliguria or hematuria or flank pain.  ED Course: When the patient came to the ER, temperature was 100 and BP 152/60 with respiratory rate of 26 with otherwise normal pulse oximetry.  Labs revealed mild hyponatremia and mild hypochloremia, BUN 25 and albumin 3.1 and total protein 8.7.  Lactic acid was 1 and later 1.1.  CBC showed leukocytosis of 12.8 with neutrophilia and thrombocytosis. EKG as reviewed by me : EKG showed normal sinus rhythm with a rate of 95 with probable LVH and secondary repolarization abnormality. Imaging: Portable chest x-ray showed underlying chronic lung disease with bronchiectasis and fibrosis as well as patchy bilateral airspace opacities concerning for acute superimposed airspace disease/pneumonia.  Noncontrasted head CT scan revealed no acute intracranial abnormalities.  It showed atrophy and chronic small vessel ischemic change of the white matter.  The patient was given IV Rocephin and Zithromax.  She will be admitted to a medical telemetry bed for further evaluation and  management. PAST MEDICAL HISTORY:   Past Medical History:  Diagnosis Date   Arthritis    "just about q bone in my body"   Blood dyscrasia    Bronchitis 11/2013   "first time I've ever had it; still coughing from it now" (08/05/2014)   Diverticulitis 2004   Double vision 2012   "left eye; from the eye stroke; can see fine w/my glasses"   GERD (gastroesophageal reflux disease)    h/o uses OTC   Heart murmur    High cholesterol    Hypertension    Migraine    "stopped w/the change of life; age 22"   Squamous carcinoma    arms and face   Stroke (HCC)    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    PAST SURGICAL HISTORY:   Past Surgical History:  Procedure Laterality Date   CARPAL TUNNEL RELEASE Right    CATARACT EXTRACTION W/ INTRAOCULAR LENS IMPLANT Right 2003   CATARACT EXTRACTION W/ INTRAOCULAR LENS IMPLANT Left 2006   JOINT REPLACEMENT     REVERSE SHOULDER ARTHROPLASTY Right 08/05/2014   REVERSE SHOULDER ARTHROPLASTY Right 08/05/2014   Procedure: REVERSE SHOULDER ARTHROPLASTY;  Surgeon: Mable Paris, MD;  Location: [facility] OR;  Service: Orthopedics;  Laterality: Right;  Right reverse total shoulder arthroplasty   TONSILLECTOMY     TOTAL HIP ARTHROPLASTY Left 1998   TOTAL KNEE ARTHROPLASTY Right 2005    SOCIAL HISTORY:   Social History   Tobacco Use   Smoking status: Former    Types: Cigarettes   Smokeless tobacco: Never   Tobacco comments:    "  smoked less than 1 pack of cigarettes in my life; didn't like it"  Substance Use Topics   Alcohol use: No    FAMILY HISTORY:   Family History  Problem Relation Age of Onset   Heart attack Mother    Heart attack Brother     DRUG ALLERGIES:  No Known Allergies  REVIEW OF SYSTEMS:   ROS As per history of present illness. All pertinent systems were reviewed above. Constitutional, HEENT, cardiovascular, respiratory, GI, GU, musculoskeletal, neuro, psychiatric, endocrine, integumentary and hematologic systems were reviewed  and are otherwise negative/unremarkable except for positive findings mentioned above in the HPI.   MEDICATIONS AT HOME:   Prior to Admission medications   Medication Sig Start Date End Date Taking? Authorizing Provider  aspirin 81 MG tablet Take 81 mg by mouth daily.    [provider]  benzonatate (TESSALON) 100 MG capsule Take 100 mg by mouth as needed for cough.    [provider]  brimonidine-timolol (COMBIGAN) 0.2-0.5 % ophthalmic solution Place 1 drop into both eyes every 12 (twelve) hours.    [provider]  Cholecalciferol (VITAMIN D3) 1000 UNITS CAPS Take 1,000 Units by mouth daily.    [provider]  Coenzyme Q10 100 MG capsule Take 100 mg by mouth daily.    [provider]  feeding supplement (ENSURE ENLIVE / ENSURE PLUS) LIQD Take 237 mLs by mouth 2 (two) times daily between meals. 04/06/23   Alford Highland, MD  metoprolol succinate (TOPROL-XL) 50 MG 24 hr tablet Take 25 mg by mouth daily. Take half a tablet by mouth daily    [provider]  nitroGLYCERIN (NITROSTAT) 0.4 MG SL tablet Place 0.4 mg under the tongue every 5 (five) minutes as needed.  03/02/16   [provider]  Olmesartan-amLODIPine-HCTZ 40-10-12.5 MG TABS Take 1 tablet by mouth once daily. daily 04/19/21   [provider]  prednisoLONE acetate (PRED FORTE) 1 % ophthalmic suspension Place 1 drop into the right eye every morning. 08/11/22   [provider]  trolamine salicylate (ASPERCREME) 10 % cream Apply 1 application topically as needed for muscle pain.    [provider]      VITAL SIGNS:  Blood pressure (!) 129/57, pulse 71, temperature (!) 97.3 F (36.3 C), resp. rate 20, height 5\' 3"  (1.6 m), weight 38.4 kg, SpO2 98%.  PHYSICAL EXAMINATION:  Physical Exam  GENERAL:  89 y.o.-year-old cachectic Caucasian female patient lying in the bed with no acute distress.  EYES: Pupils equal, round, reactive to light and  accommodation. No scleral icterus. Extraocular muscles intact.  HEENT: Head atraumatic, normocephalic. Oropharynx and nasopharynx clear.  NECK:  Supple, no jugular venous distention. No thyroid enlargement, no tenderness.  LUNGS: Diminished bibasilar breath sounds with bibasal crackles.  No use of accessory muscles of respiration.  CARDIOVASCULAR: Regular rate and rhythm, S1, S2 normal. No murmurs, rubs, or gallops.  ABDOMEN: Soft, nondistended, nontender. Bowel sounds present. No organomegaly or mass.  EXTREMITIES: No pedal edema, cyanosis, or clubbing.  NEUROLOGIC: Cranial nerves II through XII are intact. Muscle strength 5/5 in all extremities. Sensation intact. Gait not checked.  PSYCHIATRIC: The patient is alert and oriented x 3.  Normal affect and good eye contact. SKIN: No obvious rash, lesion, or ulcer.   LABORATORY PANEL:   CBC Recent Labs  Lab 06/25/23 1726  WBC 12.8*  HGB 11.2*  HCT 35.9*  PLT 584*   ------------------------------------------------------------------------------------------------------------------  Chemistries  Recent Labs  Lab 06/25/23 1726  NA 129*  K 4.0  CL 91*  CO2 25  GLUCOSE 108*  BUN 25*  CREATININE 0.92  CALCIUM 9.8  AST 15  ALT 9  ALKPHOS 63  BILITOT 0.3   ------------------------------------------------------------------------------------------------------------------  Cardiac Enzymes No results for input(s): "TROPONINI" in the last 168 hours. ------------------------------------------------------------------------------------------------------------------  RADIOLOGY:  CT Head Wo Contrast  Result Date: 06/25/2023 CLINICAL DATA:  Altered mental status EXAM: CT HEAD WITHOUT CONTRAST TECHNIQUE: Contiguous axial images were obtained from the base of the skull through the vertex without intravenous contrast. RADIATION DOSE REDUCTION: This exam was performed according to the departmental dose-optimization program which includes  automated exposure control, adjustment of the mA and/or kV according to patient size and/or use of iterative reconstruction technique. COMPARISON:  CT brain 07/27/2022 FINDINGS: Brain: No acute territorial infarction, hemorrhage or intracranial mass. Mild atrophy. Patchy white matter hypodensity consistent with chronic small vessel ischemic change. Stable ventricle size Vascular: No hyperdense vessels. Vertebral and carotid vascular calcification Skull: Normal. Negative for fracture or focal lesion. Sinuses/Orbits: No acute finding. Other: None IMPRESSION: 1. No CT evidence for acute intracranial abnormality. 2. Atrophy and chronic small vessel ischemic changes of the white matter. Electronically Signed   By: Jasmine Pang M.D.   On: 06/25/2023 18:08   DG Chest Port 1 View  Result Date: 06/25/2023 CLINICAL DATA:  Possible sepsis EXAM: PORTABLE CHEST 1 VIEW COMPARISON:  04/04/2023, chest CT 04/25/2023 FINDINGS: Underlying chronic lung disease with bronchiectasis and fibrosis. Patchy bilateral airspace opacities concerning for acute superimposed airspace disease. Stable cardiomediastinal silhouette. No pneumothorax. Incompletely visualized right shoulder replacement. IMPRESSION: Underlying chronic lung disease with bronchiectasis and fibrosis. Patchy bilateral airspace opacities concerning for acute superimposed airspace disease/pneumonia. Electronically Signed   By: Jasmine Pang M.D.   On: 06/25/2023 17:42      IMPRESSION AND PLAN:  Assessment and Plan: * Sepsis due to pneumonia [facility]) - The patient will be admitted to a medical telemetry bed. -Sepsis manifested by leukocytosis and tachypnea. - This is likely the culprit for her metabolic encephalopathy and visual hallucinations. - Will continue antibiotic therapy with IV Rocephin and Zithromax. - Mucolytic therapy be provided as well as duo nebs q.i.d. and q.4 hours p.r.n. - We will follow blood cultures.   Acute respiratory failure with hypoxia  (HCC) - This is clearly secondary to #1. - O2 protocol will be followed. - Management otherwise as above.  Hyponatremia - This is likely hypovolemic. - The patient will be hydrated with IV normal saline. - We will follow BMP. - We will hold off diuretic therapy.  Dyslipidemia - We will continue statin therapy.  Controlled type 2 diabetes mellitus without complication, without long-term current use of insulin (HCC) - We will place the patient on supplemental coverage with NovoLog.  Essential hypertension - We will continue her antihypertensives.       DVT prophylaxis: Lovenox.  Advanced Care Planning:  Code Status: DNR and DNI. Family Communication:  The plan of care was discussed in details with the patient (and family). I answered all questions. The patient agreed to proceed with the above mentioned plan. Further management will depend upon hospital course. Disposition Plan: Back to previous home environment Consults called: none.  All the records are reviewed and case discussed with ED provider.  Status is: Inpatient   At the time of the admission, it appears that the appropriate admission status for this patient is inpatient.  This is judged to be reasonable and necessary in order to provide the required  intensity of service to ensure the patient's safety given the presenting symptoms, physical exam findings and initial radiographic and laboratory data in the context of comorbid conditions.  The patient requires inpatient status due to high intensity of service, high risk of further deterioration and high frequency of surveillance required.  I certify that at the time of admission, it is my clinical judgment that the patient will require inpatient hospital care extending more than 2 midnights.                            Dispo: The patient is from: Home              Anticipated d/c is to: Home              Patient currently is not medically stable to d/c.               Difficult to place patient: No  Hannah Beat M.D on 06/25/2023 at 11:27 PM  [facility]   From 7 PM-7 AM, contact night-coverage [email or web address]  CC: Primary care physician; [facility]

## 2023-06-25 NOTE — Assessment & Plan Note (Signed)
-   This is clearly secondary to #1. - O2 protocol will be followed. - Management otherwise as above. 

## 2023-06-25 NOTE — Assessment & Plan Note (Signed)
-   We will continue statin therapy. 

## 2023-06-25 NOTE — Assessment & Plan Note (Addendum)
-   This is likely hypovolemic. - The patient will be hydrated with IV normal saline. - We will follow BMP. - We will hold off diuretic therapy.

## 2023-06-25 NOTE — Assessment & Plan Note (Addendum)
-   The patient will be admitted to a medical telemetry bed. -Sepsis manifested by leukocytosis and tachypnea. - This is likely the culprit for her metabolic encephalopathy and visual hallucinations. - Will continue antibiotic therapy with IV Rocephin and Zithromax. - Mucolytic therapy be provided as well as duo nebs q.i.d. and q.4 hours p.r.n. - We will follow blood cultures.

## 2023-06-25 NOTE — Assessment & Plan Note (Signed)
-   We will continue her antihypertensives. 

## 2023-06-25 NOTE — ED Triage Notes (Signed)
BIBEMS, coming from home. C/o intermittent hallucinations x3 days. VSS. BGL: 130. GCS 14. DNR @bedside 

## 2023-06-26 ENCOUNTER — Encounter: Payer: Self-pay | Admitting: Family Medicine

## 2023-06-26 DIAGNOSIS — A419 Sepsis, unspecified organism: Secondary | ICD-10-CM | POA: Diagnosis not present

## 2023-06-26 DIAGNOSIS — J189 Pneumonia, unspecified organism: Secondary | ICD-10-CM | POA: Diagnosis not present

## 2023-06-26 DIAGNOSIS — G9341 Metabolic encephalopathy: Secondary | ICD-10-CM | POA: Insufficient documentation

## 2023-06-26 DIAGNOSIS — J9601 Acute respiratory failure with hypoxia: Secondary | ICD-10-CM | POA: Diagnosis not present

## 2023-06-26 DIAGNOSIS — F0392 Unspecified dementia, unspecified severity, with psychotic disturbance: Secondary | ICD-10-CM

## 2023-06-26 LAB — URINALYSIS, W/ REFLEX TO CULTURE (INFECTION SUSPECTED)
Bacteria, UA: NONE SEEN
Bilirubin Urine: NEGATIVE
Glucose, UA: NEGATIVE mg/dL
Hgb urine dipstick: NEGATIVE
Ketones, ur: NEGATIVE mg/dL
Leukocytes,Ua: NEGATIVE
Nitrite: NEGATIVE
Protein, ur: 30 mg/dL — AB
Specific Gravity, Urine: 1.012 (ref 1.005–1.030)
pH: 6 (ref 5.0–8.0)

## 2023-06-26 MED ORDER — QUETIAPINE FUMARATE 25 MG PO TABS
25.0000 mg | ORAL_TABLET | Freq: Every day | ORAL | Status: DC
  Administered 2023-06-26: 25 mg via ORAL
  Filled 2023-06-26: qty 1

## 2023-06-26 MED ORDER — LORAZEPAM 2 MG/ML IJ SOLN
0.5000 mg | INTRAMUSCULAR | Status: DC | PRN
  Administered 2023-06-26: 0.5 mg via INTRAVENOUS
  Filled 2023-06-26: qty 1

## 2023-06-26 MED ORDER — GLUCERNA SHAKE PO LIQD
237.0000 mL | Freq: Two times a day (BID) | ORAL | Status: DC
  Administered 2023-06-26 – 2023-06-27 (×3): 237 mL via ORAL

## 2023-06-26 MED ORDER — HALOPERIDOL LACTATE 5 MG/ML IJ SOLN: 1.0000 mg | Freq: Four times a day (QID) | INTRAMUSCULAR | Status: DC | PRN

## 2023-06-26 NOTE — TOC Initial Note (Signed)
Transition of Care [facility]) - Initial/Assessment Note    Patient Details  Name: NONIE TYRRELL MRN: [id number] Date of Birth: [birth year]  Transition of Care [facility]) CM/SW Contact:    Garret Reddish, RN Phone Number: 06/26/2023, 12:13 PM  Clinical Narrative:     Chart reviewed.  Noted that patient was admitted with  Sepsis due to pneumonia [facility]) Bilateral lower lobe aspiration pneumonia. Acute respiratory failure with hypoxia.  She is currently on IV Rocephin.  Noted that patient has a hx of Senile dementia.    I have spoken with patient's daughter Jimmye Norman.  Mrs. Jeannette Corpus informs me that prior to admission patient lived at home with her.  She informs me that prior to admission patient was able to bath herself and she would assist her with dressing.  She reports that prior to admission in the hospital patient had become very weak and needed assistance with bathing and dressing. Patient was able to get around her the home with a 2 wheeled rolling walker.  Mrs.  Jeannette Corpus took patient to appointments.   Mrs. Jeannette Corpus reports that patient uses [facility] for medications that are needed right away and [facility] for her maintenance medications.    Mrs. Jeannette Corpus reports that patient was active with [facility] prior to admission for home health nursing for wound care to her buttocks.    I have spoken with Mrs. Jeannette Corpus about discharge planning.  Mrs. Jeannette Corpus reports that her mother will be coming home on discharge and she would like to receive home PT and OT on discharge.    TOC will continue to follow for discharge planning.      Expected Discharge Plan: Home w Home Health Services Barriers to Discharge: No Barriers Identified   Patient Goals and CMS Choice       [place] ownership interest in [facility].provided to:: Adult Children    Expected Discharge Plan and Services     Post Acute Care Choice: Home Health Living arrangements for the past 2 months: Single Family Home                            HH Arranged:  (Patient is active with [facility] for home health RN for wound care.) [facility] Agency: [employer] [facility] Services Date [facility] Agency Contacted: 06/26/23   Representative spoke with at [facility] Agency: Elnita Maxwell  Prior Living Arrangements/Services Living arrangements for the past 2 months: Single Family Home Lives with:: Adult Children Patient language and need for interpreter reviewed:: Yes            Current home services: Home RN (Patient has a 2 wheeled rolling walker at home, rollator, and BSC at home.)    Activities of Daily Living Home Assistive Devices/Equipment: Eyeglasses, Hearing aid, Wheelchair, Environmental consultant (specify type) ADL Screening (condition at time of admission) Patient's cognitive ability adequate to safely complete daily activities?: Yes Is the patient deaf or have difficulty hearing?: Yes Does the patient have difficulty seeing, even when wearing glasses/contacts?: Yes Does the patient have difficulty concentrating, remembering, or making decisions?: Yes Patient able to express need for assistance with ADLs?: Yes Does the patient have difficulty dressing or bathing?: Yes Independently performs ADLs?: No Communication: Independent Dressing (OT): Needs assistance Is this a change from baseline?: Pre-admission baseline Grooming: Needs assistance Is this a change from baseline?: Pre-admission baseline Feeding: Independent Bathing: Needs assistance Is this a change from baseline?: Pre-admission baseline Toileting: Needs assistance Is  this a change from baseline?: Pre-admission baseline In/Out Bed: Needs assistance Is this a change from baseline?: Pre-admission baseline Walks in Home: Needs assistance Is this a change from baseline?: Pre-admission baseline Does the patient have difficulty walking or climbing stairs?: Yes Weakness of Legs: Both Weakness of Arms/Hands: Both  Permission Sought/Granted                   Emotional Assessment              Admission diagnosis:  Sepsis due to pneumonia (HCC) [J18.9, A41.9] Patient Active Problem List   Diagnosis Date Noted   Acute metabolic encephalopathy 06/26/2023   Senile dementia with delirium (HCC) 06/26/2023   Sepsis due to pneumonia (HCC) 06/25/2023   Acute respiratory failure with hypoxia (HCC) 06/25/2023   Pressure injury of skin 06/25/2023   Essential hypertension 06/25/2023   Controlled type 2 diabetes mellitus without complication, without long-term current use of insulin (HCC) 06/25/2023   Dyslipidemia 06/25/2023   Hallucinations 06/25/2023   Multifocal pneumonia 04/05/2023   History of stroke 04/05/2023   Hyponatremia 04/05/2023   Thrombocytosis 04/05/2023   Chronic diastolic CHF (congestive heart failure) (HCC) 04/04/2023   HTN (hypertension) 04/04/2023   HLD (hyperlipidemia) 04/04/2023   Stroke (HCC) 04/04/2023   Chronic kidney disease, stage 3a (HCC) 04/04/2023   Protein-calorie malnutrition, severe (HCC) 04/04/2023   Syncope and collapse 07/28/2022   At risk for falls 07/28/2022   Anemia, unspecified 07/28/2022   Gastrointestinal hemorrhage with melena 07/28/2022   Rotator cuff arthropathy 08/05/2014   PCP:  [facility] Pharmacy:   [facility] - [place], [place] - [address] [place] [address] Phone: [phone] Fax: [phone]  [id number] [place], [place] - [address] AT [facility] [address] [place] [address] Phone: [phone] Fax: [phone]     Social Determinants of Health (SDOH) Social History: SDOH Screenings   Food Insecurity: No Food Insecurity (06/25/2023)  Housing: Low Risk  (06/25/2023)  Transportation Needs: No Transportation Needs (06/25/2023)  Utilities: Not At Risk (06/25/2023)  Tobacco Use: Medium Risk (05/12/2023)   SDOH Interventions:     Readmission Risk Interventions     No data to display

## 2023-06-26 NOTE — Plan of Care (Signed)
Patient is alert X 3, VSS, denies any c/o pain. Patient diagnosed with Sepsis d/t  PNA, treatment orders are IV antibiotics and breathing treatments. Patient has non-productive cough with dark colored sputum noted. IV fluids discontinued, patient is on Tele running Sinus Huston Foley. Patient arrived to unit with Stg 1 pressure injury to sacrum, foam dressing applied. POC is PT/OT, IV antibiotics and breathing treatments.  Problem: Fluid Volume: Goal: Hemodynamic stability will improve Outcome: Not Progressing   Problem: Clinical Measurements: Goal: Diagnostic test results will improve Outcome: Not Progressing Goal: Signs and symptoms of infection will decrease Outcome: Not Progressing   Problem: Respiratory: Goal: Ability to maintain adequate ventilation will improve Outcome: Not Progressing   Problem: Education: Goal: Knowledge of General Education information will improve Description: Including pain rating scale, medication(s)/side effects and non-pharmacologic comfort measures Outcome: Not Progressing   Problem: Health Behavior/Discharge Planning: Goal: Ability to manage health-related needs will improve Outcome: Not Progressing   Problem: Clinical Measurements: Goal: Ability to maintain clinical measurements within normal limits will improve Outcome: Not Progressing Goal: Will remain free from infection Outcome: Not Progressing Goal: Diagnostic test results will improve Outcome: Not Progressing Goal: Respiratory complications will improve Outcome: Not Progressing Goal: Cardiovascular complication will be avoided Outcome: Not Progressing   Problem: Activity: Goal: Risk for activity intolerance will decrease Outcome: Not Progressing   Problem: Nutrition: Goal: Adequate nutrition will be maintained Outcome: Not Progressing   Problem: Coping: Goal: Level of anxiety will decrease Outcome: Not Progressing   Problem: Elimination: Goal: Will not experience complications related  to bowel motility Outcome: Not Progressing Goal: Will not experience complications related to urinary retention Outcome: Not Progressing   Problem: Pain Managment: Goal: General experience of comfort will improve Outcome: Not Progressing   Problem: Safety: Goal: Ability to remain free from injury will improve Outcome: Not Progressing   Problem: Skin Integrity: Goal: Risk for impaired skin integrity will decrease Outcome: Not Progressing

## 2023-06-26 NOTE — Evaluation (Signed)
Physical Therapy Evaluation Patient Details Name: Debbie Foster MRN: [id number] DOB: Dec 06, 1924 Today's Date: 06/26/2023  History of Present Illness  Pt is a 89 y.o. female presenting to hospital 06/25/23 with c/o increased hallucinations and AMS.  Pt admitted with sepsis d/t PNA, acute respiratory failure with hypoxia, and hyponatremia.  PMH includes OA, htn, dyslipidemia, migraine, CVA, R carpal tunnel release, R reverse shoulder arthroplasty, L THA, R TKA.  Clinical Impression  Prior to recent medical concerns, pt was SBA with household (and limited community) ambulation distances with RW; lives with her daughter who provides 24/7 supervision/assist as needed.  Pt appearing very tired during session (anticipate medication related).  Currently pt is min assist with bed mobility; CGA with transfers using RW; and CGA to take a few steps with RW use (limited d/t pt fatigue).  Generalized weakness and decreased activity tolerance from baseline noted.  Pt's daughter requesting pt discharge home instead of SNF.  Pt would currently benefit from skilled PT to address noted impairments and functional limitations (see below for any additional details).  Upon hospital discharge, pt would benefit from ongoing therapy.     If plan is discharge home, recommend the following: A little help with walking and/or transfers;A little help with bathing/dressing/bathroom;Assistance with cooking/housework;Direct supervision/assist for medications management;Direct supervision/assist for financial management;Assist for transportation;Help with stairs or ramp for entrance   Can travel by private vehicle        Equipment Recommendations Rolling walker (2 wheels);BSC/3in1;Wheelchair (measurements PT);Wheelchair cushion (measurements PT)  Recommendations for Other Services       Functional Status Assessment Patient has had a recent decline in their functional status and demonstrates the ability to make significant  improvements in function in a reasonable and predictable amount of time.     Precautions / Restrictions Precautions Precautions: Fall Restrictions Weight Bearing Restrictions: No      Mobility  Bed Mobility Overal bed mobility: Needs Assistance Bed Mobility: Supine to Sit, Sit to Supine     Supine to sit: Min assist Sit to supine: Min assist   General bed mobility comments: assist for trunk/B LE's    Transfers Overall transfer level: Needs assistance Equipment used: Rolling walker (2 wheels) Transfers: Sit to/from Stand Sit to Stand: Min guard           General transfer comment: fairly strong stand up to RW; vc's for UE placement    Ambulation/Gait Ambulation/Gait assistance: Min guard Gait Distance (Feet):  (pt took a few steps in place with B UE support on RW) Assistive device: Rolling walker (2 wheels)   Gait velocity: decreased     General Gait Details: decreased B LE foot clearance; increased effort to take steps  Stairs            Wheelchair Mobility     Tilt Bed    Modified Rankin (Stroke Patients Only)       Balance Overall balance assessment: Needs assistance Sitting-balance support: No upper extremity supported, Feet supported Sitting balance-Leahy Scale: Good Sitting balance - Comments: steady reaching within BOS   Standing balance support: Bilateral upper extremity supported, Reliant on assistive device for balance Standing balance-Leahy Scale: Fair Standing balance comment: steady static standing with B UE support on RW                             Pertinent Vitals/Pain Pain Assessment Pain Assessment: Faces Faces Pain Scale: Hurts a little bit Pain Location: back Pain  Descriptors / Indicators: Sore Pain Intervention(s): Limited activity within patient's tolerance, Monitored during session, Repositioned Vitals (HR and SpO2 on 2 L via nasal cannula) stable and WFL throughout treatment session.    Home Living  Family/patient expects to be discharged to:: Private residence Living Arrangements: Children (Pt's daughter) Available Help at Discharge: Family;Available 24 hours/day Type of Home: House Home Access: Stairs to enter Entrance Stairs-Rails: None Entrance Stairs-Number of Steps: 1+1   Home Layout: One level Home Equipment: Agricultural consultant (2 wheels);Shower seat;Cane - single point;Rollator (4 wheels);Grab bars - tub/shower;Grab bars - toilet Additional Comments: Pt's daughter provides 24/7 supervision/assist (lives with pt)    Prior Function Prior Level of Function : Needs assist             Mobility Comments: Pt ambulatory household distances (and limited community distances) with RW--daughter provdes SBA.  Mostly only leaves house for MD appts but able to walk from parking lot into office with RW. ADLs Comments: Per OT eval today "Mostly Ind with ADLs at baseline with use of sink bath. Daughter reports she has been assisting more than normal lately."     Hand Dominance        Extremity/Trunk Assessment   Upper Extremity Assessment Upper Extremity Assessment: Generalized weakness    Lower Extremity Assessment Lower Extremity Assessment: Generalized weakness       Communication      Cognition Arousal/Alertness: Lethargic, Suspect due to medications Behavior During Therapy: Flat affect Overall Cognitive Status: History of cognitive impairments - at baseline                                 General Comments: Oriented to person, place, and that she is in hospital d/t "seeing people".  Pt's daughter reports pt would not typically know date (may know the day).        General Comments  Nursing cleared pt for participation in physical therapy.  Pt agreeable to PT session.  Pt's daughter present throughout session.    Exercises     Assessment/Plan    PT Assessment Patient needs continued PT services  PT Problem List Decreased strength;Decreased activity  tolerance;Decreased balance;Decreased mobility;Decreased cognition;Decreased knowledge of use of DME;Decreased knowledge of precautions;Cardiopulmonary status limiting activity;Pain       PT Treatment Interventions DME instruction;Gait training;Stair training;Functional mobility training;Therapeutic activities;Therapeutic exercise;Balance training;Patient/family education    PT Goals (Current goals can be found in the Care Plan section)  Acute Rehab PT Goals Patient Stated Goal: to go home PT Goal Formulation: With patient/family Time For Goal Achievement: 07/10/23 Potential to Achieve Goals: Good    Frequency Min 1X/week     Co-evaluation               AM-PAC PT "6 Clicks" Mobility  Outcome Measure Help needed turning from your back to your side while in a flat bed without using bedrails?: A Little Help needed moving from lying on your back to sitting on the side of a flat bed without using bedrails?: A Little Help needed moving to and from a bed to a chair (including a wheelchair)?: A Little Help needed standing up from a chair using your arms (e.g., wheelchair or bedside chair)?: A Little Help needed to walk in hospital room?: A Lot Help needed climbing 3-5 steps with a railing? : A Lot 6 Click Score: 16    End of Session Equipment Utilized During Treatment: Gait belt;Oxygen (2 L  O2 via nasal cannula) Activity Tolerance: Patient limited by fatigue Patient left: in bed;with call bell/phone within reach;with bed alarm set;with family/visitor present Nurse Communication: Mobility status;Precautions PT Visit Diagnosis: Other abnormalities of gait and mobility (R26.89);Muscle weakness (generalized) (M62.81)    Time: 4782-9562 PT Time Calculation (min) (ACUTE ONLY): 22 min   Charges:   PT Evaluation $PT Eval Low Complexity: 1 Low   PT General Charges $$ ACUTE PT VISIT: 1 Visit        Hendricks Limes, PT 06/26/23, 4:19 PM

## 2023-06-26 NOTE — Progress Notes (Signed)
Patient sleepy, but easily arousable patient received ativan IV at 0421. Dr. Chipper Herb in the room rounding on patient and spoke with daughter on POC. Daughter requesting patient to go home.

## 2023-06-26 NOTE — Hospital Course (Signed)
Debbie Foster is a 89 y.o. Caucasian female with medical history significant for osteoarthritis, hypertension dyslipidemia, GERD, migraine and CVA, who presented to the emergency room with acute onset of altered mental status with visual hallucinations.  This x-ray showed underlying chronic lung disease with bronchiectasis and fibrosis as well as patchy bilateral airspace opacities concerning for acute superimposed airspace disease/pneumonia.  Patient also had leukocytosis and tachypnea, consistent with sepsis. Patient was started on antibiotics for pneumonia.  Based on history, most likely aspiration pneumonia.  Seen by speech therapy, no need to adjust diet.

## 2023-06-26 NOTE — Progress Notes (Signed)
Daughter at bedside with pt assisting her with Ensure drink.

## 2023-06-26 NOTE — Progress Notes (Signed)
Progress Note   Patient: Debbie Foster WUJ:[id number] DOB: 22-Mar-1925 DOA: 06/25/2023     1 DOS: the patient was seen and examined on 06/26/2023   Brief hospital course: Kimley JAKAILA RUBSAM is a 89 y.o. Caucasian female with medical history significant for osteoarthritis, hypertension dyslipidemia, GERD, migraine and CVA, who presented to the emergency room with acute onset of altered mental status with visual hallucinations.  This x-ray showed underlying chronic lung disease with bronchiectasis and fibrosis as well as patchy bilateral airspace opacities concerning for acute superimposed airspace disease/pneumonia.  Patient also had leukocytosis and tachypnea, consistent with sepsis. Patient was started on antibiotics for pneumonia.  Based on history, most likely aspiration pneumonia.  Seen by speech therapy, no need to adjust diet.   Principal Problem:   Sepsis due to pneumonia [facility]) Active Problems:   Acute respiratory failure with hypoxia (HCC)   Hyponatremia   Protein-calorie malnutrition, severe (HCC)   Pressure injury of skin   Essential hypertension   Controlled type 2 diabetes mellitus without complication, without long-term current use of insulin (HCC)   Dyslipidemia   Hallucinations   Acute metabolic encephalopathy   Senile dementia with delirium (HCC)   Assessment and Plan: * Sepsis due to pneumonia (HCC) Bilateral lower lobe aspiration pneumonia. Acute respiratory failure with hypoxia [facility]) Patient currently is hemodynamically stable, will discontinue IV fluids. Reviewed patient's chest x-ray results and image, most likely aspiration pneumonia.  Seen by speech therapy, no additional risk for aspiration.  Will continue Rocephin, discontinue Zithromax as patient does not have any risk for atypical pneumonia. Continue oxygen treatment.  Acute metabolic encephalopathy Delirium with dementia. Patient had a significant hallucination, agitation.  Was given Haldol.  Also required  dose of Ativan.  At this point, discontinue Ativan, continue as needed Haldol as well as added Seroquel at night. Per patient daughter, patient has significant memory loss, patient has chronic dementia.   Hyponatremia Limit, discontinue fluids.  Continue to follow.  Controlled type 2 diabetes mellitus without complication, without long-term current use of insulin (HCC) Continue sliding scale insulin.  Essential hypertension - We will continue her antihypertensives.  Severe protein calorie malnutrition. Patient appears severely malnourished with significant muscle atrophy.  Start protein supplement.  Will obtain PT/OT eval, home health versus nursing home.     Subjective:  Patient has some confusion, sleepy. Short of breath appear to be better today.  Physical Exam: Vitals:   06/26/23 0042 06/26/23 0536 06/26/23 0727 06/26/23 0743  BP: (!) 138/57 (!) 138/54 (!) 130/46   Pulse: (!) 57 65 (!) 50   Resp: 20 16 19    Temp: (!) 97.5 F (36.4 C) 97.8 F (36.6 C) 97.8 F (36.6 C)   TempSrc: Oral Oral Oral   SpO2: 100% 100% (!) 84% 95%  Weight:      Height:       General exam: Appears calm and comfortable, severely malnourished. Respiratory system: Decreased breathing sounds. Respiratory effort normal. Cardiovascular system: S1 & S2 heard, RRR. No JVD, murmurs, rubs, gallops or clicks. No pedal edema. Gastrointestinal system: Abdomen is nondistended, soft and nontender. No organomegaly or masses felt. Normal bowel sounds heard. Central nervous system: Drowsy and oriented x1. No focal neurological deficits. Extremities: Symmetric 5 x 5 power. Skin: No rashes, lesions or ulcers Psychiatry:    Data Reviewed:  Reviewed chest x-ray, lab results.  Family Communication: Daughter updated at bedside.  Disposition: Status is: Inpatient Remains inpatient appropriate because: Severity of disease, IV treatment.  Time spent: 35 minutes  Author: Marrion Coy, MD 06/26/2023  10:16 AM  For on call review [email or web address].

## 2023-06-26 NOTE — Progress Notes (Signed)
Patient more awake alert to self, confused. Patient has poor appetite, took a couple of bites of food. Daughter at bedside, daughter confirms patient has a poor appetite at home. Bed is low, locked and call light in reach for safety.

## 2023-06-26 NOTE — Evaluation (Signed)
Clinical/Bedside Swallow Evaluation Patient Details  Name: Debbie Foster MRN: [id number] Date of Birth: Jan 07, 1925  Today's Date: 06/26/2023 Time: SLP Start Time (ACUTE ONLY): 0825 SLP Stop Time (ACUTE ONLY): 0840 SLP Time Calculation (min) (ACUTE ONLY): 15 min  Past Medical History:  Past Medical History:  Diagnosis Date   Arthritis    "just about q bone in my body"   Blood dyscrasia    Bronchitis 11/2013   "first time I've ever had it; still coughing from it now" (08/05/2014)   Diverticulitis 2004   Double vision 2012   "left eye; from the eye stroke; can see fine w/my glasses"   GERD (gastroesophageal reflux disease)    h/o uses OTC   Heart murmur    High cholesterol    Hypertension    Migraine    "stopped w/the change of life; age 59"   Squamous carcinoma    arms and face   Stroke (HCC)    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   Past Surgical History:  Past Surgical History:  Procedure Laterality Date   CARPAL TUNNEL RELEASE Right    CATARACT EXTRACTION W/ INTRAOCULAR LENS IMPLANT Right 2003   CATARACT EXTRACTION W/ INTRAOCULAR LENS IMPLANT Left 2006   JOINT REPLACEMENT     REVERSE SHOULDER ARTHROPLASTY Right 08/05/2014   REVERSE SHOULDER ARTHROPLASTY Right 08/05/2014   Procedure: REVERSE SHOULDER ARTHROPLASTY;  Surgeon: Mable Paris, MD;  Location: [facility];  Service: Orthopedics;  Laterality: Right;  Right reverse total shoulder arthroplasty   TONSILLECTOMY     TOTAL HIP ARTHROPLASTY Left 1998   TOTAL KNEE ARTHROPLASTY Right 2005   HPI:  Debbie Foster is a 89 y.o. Caucasian female with medical history significant for osteoarthritis, hypertension dyslipidemia, GERD, migraine and CVA, who presented to the emergency room with acute onset of altered mental status with visual hallucinations.  The patient stated that she has been seeing people who are not there.  She admits to mild cough which has been nonproductive as well as mild dyspnea without wheezing.  No fever or  chills.  No nausea or vomiting or abdominal pain.  No chest pain or palpitations.  No dysuria, oliguria or hematuria or flank pain. Head CT 7/28/24T: No CT evidence for acute intracranial abnormality. Atrophy and chronic small vessel ischemic changes of the white matter. Chest XR 06/25/23: Underlying chronic lung disease with bronchiectasis and fibrosis. Patchy bilateral airspace opacities concerning for acute superimposed airspace disease/pneumonia. Pt currently on a regular solids and thin liquids diet and room air.    Assessment / Plan / Recommendation  Clinical Impression  Pt seen for bedside swallow assessment in the setting of concern for aspiration PNA. Pt seen with trials of thin liquids (via straw), mixed consistencies (pill and thin liquids), and regular solids. No overt or subtle s/sx pharyngeal dysphagia noted. No change to vocal quality across trials. Oral phase grossly intact- with complete manipulation and clearance of regular solid from oral cavity. Pt denied history of PNA or dysphagia. Based on age and current debility, pt is at increased risk of aspiration, therefore recommend aspiration precautions (slow rate, small bites, elevated HOB, and alert for PO intake). Continue with current unrestricted diet. No further acute SLP services indicated. MD and RN aware of recommendations. SLP Visit Diagnosis: Dysphagia, unspecified (R13.10)    Aspiration Risk  Mild aspiration risk    Diet Recommendation   Thin;Age appropriate regular  Medication Administration: Whole meds with liquid    Other  Recommendations  Oral Care Recommendations: Oral care BID    Recommendations for follow up therapy are one component of a multi-disciplinary discharge planning process, led by the attending physician.  Recommendations may be updated based on patient status, additional functional criteria and insurance authorization.  Follow up Recommendations No SLP follow up      Assistance Recommended at  Discharge    Functional Status Assessment Patient has not had a recent decline in their functional status    Swallow Study   General Date of Onset: 06/26/23 HPI: Debbie Foster is a 89 y.o. Caucasian female with medical history significant for osteoarthritis, hypertension dyslipidemia, GERD, migraine and CVA, who presented to the emergency room with acute onset of altered mental status with visual hallucinations.  The patient stated that she has been seeing people who are not there.  She admits to mild cough which has been nonproductive as well as mild dyspnea without wheezing.  No fever or chills.  No nausea or vomiting or abdominal pain.  No chest pain or palpitations.  No dysuria, oliguria or hematuria or flank pain. Head CT 7/28/24T: No CT evidence for acute intracranial abnormality. Atrophy and chronic small vessel ischemic changes of the white matter. Chest XR 06/25/23: Underlying chronic lung disease with bronchiectasis and fibrosis. Patchy bilateral airspace opacities concerning for acute superimposed airspace disease/pneumonia. Pt currently on a regular solids and thin liquids diet and room air. Type of Study: Bedside Swallow Evaluation Previous Swallow Assessment: none in chart Diet Prior to this Study: Regular;Thin liquids (Level 0) Temperature Spikes Noted:  (temp: 100; WBC 12.8) Respiratory Status: Room air History of Recent Intubation: No Behavior/Cognition: Alert;Cooperative Oral Cavity Assessment: Within Functional Limits Oral Care Completed by SLP: Recent completion by staff Oral Cavity - Dentition: Adequate natural dentition Vision: Functional for self-feeding Self-Feeding Abilities: Able to feed self Patient Positioning: Upright in bed Baseline Vocal Quality: Normal Volitional Swallow: Able to elicit    Oral/Motor/Sensory Function Overall Oral Motor/Sensory Function: Within functional limits   Ice Chips Ice chips: Not tested   Thin Liquid Thin Liquid: Within functional  limits    Nectar Thick Nectar Thick Liquid: Not tested   Honey Thick Honey Thick Liquid: Not tested   Puree Puree: Not tested   Solid     Solid: Within functional limits     Debbie Rajanae Mantia Clapp  MS [facility] SLP   Debbie J Clapp 06/26/2023,9:13 AM

## 2023-06-26 NOTE — Evaluation (Signed)
Occupational Therapy Evaluation Patient Details Name: Debbie Foster MRN: [id number] DOB: 09-29-1925 Today's Date: 06/26/2023   History of Present Illness 89 y.o. Caucasian female with medical history significant for osteoarthritis, hypertension dyslipidemia, GERD, migraine and CVA, who presented to the emergency room with acute onset of altered mental status with visual hallucinations.   This x-ray showed underlying chronic lung disease with bronchiectasis and fibrosis as well as patchy bilateral airspace opacities concerning for acute superimposed airspace disease/pneumonia.   Clinical Impression   Patient presenting with decreased Ind in self care,balance, functional mobility/transfers, endurance, and safety awareness. Patient's daughter present during session and reports pt lives with her and she is able to provide 24/7 supervision/assist at discharge. Pt normally ambulates with RW short distances and is able to perform ADLs herself but lately has needed more assistance.  Patient is very lethargic this session likely secondary to medications received. Pt briefly opens eyes and needs min A to come to EOB. Pt sitting with close supervision and initiates standing x 2 with therapist but continues to keep eyes open and generally does not appear to feel well. OT assists pt back to bed for comfort. Patient will benefit from acute OT to increase overall independence in the areas of ADLs, functional mobility, and safety awareness  in order to safely discharge.     Recommendations for follow up therapy are one component of a multi-disciplinary discharge planning process, led by the attending physician.  Recommendations may be updated based on patient status, additional functional criteria and insurance authorization.   Assistance Recommended at Discharge Frequent or constant Supervision/Assistance  Patient can return home with the following A little help with bathing/dressing/bathroom;Assistance with  cooking/housework;Assist for transportation;Help with stairs or ramp for entrance    Functional Status Assessment  Patient has had a recent decline in their functional status and demonstrates the ability to make significant improvements in function in a reasonable and predictable amount of time.  Equipment Recommendations  None recommended by OT    Recommendations for Other Services       Precautions / Restrictions Precautions Precautions: Fall      Mobility Bed Mobility Overal bed mobility: Needs Assistance Bed Mobility: Supine to Sit, Sit to Supine     Supine to sit: Min assist Sit to supine: Min assist        Transfers Overall transfer level: Needs assistance Equipment used: 1 person hand held assist Transfers: Sit to/from Stand Sit to Stand: Min assist                  Balance Overall balance assessment: Needs assistance Sitting-balance support: Feet supported Sitting balance-Leahy Scale: Good                                     ADL either performed or assessed with clinical judgement   ADL Overall ADL's : Needs assistance/impaired                         Toilet Transfer: Minimal assistance Toilet Transfer Details (indicate cue type and reason): simulated                 Vision Baseline Vision/History: 1 Wears glasses Patient Visual Report: No change from baseline              Pertinent Vitals/Pain Pain Assessment Pain Assessment: No/denies pain     Hand Dominance Right  Extremity/Trunk Assessment Upper Extremity Assessment Upper Extremity Assessment: Generalized weakness   Lower Extremity Assessment Lower Extremity Assessment: Generalized weakness       Communication Communication Communication: HOH   Cognition Arousal/Alertness: Lethargic, Suspect due to medications Behavior During Therapy: Flat affect Overall Cognitive Status: History of cognitive impairments - at baseline                                  General Comments: Daughter reports that she is oriented to self but at times does not know her daughter.                Home Living Family/patient expects to be discharged to:: Private residence Living Arrangements: Children Available Help at Discharge: Family;Available 24 hours/day Type of Home: House Home Access: Stairs to enter [employer] of Steps: 1+1 Entrance Stairs-Rails: None Home Layout: One level     Bathroom Shower/Tub: Producer, television/film/video: Handicapped height     Home Equipment: Agricultural consultant (2 wheels);Shower seat;Cane - single point;Rollator (4 wheels);Grab bars - tub/shower;Grab bars - toilet   Additional Comments: Pt lives with dtr who provides 24/7 supervision      Prior Functioning/Environment Prior Level of Function : Needs assist             Mobility Comments: Daughter provides SBA during ambulation with pt able to amb with a RW limited community distances.  Mostly only leaves the house for MD apts but able to walk from the parking lot into the office with her RW ADLs Comments: Mostly Ind with ADLs at baseline with use of sink bath. Daughter reports she has been assisting more than normal lately.        OT Problem List: Decreased strength;Decreased activity tolerance;Decreased safety awareness;Impaired balance (sitting and/or standing);Decreased knowledge of use of DME or AE      OT Treatment/Interventions: Self-care/ADL training;Therapeutic exercise;Therapeutic activities;Energy conservation;DME and/or AE instruction;Patient/family education;Balance training    OT Goals(Current goals can be found in the care plan section) Acute Rehab OT Goals Patient Stated Goal: to go home and return to PLOF OT Goal Formulation: With family Time For Goal Achievement: 07/10/23 Potential to Achieve Goals: Fair ADL Goals Pt Will Perform Grooming: with modified independence Pt Will Perform Lower Body Dressing:  with modified independence Pt Will Transfer to Toilet: with modified independence Pt Will Perform Toileting - Clothing Manipulation and hygiene: with modified independence  OT Frequency: Min 1X/week       AM-PAC OT "6 Clicks" Daily Activity     Outcome Measure Help from another person eating meals?: None Help from another person taking care of personal grooming?: A Little Help from another person toileting, which includes using toliet, bedpan, or urinal?: A Little Help from another person bathing (including washing, rinsing, drying)?: A Little Help from another person to put on and taking off regular upper body clothing?: A Little Help from another person to put on and taking off regular lower body clothing?: A Little 6 Click Score: 19   End of Session Nurse Communication: Mobility status  Activity Tolerance: Patient limited by lethargy Patient left: in bed;with call bell/phone within reach;with bed alarm set;with family/visitor present  OT Visit Diagnosis: Unsteadiness on feet (R26.81);Repeated falls (R29.6);Muscle weakness (generalized) (M62.81)                Time: 1610-9604 OT Time Calculation (min): 17 min Charges:  OT General Charges $OT Visit: 1  Visit OT Evaluation $OT Eval Low Complexity: 1 Low OT Treatments $Therapeutic Activity: 8-22 mins  Jackquline Denmark, MS, OTR/L , CBIS ascom [phone]  06/26/23, 12:23 PM

## 2023-06-27 DIAGNOSIS — E43 Unspecified severe protein-calorie malnutrition: Secondary | ICD-10-CM | POA: Diagnosis not present

## 2023-06-27 DIAGNOSIS — F0392 Unspecified dementia, unspecified severity, with psychotic disturbance: Secondary | ICD-10-CM | POA: Diagnosis not present

## 2023-06-27 DIAGNOSIS — J9601 Acute respiratory failure with hypoxia: Secondary | ICD-10-CM | POA: Diagnosis not present

## 2023-06-27 DIAGNOSIS — J189 Pneumonia, unspecified organism: Secondary | ICD-10-CM | POA: Diagnosis not present

## 2023-06-27 LAB — CBC
HCT: 32.2 % — ABNORMAL LOW (ref 36.0–46.0)
Hemoglobin: 10.2 g/dL — ABNORMAL LOW (ref 12.0–15.0)
MCH: 26.5 pg (ref 26.0–34.0)
MCHC: 31.7 g/dL (ref 30.0–36.0)
MCV: 83.6 fL (ref 80.0–100.0)
Platelets: 501 10*3/uL — ABNORMAL HIGH (ref 150–400)
RBC: 3.85 MIL/uL — ABNORMAL LOW (ref 3.87–5.11)
RDW: 14.8 % (ref 11.5–15.5)
WBC: 13.2 10*3/uL — ABNORMAL HIGH (ref 4.0–10.5)
nRBC: 0 % (ref 0.0–0.2)

## 2023-06-27 MED ORDER — QUETIAPINE FUMARATE 25 MG PO TABS: 25.0000 mg | ORAL_TABLET | Freq: Every day | ORAL | 0 refills | Status: DC

## 2023-06-27 MED ORDER — CEFDINIR 300 MG PO CAPS: 300.0000 mg | ORAL_CAPSULE | Freq: Every day | ORAL | 0 refills | Status: DC

## 2023-06-27 NOTE — TOC Transition Note (Signed)
Transition of Care [facility]) - CM/SW Discharge Note   Patient Details  Name: Debbie Foster MRN: [id number] Date of Birth: [birth year]  Transition of Care [facility]) CM/SW Contact:  Garret Reddish, RN Phone Number: 06/27/2023, 10:25 AM   Clinical Narrative:     Chart reviewed.  Noted that patient has orders for discharge today.    I have informed Debbie Foster with [facility] that patient will be a discharge for today.  [facility] will provide Home Health RN, PT and OT for home health services.    Patient's daughter will transport her home today.    I have informed staff nurse of the above information.     Final next level of care: Home w Home Health Services Barriers to Discharge: No Barriers Identified   Patient Goals and CMS Choice   Choice offered to / list presented to : Adult Children  Discharge Placement                    Name of family member notified: Debbie Foster  ( patient's daughter) Patient and family notified of of transfer: 06/27/23  Discharge Plan and Services Additional resources added to the After Visit Summary for       Post Acute Care Choice: Home Health                    HH Arranged: RN, PT, OT [facility] Agency: [facility] Date [facility] Agency Contacted: 06/27/23 Time HH Agency Contacted: 1000 Representative spoke with at [facility] Agency: Debbie Foster  Social Determinants of Health (SDOH) Interventions SDOH Screenings   Food Insecurity: No Food Insecurity (06/25/2023)  Housing: Low Risk  (06/25/2023)  Transportation Needs: No Transportation Needs (06/25/2023)  Utilities: Not At Risk (06/25/2023)  Tobacco Use: Medium Risk (05/12/2023)     Readmission Risk Interventions     No data to display

## 2023-06-27 NOTE — Discharge Summary (Signed)
Physician Discharge Summary   Patient: Debbie Foster MRN: [id number] DOB: November 21, 1925  Admit date:     06/25/2023  Discharge date: 06/27/23  Discharge Physician: Marrion Coy   PCP: [facility]   Recommendations at discharge:   Follow-up with PCP in 1 week.  Discharge Diagnoses: Principal Problem:   Sepsis due to pneumonia [facility]) Active Problems:   Acute respiratory failure with hypoxia (HCC)   Hyponatremia   Protein-calorie malnutrition, severe (HCC)   Pressure injury of skin   Essential hypertension   Controlled type 2 diabetes mellitus without complication, without long-term current use of insulin (HCC)   Dyslipidemia   Hallucinations   Acute metabolic encephalopathy   Senile dementia with delirium (HCC) Reactive thrombocytosis. Other diagnosis see assessment and plan section. Resolved Problems:   * No resolved hospital problems. *  Hospital Course: Debbie Foster is a 89 y.o. Caucasian female with medical history significant for osteoarthritis, hypertension dyslipidemia, GERD, migraine and CVA, who presented to the emergency room with acute onset of altered mental status with visual hallucinations.  This x-ray showed underlying chronic lung disease with bronchiectasis and fibrosis as well as patchy bilateral airspace opacities concerning for acute superimposed airspace disease/pneumonia.  Patient also had leukocytosis and tachypnea, consistent with sepsis. Patient was started on antibiotics for pneumonia.  Based on history, most likely aspiration pneumonia.  Seen by speech therapy, no need to adjust diet.  Assessment and Plan: * Sepsis due to pneumonia (HCC) Bilateral lower lobe aspiration pneumonia. Acute respiratory failure with hypoxia [facility]) Patient currently is hemodynamically stable, will discontinue IV fluids. Reviewed patient's chest x-ray results and image, most likely aspiration pneumonia.  Seen by speech therapy, no additional risk for aspiration.  Will  continue Rocephin, discontinue Zithromax as patient does not have any risk for atypical pneumonia. Condition has improved, hemodynamic stable, off oxygen.  Will continue oral antibiotics for additional 3 days to complete a 5-day course.  Patient is advised to follow-up with PCP as outpatient.   Acute metabolic encephalopathy Delirium with dementia. Insomnia. Patient had a significant hallucination, agitation.  Was given Haldol.  Also required dose of Ativan.  At this point, discontinue Ativan, continue as needed Haldol as well as added Seroquel at night. Per patient daughter, patient has significant memory loss, patient has chronic dementia. Added Seroquel for insomnia.   Hyponatremia Improving.   Controlled type 2 diabetes mellitus without complication, without long-term current use of insulin (HCC) Follow-up with PCP as outpatient.   Essential hypertension Resume home treatment.   Severe protein calorie malnutrition. Patient appears severely malnourished with significant muscle atrophy.  Encourage protein intake.  Pressure ulcer POA.  Follow-up with PCP as outpatient. Pressure Injury 06/25/23 Sacrum Mid Stage 1 -  Intact skin with non-blanchable redness of a localized area usually over a bony prominence. skin intact, non blanchable redness (Active)  06/25/23 2042  Location: Sacrum  Location Orientation: Mid  Staging: Stage 1 -  Intact skin with non-blanchable redness of a localized area usually over a bony prominence.  Wound Description (Comments): skin intact, non blanchable redness  Present on Admission: Yes           Consultants: None Procedures performed: None  Disposition: Home health Diet recommendation:  Discharge Diet Orders (From admission, onward)     Start     Ordered   06/27/23 0000  Diet - low sodium heart healthy        06/27/23 0957  Cardiac diet DISCHARGE MEDICATION: Allergies as of 06/27/2023   No Known Allergies      Medication  List     STOP taking these medications    benzonatate 100 MG capsule Commonly known as: TESSALON       TAKE these medications    aspirin 81 MG tablet Take 81 mg by mouth daily.   cefdinir 300 MG capsule Commonly known as: OMNICEF Take 1 capsule (300 mg total) by mouth daily for 3 days.   citalopram 10 MG tablet Commonly known as: CELEXA Take 10 mg by mouth daily.   Coenzyme Q10 100 MG capsule Take 100 mg by mouth daily.   Combigan 0.2-0.5 % ophthalmic solution Generic drug: brimonidine-timolol Place 1 drop into both eyes every 12 (twelve) hours.   feeding supplement Liqd Take 237 mLs by mouth 2 (two) times daily between meals.   metoprolol succinate 50 MG 24 hr tablet Commonly known as: TOPROL-XL Take 25 mg by mouth daily. Take half a tablet by mouth daily   nitroGLYCERIN 0.4 MG SL tablet Commonly known as: NITROSTAT Place 0.4 mg under the tongue every 5 (five) minutes as needed.   Olmesartan-amLODIPine-HCTZ 40-10-12.5 MG Tabs Take 1 tablet by mouth once daily. daily   prednisoLONE acetate 1 % ophthalmic suspension Commonly known as: PRED FORTE Place 1 drop into the right eye every morning.   QUEtiapine 25 MG tablet Commonly known as: SEROQUEL Take 1 tablet (25 mg total) by mouth at bedtime.   temazepam 7.5 MG capsule Commonly known as: RESTORIL Take 7.5 mg by mouth at bedtime.   Trelegy Ellipta 100-62.5-25 MCG/ACT Aepb Generic drug: Fluticasone-Umeclidin-Vilant Inhale 1 Inhalation into the lungs in the morning and at bedtime.   trolamine salicylate 10 % cream Commonly known as: ASPERCREME Apply 1 application topically as needed for muscle pain.   Vitamin D3 25 MCG (1000 UT) Caps Take 1,000 Units by mouth daily.               Discharge Care Instructions  (From admission, onward)           Start     Ordered   06/27/23 0000  Discharge wound care:       Comments: Follow with pcp   06/27/23 0957            Discharge  Exam: Filed Weights   06/25/23 1840 06/25/23 2027  Weight: 39.5 kg 38.4 kg   General exam: Appears calm and comfortable, appears severely malnourished. Respiratory system: Clear to auscultation. Respiratory effort normal. Cardiovascular system: S1 & S2 heard, RRR. No JVD, murmurs, rubs, gallops or clicks. No pedal edema. Gastrointestinal system: Abdomen is nondistended, soft and nontender. No organomegaly or masses felt. Normal bowel sounds heard. Central nervous system: Alert and oriented x2, but knows the year.. No focal neurological deficits. Extremities: Symmetric 5 x 5 power. Skin: No rashes, lesions or ulcers Psychiatry:  Mood & affect appropriate.    Condition at discharge: fair  The results of significant diagnostics from this hospitalization (including imaging, microbiology, ancillary and laboratory) are listed below for reference.   Imaging Studies: CT Head Wo Contrast  Result Date: 06/25/2023 CLINICAL DATA:  Altered mental status EXAM: CT HEAD WITHOUT CONTRAST TECHNIQUE: Contiguous axial images were obtained from the base of the skull through the vertex without intravenous contrast. RADIATION DOSE REDUCTION: This exam was performed according to the departmental dose-optimization program which includes automated exposure control, adjustment of the mA and/or kV according to patient size and/or use of  iterative reconstruction technique. COMPARISON:  CT brain 07/27/2022 FINDINGS: Brain: No acute territorial infarction, hemorrhage or intracranial mass. Mild atrophy. Patchy white matter hypodensity consistent with chronic small vessel ischemic change. Stable ventricle size Vascular: No hyperdense vessels. Vertebral and carotid vascular calcification Skull: Normal. Negative for fracture or focal lesion. Sinuses/Orbits: No acute finding. Other: None IMPRESSION: 1. No CT evidence for acute intracranial abnormality. 2. Atrophy and chronic small vessel ischemic changes of the white matter.  Electronically Signed   By: Jasmine Pang M.D.   On: 06/25/2023 18:08   DG Chest Port 1 View  Result Date: 06/25/2023 CLINICAL DATA:  Possible sepsis EXAM: PORTABLE CHEST 1 VIEW COMPARISON:  04/04/2023, chest CT 04/25/2023 FINDINGS: Underlying chronic lung disease with bronchiectasis and fibrosis. Patchy bilateral airspace opacities concerning for acute superimposed airspace disease. Stable cardiomediastinal silhouette. No pneumothorax. Incompletely visualized right shoulder replacement. IMPRESSION: Underlying chronic lung disease with bronchiectasis and fibrosis. Patchy bilateral airspace opacities concerning for acute superimposed airspace disease/pneumonia. Electronically Signed   By: Jasmine Pang M.D.   On: 06/25/2023 17:42    Microbiology: Results for orders placed or performed during the hospital encounter of 06/25/23  Blood Culture (routine x 2)     Status: None (Preliminary result)   Collection Time: 06/25/23  6:36 PM   Specimen: BLOOD  Result Value Ref Range Status   Specimen Description BLOOD BLOOD LEFT ARM  Final   Special Requests   Final    BOTTLES DRAWN AEROBIC ONLY Blood Culture results may not be optimal due to an inadequate volume of blood received in culture bottles   Culture   Final    NO GROWTH 2 DAYS Performed at [facility], [address] [place], [place] [address]    Report Status PENDING  Incomplete  Blood Culture (routine x 2)     Status: None (Preliminary result)   Collection Time: 06/25/23  6:36 PM   Specimen: BLOOD  Result Value Ref Range Status   Specimen Description BLOOD BLOOD RIGHT ARM  Final   Special Requests   Final    BOTTLES DRAWN AEROBIC AND ANAEROBIC Blood Culture adequate volume   Culture   Final    NO GROWTH 2 DAYS Performed at [facility], [address] [place], [place] [address]    Report Status PENDING  Incomplete    Labs: CBC: Recent Labs  Lab 06/25/23 1726 06/26/23 0520 06/27/23 0809  WBC 12.8* 12.8*  13.2*  NEUTROABS 9.6*  --   --   HGB 11.2* 9.5* 10.2*  HCT 35.9* 30.3* 32.2*  MCV 83.7 84.9 83.6  PLT 584* 514* 501*   Basic Metabolic Panel: Recent Labs  Lab 06/25/23 1726 06/26/23 0520  NA 129* 132*  K 4.0 3.7  CL 91* 98  CO2 25 27  GLUCOSE 108* 107*  BUN 25* 21  CREATININE 0.92 0.70  CALCIUM 9.8 8.8*   Liver Function Tests: Recent Labs  Lab 06/25/23 1726  AST 15  ALT 9  ALKPHOS 63  BILITOT 0.3  PROT 8.7*  ALBUMIN 3.1*   CBG: No results for input(s): "GLUCAP" in the last 168 hours.  Discharge time spent: greater than 30 minutes.  Signed: Marrion Coy, MD [facility] 06/27/2023

## 2023-06-29 ENCOUNTER — Ambulatory Visit: Payer: Self-pay | Admitting: *Deleted

## 2023-06-29 ENCOUNTER — Ambulatory Visit: Payer: Self-pay

## 2023-06-29 NOTE — Telephone Encounter (Signed)
  Chief Complaint: See documentation notes Symptoms:  Frequency:  Pertinent Negatives: Patient denies  Disposition: [] ED /[] Urgent Care (no appt availability in office) / [] Appointment(In office/virtual)/ []  [facility]/ [] Home Care/ [] Refused Recommended Disposition /[] [facility]/ [x]  Follow-up with PCP Additional Notes:

## 2023-06-29 NOTE — Telephone Encounter (Signed)
Returned daughter's call. I did not see that she already spoken to NT.  Daughter will follow up with PCP as advised by NT earlier.   Summary: Questions for nurse about pt's hospital stay   Pt's daughter called requesting to speak to a nurse about the patient's hospital stay. She says the EMS men never gave her back her DNR and she needs to get this back.  Best contact: [phone] or [phone]

## 2023-06-29 NOTE — Telephone Encounter (Signed)
Daughter Mindi Junker calling in on the community line of [facility].    Pt  was brought in to the hospital Sunday afternoon with hallucinations by EMS.    She's had this for a while now.   The last 3-4 days she is worse all day long with hallucinations.     Mindi Junker called EMS and they came out.   They thought she had a UTI.  Her xray showed pneumonia again.  In May she was in the hospital for pneumonia. She was admitted.  Dr. Chipper Herb released her Tues. Morning.    She only had 2 bags of antibiotics.   Before in May she had 3-4 bags of antibiotics.    He sent her home with 3 antibiotic capsules.   She took 2 of them.   She has one more to take. The hallucinations are still there.   Did she need to be in longer and get more antibiotics. She has an appt with PCP on Tues.   He is retiring.   I do have a new dr at [facility].   Sept 23 is when we seen him as a new pt.     [facility] called back this morning and he can see her on Tues. Her PCP she has now.    I live with my mother.     She was given a new sleep pill and last night she was calling me saying someone was coming in to get her.   She was peeing every 30 minutes.   I had a rough night.    Mindi Junker is going to call her PCP.  I don't know if she could go to hospice.  I let Mindi Junker know she needed to talk with PCP of her mother.  She was agreeable to this and will talk with her mother's PCP on Tues. During the appt.    I let her know she can bring her mother to the ED if the hallucinations remain bad. She thanked me for returning her call. Reason for Disposition  [1] Caller requesting NON-URGENT health information AND [2] PCP's office is the best resource  Answer Assessment - Initial Assessment Questions 1. REASON FOR CALL or QUESTION: "What is your reason for calling today?" or "How can I best help you?" or "What question do you have that I can help answer?"     See documentation notes  Protocols used: Information Only  Call - No Triage-A-AH

## 2023-06-29 NOTE — Telephone Encounter (Signed)
Summary: Questions for nurse about pt's hospital stay   Pt's daughter called requesting to speak to a nurse about the patient's hospital stay. She says the EMS men never gave her back her DNR and she needs to get this back.  Best contact: [phone] or [phone]      Called daughter - [facility] to return our call.

## 2023-06-30 ENCOUNTER — Inpatient Hospital Stay
Admission: EM | Admit: 2023-06-30 | Discharge: 2023-07-03 | DRG: 951 | Disposition: A | Discharge status: Hospice | Payer: Medicare Other | Attending: Student | Admitting: Student

## 2023-06-30 ENCOUNTER — Emergency Department: Payer: Medicare Other

## 2023-06-30 ENCOUNTER — Encounter: Payer: Self-pay | Admitting: Internal Medicine

## 2023-06-30 ENCOUNTER — Other Ambulatory Visit: Payer: Self-pay

## 2023-06-30 DIAGNOSIS — L89151 Pressure ulcer of sacral region, stage 1: Secondary | ICD-10-CM | POA: Diagnosis present

## 2023-06-30 DIAGNOSIS — F05 Delirium due to known physiological condition: Secondary | ICD-10-CM | POA: Diagnosis present

## 2023-06-30 DIAGNOSIS — Z96611 Presence of right artificial shoulder joint: Secondary | ICD-10-CM | POA: Diagnosis present

## 2023-06-30 DIAGNOSIS — Z515 Encounter for palliative care: Secondary | ICD-10-CM | POA: Diagnosis not present

## 2023-06-30 DIAGNOSIS — E119 Type 2 diabetes mellitus without complications: Secondary | ICD-10-CM

## 2023-06-30 DIAGNOSIS — Z7982 Long term (current) use of aspirin: Secondary | ICD-10-CM

## 2023-06-30 DIAGNOSIS — E7800 Pure hypercholesterolemia, unspecified: Secondary | ICD-10-CM | POA: Diagnosis present

## 2023-06-30 DIAGNOSIS — Z79899 Other long term (current) drug therapy: Secondary | ICD-10-CM

## 2023-06-30 DIAGNOSIS — J189 Pneumonia, unspecified organism: Secondary | ICD-10-CM | POA: Diagnosis not present

## 2023-06-30 DIAGNOSIS — Z66 Do not resuscitate: Secondary | ICD-10-CM | POA: Diagnosis present

## 2023-06-30 DIAGNOSIS — Z87891 Personal history of nicotine dependence: Secondary | ICD-10-CM

## 2023-06-30 DIAGNOSIS — F0390 Unspecified dementia without behavioral disturbance: Secondary | ICD-10-CM | POA: Diagnosis present

## 2023-06-30 DIAGNOSIS — I110 Hypertensive heart disease with heart failure: Secondary | ICD-10-CM | POA: Diagnosis present

## 2023-06-30 DIAGNOSIS — I5032 Chronic diastolic (congestive) heart failure: Secondary | ICD-10-CM | POA: Diagnosis not present

## 2023-06-30 DIAGNOSIS — H532 Diplopia: Secondary | ICD-10-CM | POA: Diagnosis present

## 2023-06-30 DIAGNOSIS — F0392 Unspecified dementia, unspecified severity, with psychotic disturbance: Secondary | ICD-10-CM | POA: Diagnosis present

## 2023-06-30 DIAGNOSIS — Z7951 Long term (current) use of inhaled steroids: Secondary | ICD-10-CM

## 2023-06-30 DIAGNOSIS — M1990 Unspecified osteoarthritis, unspecified site: Secondary | ICD-10-CM | POA: Diagnosis present

## 2023-06-30 DIAGNOSIS — K219 Gastro-esophageal reflux disease without esophagitis: Secondary | ICD-10-CM | POA: Diagnosis present

## 2023-06-30 DIAGNOSIS — Z681 Body mass index (BMI) 19 or less, adult: Secondary | ICD-10-CM

## 2023-06-30 DIAGNOSIS — I69398 Other sequelae of cerebral infarction: Secondary | ICD-10-CM

## 2023-06-30 DIAGNOSIS — Z85828 Personal history of other malignant neoplasm of skin: Secondary | ICD-10-CM

## 2023-06-30 DIAGNOSIS — E871 Hypo-osmolality and hyponatremia: Secondary | ICD-10-CM | POA: Diagnosis present

## 2023-06-30 DIAGNOSIS — Z96651 Presence of right artificial knee joint: Secondary | ICD-10-CM | POA: Diagnosis present

## 2023-06-30 DIAGNOSIS — Z96642 Presence of left artificial hip joint: Secondary | ICD-10-CM | POA: Diagnosis present

## 2023-06-30 DIAGNOSIS — I10 Essential (primary) hypertension: Secondary | ICD-10-CM | POA: Diagnosis present

## 2023-06-30 DIAGNOSIS — Z8249 Family history of ischemic heart disease and other diseases of the circulatory system: Secondary | ICD-10-CM

## 2023-06-30 DIAGNOSIS — J188 Other pneumonia, unspecified organism: Secondary | ICD-10-CM | POA: Diagnosis present

## 2023-06-30 DIAGNOSIS — R4182 Altered mental status, unspecified: Principal | ICD-10-CM

## 2023-06-30 DIAGNOSIS — Z961 Presence of intraocular lens: Secondary | ICD-10-CM | POA: Diagnosis present

## 2023-06-30 LAB — CBC WITH DIFFERENTIAL/PLATELET
Abs Immature Granulocytes: 0.02 10*3/uL (ref 0.00–0.07)
Basophils Absolute: 0.1 10*3/uL (ref 0.0–0.1)
Basophils Relative: 1 %
Eosinophils Absolute: 0.3 10*3/uL (ref 0.0–0.5)
Eosinophils Relative: 3 %
HCT: 36.3 % (ref 36.0–46.0)
Hemoglobin: 11.4 g/dL — ABNORMAL LOW (ref 12.0–15.0)
Immature Granulocytes: 0 %
Lymphocytes Relative: 17 %
Lymphs Abs: 1.4 10*3/uL (ref 0.7–4.0)
MCH: 26.3 pg (ref 26.0–34.0)
MCHC: 31.4 g/dL (ref 30.0–36.0)
MCV: 83.8 fL (ref 80.0–100.0)
Monocytes Absolute: 0.7 10*3/uL (ref 0.1–1.0)
Monocytes Relative: 8 %
Neutro Abs: 6.2 10*3/uL (ref 1.7–7.7)
Neutrophils Relative %: 71 %
Platelets: 581 10*3/uL — ABNORMAL HIGH (ref 150–400)
RBC: 4.33 MIL/uL (ref 3.87–5.11)
RDW: 14.9 % (ref 11.5–15.5)
WBC: 8.7 10*3/uL (ref 4.0–10.5)
nRBC: 0 % (ref 0.0–0.2)

## 2023-06-30 LAB — URINALYSIS, W/ REFLEX TO CULTURE (INFECTION SUSPECTED)
Bacteria, UA: NONE SEEN
Bilirubin Urine: NEGATIVE
Glucose, UA: NEGATIVE mg/dL
Hgb urine dipstick: NEGATIVE
Ketones, ur: NEGATIVE mg/dL
Leukocytes,Ua: NEGATIVE
Nitrite: NEGATIVE
Protein, ur: 30 mg/dL — AB
Specific Gravity, Urine: 1.006 (ref 1.005–1.030)
pH: 7 (ref 5.0–8.0)

## 2023-06-30 LAB — COMPREHENSIVE METABOLIC PANEL
ALT: 10 U/L (ref 0–44)
AST: 16 U/L (ref 15–41)
Albumin: 2.8 g/dL — ABNORMAL LOW (ref 3.5–5.0)
Alkaline Phosphatase: 55 U/L (ref 38–126)
Anion gap: 8 (ref 5–15)
BUN: 16 mg/dL (ref 8–23)
CO2: 30 mmol/L (ref 22–32)
Calcium: 10 mg/dL (ref 8.9–10.3)
Chloride: 92 mmol/L — ABNORMAL LOW (ref 98–111)
Creatinine, Ser: 0.75 mg/dL (ref 0.44–1.00)
GFR, Estimated: >60 mL/min (ref >60)
Glucose, Bld: 106 mg/dL — ABNORMAL HIGH (ref 70–99)
Potassium: 4.2 mmol/L (ref 3.5–5.1)
Sodium: 130 mmol/L — ABNORMAL LOW (ref 135–145)
Total Bilirubin: 0.6 mg/dL (ref 0.3–1.2)
Total Protein: 8.5 g/dL — ABNORMAL HIGH (ref 6.5–8.1)

## 2023-06-30 LAB — LACTIC ACID, PLASMA
Lactic Acid, Venous: 0.8 mmol/L (ref 0.5–1.9)
Lactic Acid, Venous: 0.9 mmol/L (ref 0.5–1.9)

## 2023-06-30 MED ORDER — HALOPERIDOL LACTATE 2 MG/ML PO CONC: 0.5000 mg | ORAL | Status: DC | PRN

## 2023-06-30 MED ORDER — HALOPERIDOL LACTATE 5 MG/ML IJ SOLN
0.5000 mg | INTRAMUSCULAR | Status: DC | PRN
  Administered 2023-07-02: 0.5 mg via INTRAVENOUS
  Administered 2023-07-02: 0.1 mg via INTRAVENOUS
  Administered 2023-07-03: 0.5 mg via INTRAVENOUS
  Filled 2023-06-30 (×3): qty 1

## 2023-06-30 MED ORDER — MAGIC MOUTHWASH: 15.0000 mL | Freq: Four times a day (QID) | ORAL | Status: DC | PRN

## 2023-06-30 MED ORDER — HALOPERIDOL 0.5 MG PO TABS
0.5000 mg | ORAL_TABLET | ORAL | Status: DC | PRN
  Administered 2023-07-01: 0.5 mg via ORAL
  Filled 2023-06-30: qty 1

## 2023-06-30 MED ORDER — SENNA 8.6 MG PO TABS: 1.0000 | ORAL_TABLET | Freq: Every evening | ORAL | Status: DC | PRN

## 2023-06-30 MED ORDER — LORAZEPAM 2 MG/ML IJ SOLN
0.5000 mg | INTRAMUSCULAR | Status: DC | PRN
  Administered 2023-07-02 – 2023-07-03 (×3): 0.5 mg via INTRAVENOUS
  Filled 2023-06-30 (×4): qty 1

## 2023-06-30 MED ORDER — BIOTENE DRY MOUTH MT LIQD: 15.0000 mL | OROMUCOSAL | Status: DC | PRN

## 2023-06-30 MED ORDER — ACETAMINOPHEN 325 MG PO TABS: 650.0000 mg | ORAL_TABLET | Freq: Four times a day (QID) | ORAL | Status: DC | PRN

## 2023-06-30 MED ORDER — ACETAMINOPHEN 650 MG RE SUPP: 650.0000 mg | Freq: Four times a day (QID) | RECTAL | Status: DC | PRN

## 2023-06-30 MED ORDER — ONDANSETRON HCL 4 MG/2ML IJ SOLN: 4.0000 mg | Freq: Four times a day (QID) | INTRAMUSCULAR | Status: DC | PRN

## 2023-06-30 MED ORDER — LORAZEPAM 2 MG/ML PO CONC
0.5000 mg | ORAL | Status: DC | PRN
  Administered 2023-07-02: 0.5 mg via SUBLINGUAL
  Filled 2023-06-30: qty 0.25

## 2023-06-30 MED ORDER — LORAZEPAM 0.5 MG PO TABS
0.5000 mg | ORAL_TABLET | ORAL | Status: DC | PRN
  Administered 2023-07-01 – 2023-07-02 (×2): 0.5 mg via ORAL
  Filled 2023-06-30 (×2): qty 1

## 2023-06-30 MED ORDER — HYDROCOD POLI-CHLORPHE POLI ER 10-8 MG/5ML PO SUER: 5.0000 mL | Freq: Two times a day (BID) | ORAL | Status: DC | PRN

## 2023-06-30 MED ORDER — ONDANSETRON 4 MG PO TBDP: 4.0000 mg | ORAL_TABLET | Freq: Four times a day (QID) | ORAL | Status: DC | PRN

## 2023-06-30 NOTE — Assessment & Plan Note (Signed)
Patient's daughter states that patient has been rapidly declining and is now developing increasing delirium with visual and auditory hallucinations and agitation.  She states that she feels that her mother has no quality of life and would like to start focusing on that rather than quantity.  We discussed hospice +/- comfort care only; she would like to pursue comfort care only measures.  We discussed that this includes no further treatment of hypertension, diabetes, or any additional infections that arise such as pneumonia.  She is in agreement with this.  - Comfort measures only - Discontinue all telemetry monitoring and lab draws - No future use of IV antibiotics, IV fluids.  Patient's daughter does not want patient to receive any artificial nutrition - Ativan as needed for anxiety - Haldol as needed for delirium/agitation/hallucinations - Oral care - TOC consultation for hospice placement

## 2023-06-30 NOTE — Assessment & Plan Note (Signed)
Patient has had 2 hospitalizations in the past 3 months for pneumonia.  She finished her most recent course of antibiotics today.  She has continued to have cough but otherwise no shortness of breath noted.  - Tussionex as needed for cough

## 2023-06-30 NOTE — Assessment & Plan Note (Signed)
-   Discontinue home antihypertensive

## 2023-06-30 NOTE — Assessment & Plan Note (Signed)
-   Discontinue home antiglycemic agents - No SSI or CBG monitoring

## 2023-06-30 NOTE — Assessment & Plan Note (Signed)
Patient appears hypovolemic on examination consistent with poor p.o. intake and rapid weight loss.  No further monitoring of weight or volume status, unless patient should develop shortness of breath.  - Oxygen as needed for shortness of breath

## 2023-06-30 NOTE — ED Provider Notes (Signed)
[facility] Provider Note    Event Date/Time   First MD Initiated Contact with Patient 06/30/23 1026     (approximate)   History   Hallucinations   HPI  Debbie Foster is a 89 y.o. female   with advanced dementia recent diagnosis of pneumonia presents the ER for increasing reported combativeness with her elderly daughter last night.  They are unable to care for her at home.  Patient unable to provide any additional history.  Per EMS family was requesting hospice.      Physical Exam   Triage Vital Signs: ED Triage Vitals [06/30/23 1029]  Encounter Vitals Group     BP      Systolic BP Percentile      Diastolic BP Percentile      Pulse      Resp      Temp      Temp src      SpO2      Weight      Height      Head Circumference      Peak Flow      Pain Score 0     Pain Loc      Pain Education      Exclude from Growth Chart     Most recent vital signs: Vitals:   06/30/23 1300 06/30/23 1330  BP: (!) 164/73 (!) 109/95  Pulse: 73 74  Resp: (!) 23 (!) 22  Temp:    SpO2: 97% 98%     Constitutional: Alert, chronically ill and frail appearing Eyes: Conjunctivae are normal.  Head: Atraumatic. Nose: No congestion/rhinnorhea. Mouth/Throat: Mucous membranes are moist.   Neck: Painless ROM.  Cardiovascular:   Good peripheral circulation. Respiratory: Normal respiratory effort.  No retractions. Coarse bs throughout Gastrointestinal: Soft and nontender.  Musculoskeletal:  no deformity Neurologic:  MAE spontaneously. No gross focal neurologic deficits are appreciated.      ED Results / Procedures / Treatments   Labs (all labs ordered are listed, but only abnormal results are displayed) Labs Reviewed  COMPREHENSIVE METABOLIC PANEL - Abnormal; Notable for the following components:      Result Value   Sodium 130 (*)    Chloride 92 (*)    Glucose, Bld 106 (*)    Total Protein 8.5 (*)    Albumin 2.8 (*)    All other components within  normal limits  CBC WITH DIFFERENTIAL/PLATELET - Abnormal; Notable for the following components:   Hemoglobin 11.4 (*)    Platelets 581 (*)    All other components within normal limits  URINALYSIS, W/ REFLEX TO CULTURE (INFECTION SUSPECTED) - Abnormal; Notable for the following components:   Color, Urine STRAW (*)    APPearance CLEAR (*)    Protein, ur 30 (*)    All other components within normal limits  CULTURE, BLOOD (ROUTINE X 2)  CULTURE, BLOOD (ROUTINE X 2)  LACTIC ACID, PLASMA  LACTIC ACID, PLASMA     EKG  ED ECG REPORT I, Willy Eddy, the attending physician, personally viewed and interpreted this ECG.   Date: 06/30/2023  EKG Time: 10:34  Rate: 85  Rhythm: sinus  Axis: normal  Intervals: normal  ST&T Change: no stemi, no depressions    RADIOLOGY  Please see ED Course for my review and interpretation.  I personally reviewed all radiographic images ordered to evaluate for the above acute complaints and reviewed radiology reports and findings.  These findings were personally discussed with the patient.  Please see medical record for radiology report.   PROCEDURES:  Critical Care performed: No  Procedures   MEDICATIONS ORDERED IN ED: Medications  magic mouthwash (has no administration in time range)  acetaminophen (TYLENOL) tablet 650 mg (has no administration in time range)    Or  acetaminophen (TYLENOL) suppository 650 mg (has no administration in time range)  LORazepam (ATIVAN) tablet 0.5 mg (has no administration in time range)    Or  LORazepam (ATIVAN) 2 MG/ML concentrated solution 0.5 mg (has no administration in time range)    Or  LORazepam (ATIVAN) injection 0.5 mg (has no administration in time range)  haloperidol (HALDOL) tablet 0.5 mg (has no administration in time range)    Or  haloperidol (HALDOL) 2 MG/ML solution 0.5 mg (has no administration in time range)    Or  haloperidol lactate (HALDOL) injection 0.5 mg (has no administration in  time range)  senna (SENOKOT) tablet 8.6 mg (has no administration in time range)  ondansetron (ZOFRAN-ODT) disintegrating tablet 4 mg (has no administration in time range)    Or  ondansetron (ZOFRAN) injection 4 mg (has no administration in time range)  antiseptic oral rinse (BIOTENE) solution 15 mL (has no administration in time range)  chlorpheniramine-HYDROcodone (TUSSIONEX) 10-8 MG/5ML suspension 5 mL (has no administration in time range)     IMPRESSION / MDM / ASSESSMENT AND PLAN / ED COURSE  I reviewed the triage vital signs and the nursing notes.                              Differential diagnosis includes, but is not limited to, Dehydration, sepsis, pna, uti, hypoglycemia, cva, drug effect, withdrawal, encephalitis  Patient presenting to the ER for evaluation of symptoms as described above.  Based on symptoms, risk factors and considered above differential, this presenting complaint could reflect a potentially life-threatening illness therefore the patient will be placed on continuous pulse oximetry and telemetry for monitoring.  Laboratory evaluation will be sent to evaluate for the above complaints.  Chest x-ray on my review and interpretation does not show any evidence of new effusion.  Blood work is roughly at baseline.  No sepsis criteria.  Symptoms seem to be secondary to worsening delirium in the setting of dementia and recent hospitalization and pneumonia.  Will consult hospitalist.    Clinical Course as of 06/30/23 1334  Fri Jun 30, 2023  1236 I have consulted hospitalist for admission for comfort measures and placement. [PR]    Clinical Course User Index [PR] Willy Eddy, MD     FINAL CLINICAL IMPRESSION(S) / ED DIAGNOSES   Final diagnoses:  Altered mental status, unspecified altered mental status type     Rx / DC Orders   ED Discharge Orders     None        Note:  This document was prepared using Dragon voice recognition software and may include  unintentional dictation errors.    Willy Eddy, MD 06/30/23 [phone]

## 2023-06-30 NOTE — Assessment & Plan Note (Signed)
History of dementia that has progressed now with increasing delirium.  Transitioning to comfort care only.  - Haldol and Ativan as needed - Continue Seroquel at bedtime

## 2023-06-30 NOTE — TOC CM/SW Note (Addendum)
Updated by Palliaitive that patient is full comfort measures, need to determine hospice IPU versus home with hospice.  Patient was recently discharged home with [facility]. Attempted call to daughter, left VM requesting a return call.  Patient is moving to the floor. Updated inpatient RNCM and MD.   3:35- Call from patient's daughter. She is interested in [facility] in [place]. She does not feel she can meet patient's needs at home. Ree Kida with [facility] to reach out to daughter.   Alfonso Ramus, LCSW Transitions of Care Department [phone]

## 2023-06-30 NOTE — Assessment & Plan Note (Signed)
Chronic hyponatremia that is stable at this time.    - Discontinue all future monitoring.

## 2023-06-30 NOTE — Progress Notes (Signed)
[facility]- Civil engineer, contracting   HL meeting with patient's daughter tomorrow at 9:30am to talk further about the Hospice Home.    Please don't hesitate to call with any Hospice related questions or concerns.    Thank you for the opportunity to participate in this patient's care. [facility] Liaison [phone]

## 2023-06-30 NOTE — H&P (Signed)
History and Physical    Patient: Debbie Foster:[id number] DOB: 1925/08/15 DOA: 06/30/2023 DOS: the patient was seen and examined on 06/30/2023 PCP: [facility]  Patient coming from: Home  Chief Complaint:  Chief Complaint  Patient presents with   Hallucinations   HPI: Debbie Foster is a 89 y.o. female with medical history significant of advanced dementia, recent admissions for pneumonia, hypertension, hyperlipidemia, stroke, GERD, migraine, who presents to the ED due to hallucinations  History obtained from patient's daughter at bedside.  Debbie Foster states that since coming home, patient has been hallucinating more so than usual.  When hallucinating, she becomes agitated and it is very hard for Debbie Foster to calm her down.  In addition, patient has been unable to assist with any transfers or ADLs, and Debbie Foster is unable to lift her on her own.  She notes that patient has been gradually eating less and less and is losing weight.  At this point, she does not feel safe taking care of her at home and would like to pursue hospice.  They note continued cough but otherwise no other acute symptoms noted including fever, chills, shortness of breath or chest pain.  ED course: On arrival to the ED, patient was hypertensive at 156/68 with a heart rate of 65.  She is saturating 97% on room air.  She was afebrile at 97.6.  Initial workup demonstrated hemoglobin 11.4, platelets of 581, sodium of 130, creatinine 0.75 with GFR above 60.  Urinalysis with no leukocytes, nitrites or bacteria.  Chest x-ray with stable opacities consistent with prior admission for pneumonia.  Case manager was consulted for hospice.  TRH contacted for admission.  Review of Systems: As mentioned in the history of present illness. All other systems reviewed and are negative.  Past Medical History:  Diagnosis Date   Arthritis    "just about q bone in my body"   Blood dyscrasia    Bronchitis 11/2013   "first time I've ever had it;  still coughing from it now" (08/05/2014)   Diverticulitis 2004   Double vision 2012   "left eye; from the eye stroke; can see fine w/my glasses"   GERD (gastroesophageal reflux disease)    h/o uses OTC   Heart murmur    High cholesterol    Hypertension    Migraine    "stopped w/the change of life; age 34"   Squamous carcinoma    arms and face   Stroke (HCC)    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   Past Surgical History:  Procedure Laterality Date   CARPAL TUNNEL RELEASE Right    CATARACT EXTRACTION W/ INTRAOCULAR LENS IMPLANT Right 2003   CATARACT EXTRACTION W/ INTRAOCULAR LENS IMPLANT Left 2006   JOINT REPLACEMENT     REVERSE SHOULDER ARTHROPLASTY Right 08/05/2014   REVERSE SHOULDER ARTHROPLASTY Right 08/05/2014   Procedure: REVERSE SHOULDER ARTHROPLASTY;  Surgeon: Mable Paris, MD;  Location: [facility] OR;  Service: Orthopedics;  Laterality: Right;  Right reverse total shoulder arthroplasty   TONSILLECTOMY     TOTAL HIP ARTHROPLASTY Left 1998   TOTAL KNEE ARTHROPLASTY Right 2005   Social History:  reports that she has quit smoking. Her smoking use included cigarettes. She has never used smokeless tobacco. She reports that she does not drink alcohol and does not use drugs.  No Known Allergies  Family History  Problem Relation Age of Onset   Heart attack Mother    Heart attack Brother  Prior to Admission medications   Medication Sig Start Date End Date Taking? Authorizing Provider  aspirin 81 MG tablet Take 81 mg by mouth daily.    [provider]  brimonidine-timolol (COMBIGAN) 0.2-0.5 % ophthalmic solution Place 1 drop into both eyes every 12 (twelve) hours.    [provider]  cefdinir (OMNICEF) 300 MG capsule Take 1 capsule (300 mg total) by mouth daily for 3 days. 06/27/23 06/30/23  Marrion Coy, MD  Cholecalciferol (VITAMIN D3) 1000 UNITS CAPS Take 1,000 Units by mouth daily.    [provider]  citalopram (CELEXA) 10 MG tablet Take 10 mg by  mouth daily. 04/17/23   [provider]  Coenzyme Q10 100 MG capsule Take 100 mg by mouth daily.    [provider]  feeding supplement (ENSURE ENLIVE / ENSURE PLUS) LIQD Take 237 mLs by mouth 2 (two) times daily between meals. 04/06/23   Alford Highland, MD  metoprolol succinate (TOPROL-XL) 50 MG 24 hr tablet Take 25 mg by mouth daily. Take half a tablet by mouth daily    [provider]  nitroGLYCERIN (NITROSTAT) 0.4 MG SL tablet Place 0.4 mg under the tongue every 5 (five) minutes as needed.  03/02/16   [provider]  Olmesartan-amLODIPine-HCTZ 40-10-12.5 MG TABS Take 1 tablet by mouth once daily. daily 04/19/21   [provider]  prednisoLONE acetate (PRED FORTE) 1 % ophthalmic suspension Place 1 drop into the right eye every morning. 08/11/22   [provider]  QUEtiapine (SEROQUEL) 25 MG tablet Take 1 tablet (25 mg total) by mouth at bedtime. 06/27/23   Marrion Coy, MD  temazepam (RESTORIL) 7.5 MG capsule Take 7.5 mg by mouth at bedtime. 05/17/23   [provider]  Dwyane Luo 100-62.5-25 MCG/ACT AEPB Inhale 1 Inhalation into the lungs in the morning and at bedtime. 04/11/23   [provider]  trolamine salicylate (ASPERCREME) 10 % cream Apply 1 application topically as needed for muscle pain.    [provider]    Physical Exam: Vitals:   06/30/23 1100 06/30/23 1130 06/30/23 1200 06/30/23 1230  BP: (!) 156/68 (!) 156/71 (!) 140/65 (!) 155/68  Pulse: 65 67 69 68  Resp: 15 19 (!) 23 20  Temp:      TempSrc:      SpO2: 97% 95% 95% 96%  Weight:      Height:       Physical Exam Vitals and nursing note reviewed.  Constitutional:      Comments: Cachectic and chronically ill-appearing  HENT:     Head: Normocephalic and atraumatic.     Mouth/Throat:     Mouth: Mucous membranes are dry.  Pulmonary:     Effort: Pulmonary effort is normal. No respiratory distress.  Abdominal:     General: There is no  distension.     Palpations: Abdomen is soft.     Tenderness: There is no abdominal tenderness.  Musculoskeletal:     Right lower leg: No edema.     Left lower leg: No edema.  Skin:    General: Skin is warm and dry.     Findings: Bruising (In various stages of healing on bilateral legs) present.  Neurological:     Mental Status: She is alert.     Comments:  Patient is disoriented but awake and alert.  Quickly falls back asleep after talking.    Data Reviewed: CBC with WBC of 8.7, hemoglobin 11.4, platelets of 581 CMP with sodium of 130,  potassium 4.2, bicarb 30, glucose 106, creatinine 0.75, BUN 16, GFR above 60.  AST 16 and ALT 10 Lactic acid within normal limits Urinalysis with proteinuria only.  No leukocytes, nitrites, hematuria or bacteria.  EKG personally reviewed.  Sinus rhythm with frequent PACs and PVCs.  DG Chest Port 1 View  Result Date: 06/30/2023 CLINICAL DATA:  Questionable sepsis - evaluate for abnormality EXAM: PORTABLE CHEST 1 VIEW COMPARISON:  06/25/2023. FINDINGS: Redemonstration of extensive chronic interstitial changes throughout bilateral lungs without significant interval progression. There are superimposed patchy opacities overlying the left lower lung zone, right lower lung zone and overlying the lateral aspect of the right mid lung zone, which appear grossly similar to the prior study and are again concerning for multilobar pneumonia. There apparent lucency overlying the lateral aspect of the right lower lung zone, which may represent consolidation surrounding the emphysematous blebs. Correlate clinically to determine the need for additional imaging with chest CT scan. Persistent blunting of left lateral costophrenic angle, which may represent small left pleural effusion. Minimal blunting of right lateral costophrenic angle is nonspecific and may be due to pleural effusion versus superimposed lung parenchymal opacities. Stable cardio-mediastinal silhouette. No acute  osseous abnormalities. The soft tissues are within normal limits. IMPRESSION: 1. Redemonstration of extensive chronic interstitial changes throughout bilateral lungs with superimposed patchy opacities overlying the left lower lung zone, right lower lung zone and lateral aspect of the right mid lung zone, which appear grossly similar to the prior study and are again concerning for multilobar pneumonia. 2. Apparent lucency overlying the lateral aspect of the right lower lung zone, which may represent consolidation surrounding the emphysematous blebs. 3. Small left pleural effusion. Electronically Signed   By: Jules Schick M.D.   On: 06/30/2023 11:37    Results are pending, will review when available.  Assessment and Plan:  * End of life care Patient's daughter states that patient has been rapidly declining and is now developing increasing delirium with visual and auditory hallucinations and agitation.  She states that she feels that her mother has no quality of life and would like to start focusing on that rather than quantity.  We discussed hospice +/- comfort care only; she would like to pursue comfort care only measures.  We discussed that this includes no further treatment of hypertension, diabetes, or any additional infections that arise such as pneumonia.  She is in agreement with this.  - Comfort measures only - Discontinue all telemetry monitoring and lab draws - No future use of IV antibiotics, IV fluids.  Patient's daughter does not want patient to receive any artificial nutrition - Ativan as needed for anxiety - Haldol as needed for delirium/agitation/hallucinations - Oral care - TOC consultation for hospice placement  Senile dementia with delirium (HCC) History of dementia that has progressed now with increasing delirium.  Transitioning to comfort care only.  - Haldol and Ativan as needed - Continue Seroquel at bedtime  Multifocal pneumonia Patient has had 2 hospitalizations in  the past 3 months for pneumonia.  She finished her most recent course of antibiotics today.  She has continued to have cough but otherwise no shortness of breath noted.  - Tussionex as needed for cough  Controlled type 2 diabetes mellitus without complication, without long-term current use of insulin (HCC) - Discontinue home antiglycemic agents - No SSI or CBG monitoring  Hyponatremia Chronic hyponatremia that is stable at this time.    - Discontinue all future monitoring.  HTN (hypertension) - Discontinue home  antihypertensive  Chronic diastolic CHF (congestive heart failure) (HCC) Patient appears hypovolemic on examination consistent with poor p.o. intake and rapid weight loss.  No further monitoring of weight or volume status, unless patient should develop shortness of breath.  - Oxygen as needed for shortness of breath  Advance Care Planning:   Code Status: DNR/DNI - Comfort Measures Only. Please see A&P regarding end of life care.   Consults: None  Family Communication: Patient's daughter updated at bedside  Severity of Illness: The appropriate patient status for this patient is OBSERVATION. Observation status is judged to be reasonable and necessary in order to provide the required intensity of service to ensure the patient's safety. The patient's presenting symptoms, physical exam findings, and initial radiographic and laboratory data in the context of their medical condition is felt to place them at decreased risk for further clinical deterioration. Furthermore, it is anticipated that the patient will be medically stable for discharge from the hospital within 2 midnights of admission.   Author: Verdene Lennert, MD 06/30/2023 1:33 PM  For on call review [email or web address].

## 2023-06-30 NOTE — Progress Notes (Addendum)
Nutrition Brief Note  Chart reviewed. Pt now transitioning to comfort care. Plan for home with hospice vs hospice IPU.  No further nutrition interventions planned at this time.  Please re-consult as needed.   Levada Schilling, RD, LDN, CDCES Registered Dietitian II Certified Diabetes Care and Education Specialist Please refer to [facility] for RD and/or RD on-call/weekend/after hours pager

## 2023-06-30 NOTE — ED Triage Notes (Signed)
Pt here with hallucinations. Pt just finished abx for pneumonia. Pt here for hospice placement. Pt crying on arrival to ED.

## 2023-07-01 DIAGNOSIS — Z96611 Presence of right artificial shoulder joint: Secondary | ICD-10-CM | POA: Diagnosis present

## 2023-07-01 DIAGNOSIS — Z515 Encounter for palliative care: Secondary | ICD-10-CM | POA: Diagnosis present

## 2023-07-01 DIAGNOSIS — I110 Hypertensive heart disease with heart failure: Secondary | ICD-10-CM | POA: Diagnosis present

## 2023-07-01 DIAGNOSIS — M1990 Unspecified osteoarthritis, unspecified site: Secondary | ICD-10-CM | POA: Diagnosis present

## 2023-07-01 DIAGNOSIS — I5032 Chronic diastolic (congestive) heart failure: Secondary | ICD-10-CM | POA: Diagnosis present

## 2023-07-01 DIAGNOSIS — Z961 Presence of intraocular lens: Secondary | ICD-10-CM | POA: Diagnosis present

## 2023-07-01 DIAGNOSIS — K219 Gastro-esophageal reflux disease without esophagitis: Secondary | ICD-10-CM | POA: Diagnosis present

## 2023-07-01 DIAGNOSIS — F05 Delirium due to known physiological condition: Secondary | ICD-10-CM | POA: Diagnosis present

## 2023-07-01 DIAGNOSIS — Z96651 Presence of right artificial knee joint: Secondary | ICD-10-CM | POA: Diagnosis present

## 2023-07-01 DIAGNOSIS — H532 Diplopia: Secondary | ICD-10-CM | POA: Diagnosis present

## 2023-07-01 DIAGNOSIS — Z66 Do not resuscitate: Secondary | ICD-10-CM | POA: Diagnosis present

## 2023-07-01 DIAGNOSIS — Z8249 Family history of ischemic heart disease and other diseases of the circulatory system: Secondary | ICD-10-CM | POA: Diagnosis not present

## 2023-07-01 DIAGNOSIS — Z681 Body mass index (BMI) 19 or less, adult: Secondary | ICD-10-CM | POA: Diagnosis not present

## 2023-07-01 DIAGNOSIS — Z85828 Personal history of other malignant neoplasm of skin: Secondary | ICD-10-CM | POA: Diagnosis not present

## 2023-07-01 DIAGNOSIS — E7800 Pure hypercholesterolemia, unspecified: Secondary | ICD-10-CM | POA: Diagnosis present

## 2023-07-01 DIAGNOSIS — Z79899 Other long term (current) drug therapy: Secondary | ICD-10-CM | POA: Diagnosis not present

## 2023-07-01 DIAGNOSIS — Z87891 Personal history of nicotine dependence: Secondary | ICD-10-CM | POA: Diagnosis not present

## 2023-07-01 DIAGNOSIS — F0392 Unspecified dementia, unspecified severity, with psychotic disturbance: Secondary | ICD-10-CM | POA: Diagnosis present

## 2023-07-01 DIAGNOSIS — E871 Hypo-osmolality and hyponatremia: Secondary | ICD-10-CM | POA: Diagnosis present

## 2023-07-01 DIAGNOSIS — E119 Type 2 diabetes mellitus without complications: Secondary | ICD-10-CM | POA: Diagnosis present

## 2023-07-01 DIAGNOSIS — J189 Pneumonia, unspecified organism: Secondary | ICD-10-CM | POA: Diagnosis present

## 2023-07-01 DIAGNOSIS — L89151 Pressure ulcer of sacral region, stage 1: Secondary | ICD-10-CM | POA: Diagnosis present

## 2023-07-01 DIAGNOSIS — Z96642 Presence of left artificial hip joint: Secondary | ICD-10-CM | POA: Diagnosis present

## 2023-07-01 DIAGNOSIS — I69398 Other sequelae of cerebral infarction: Secondary | ICD-10-CM | POA: Diagnosis not present

## 2023-07-01 MED ORDER — METOPROLOL TARTRATE 25 MG PO TABS
12.5000 mg | ORAL_TABLET | Freq: Two times a day (BID) | ORAL | Status: DC
  Administered 2023-07-01 – 2023-07-02 (×2): 12.5 mg via ORAL
  Filled 2023-07-01 (×3): qty 1

## 2023-07-01 MED ORDER — AMLODIPINE BESYLATE 5 MG PO TABS
5.0000 mg | ORAL_TABLET | Freq: Every day | ORAL | Status: DC
  Administered 2023-07-01: 5 mg via ORAL
  Filled 2023-07-01: qty 1

## 2023-07-01 NOTE — Plan of Care (Signed)
  Problem: Fluid Volume: Goal: Hemodynamic stability will improve Outcome: Progressing   Problem: Clinical Measurements: Goal: Diagnostic test results will improve Outcome: Progressing Goal: Signs and symptoms of infection will decrease Outcome: Progressing   Problem: Respiratory: Goal: Ability to maintain adequate ventilation will improve Outcome: Progressing   Problem: Education: Goal: Knowledge of the prescribed therapeutic regimen will improve Outcome: Progressing   Problem: Coping: Goal: Ability to identify and develop effective coping behavior will improve Outcome: Progressing   Problem: Clinical Measurements: Goal: Quality of life will improve Outcome: Progressing   Problem: Respiratory: Goal: Verbalizations of increased ease of respirations will increase Outcome: Progressing   Problem: Role Relationship: Goal: Family's ability to cope with current situation will improve Outcome: Progressing Goal: Ability to verbalize concerns, feelings, and thoughts to partner or family member will improve Outcome: Progressing   Problem: Pain Management: Goal: Satisfaction with pain management regimen will improve Outcome: Progressing   Problem: Education: Goal: Knowledge of General Education information will improve Description: Including pain rating scale, medication(s)/side effects and non-pharmacologic comfort measures Outcome: Progressing   Problem: Health Behavior/Discharge Planning: Goal: Ability to manage health-related needs will improve Outcome: Progressing   Problem: Clinical Measurements: Goal: Ability to maintain clinical measurements within normal limits will improve Outcome: Progressing Goal: Will remain free from infection Outcome: Progressing Goal: Diagnostic test results will improve Outcome: Progressing Goal: Respiratory complications will improve Outcome: Progressing Goal: Cardiovascular complication will be avoided Outcome: Progressing    Problem: Activity: Goal: Risk for activity intolerance will decrease Outcome: Progressing   Problem: Nutrition: Goal: Adequate nutrition will be maintained Outcome: Progressing   Problem: Coping: Goal: Level of anxiety will decrease Outcome: Progressing   Problem: Elimination: Goal: Will not experience complications related to bowel motility Outcome: Progressing Goal: Will not experience complications related to urinary retention Outcome: Progressing   Problem: Pain Managment: Goal: General experience of comfort will improve Outcome: Progressing   Problem: Safety: Goal: Ability to remain free from injury will improve Outcome: Progressing   Problem: Skin Integrity: Goal: Risk for impaired skin integrity will decrease Outcome: Progressing   Problem: Education: Goal: Knowledge of General Education information will improve Description: Including pain rating scale, medication(s)/side effects and non-pharmacologic comfort measures Outcome: Progressing   Problem: Education: Goal: Knowledge of the prescribed therapeutic regimen will improve Outcome: Progressing   Problem: Coping: Goal: Ability to identify and develop effective coping behavior will improve Outcome: Progressing   Problem: Clinical Measurements: Goal: Quality of life will improve Outcome: Progressing   Problem: Respiratory: Goal: Verbalizations of increased ease of respirations will increase Outcome: Progressing   Problem: Role Relationship: Goal: Family's ability to cope with current situation will improve Outcome: Progressing Goal: Ability to verbalize concerns, feelings, and thoughts to partner or family member will improve Outcome: Progressing   Problem: Pain Management: Goal: Satisfaction with pain management regimen will improve Outcome: Progressing

## 2023-07-01 NOTE — Progress Notes (Addendum)
  [facility]- [employer], [employer]   Met with patient's daughters today to confirm interest and explain services. At this time, the patient is not appropriate for admission to the inpatient unit [facility]).  Daughter, Nona Dell, stated that she could not take patient home.  [facility] informed her that Hospice can follow the patient at an assisted living facility or LTC facility.  Attending, RN and Case Manager aware.    Please don't hesitate to call with any Hospice related questions or concerns.    Thank you for the opportunity to participate in this patient's care. [facility] Liaison [phone]                         Met/Spoke with patient and/or family to confirm interest and explain services.

## 2023-07-01 NOTE — Progress Notes (Signed)
[facility] Progress Note  Patient: Debbie Foster    AVW:[id number]  DOA: 06/30/2023     Date of Service: the patient was seen and examined on 07/01/2023  Chief Complaint  Patient presents with   Hallucinations   Brief hospital course:  [name] is a 89 y.o. female with medical history significant of advanced dementia, recent admissions for pneumonia, hypertension, hyperlipidemia, stroke, GERD, migraine, who presents to the ED due to hallucinations   History obtained from patient's daughter at bedside.  Debbie Foster states that since coming home, patient has been hallucinating more so than usual.  When hallucinating, she becomes agitated and it is very hard for Debbie Foster to calm her down.  In addition, patient has been unable to assist with any transfers or ADLs, and Debbie Foster is unable to lift her on her own.  She notes that patient has been gradually eating less and less and is losing weight.  At this point, she does not feel safe taking care of her at home and would like to pursue hospice.  They note continued cough but otherwise no other acute symptoms noted including fever, chills, shortness of breath or chest pain.  ED course: On arrival to the ED, patient was hypertensive at 156/68 with a heart rate of 65.  She is saturating 97% on room air.  She was afebrile at 97.6.  Initial workup demonstrated hemoglobin 11.4, platelets of 581, sodium of 130, creatinine 0.75 with GFR above 60.  Urinalysis with no leukocytes, nitrites or bacteria.  Chest x-ray with stable opacities consistent with prior admission for pneumonia.  Case manager was consulted for hospice.  [facility] contacted for admission.  Assessment and Plan: * End of life care Patient's daughter was unable to take care of her due to auditory and visual hallucination, agitation and unable to do ADLs.  Patient's daughter agreed for comfort measures only and hospice care. -Continue comfort measures only, no telemonitoring, no lab draws - No future  use of IV antibiotics, IV fluids.  Patient's daughter does not want patient to receive any artificial nutrition. - Ativan as needed for anxiety - Haldol as needed for delirium/agitation/hallucinations - Oral care - TOC consultation for hospice placement   Senile dementia with delirium  History of dementia that has progressed now with increasing delirium.  Transitioning to comfort care only. - Haldol and Ativan as needed - Continue Seroquel at bedtime   Multifocal pneumonia Patient has had 2 hospitalizations in the past 3 months for pneumonia.  She finished her most recent course of antibiotics on 06/30/23.  She has continued to have cough but otherwise no shortness of breath noted. - Tussionex as needed for cough   Controlled type 2 diabetes mellitus without complication, without long-term current use of insulin  - Discontinue home antiglycemic agents - No SSI or CBG monitoring   Hyponatremia Chronic hyponatremia that is stable at this time.   - Discontinue all future monitoring.   HTN (hypertension) - Held home medications for now Started Lopressor 12.5 mg p.o. twice daily and amlodipine 5 mg p.o. daily Monitor BP and titrate medications accordingly  Discontinue home antihypertensive   Chronic diastolic CHF (congestive heart failure)  Patient appears hypovolemic on examination consistent with poor p.o. intake and rapid weight loss.  No further monitoring of weight or volume status, unless patient should develop shortness of breath. - Oxygen as needed for shortness of breath   Body mass index is 15 kg/m.  Interventions:  Pressure Injury 06/25/23 Sacrum Mid  Stage 1 -  Intact skin with non-blanchable redness of a localized area usually over a bony prominence. skin intact, non blanchable redness (Active)  06/25/23 2042  Location: Sacrum  Location Orientation: Mid  Staging: Stage 1 -  Intact skin with non-blanchable redness of a localized area usually over a bony prominence.   Wound Description (Comments): skin intact, non blanchable redness  Present on Admission: Yes  Dressing Type Foam - Lift dressing to assess site every shift 07/01/23 1478     Diet: Regular diet DVT Prophylaxis: Comfort care  Advance goals of care discussion: DNR  Family Communication: family was not present at bedside, at the time of interview.  The pt provided permission to discuss medical plan with the family. Opportunity was given to ask question and all questions were answered satisfactorily.   Disposition:  Pt is from Home, admitted with hallucinations and agitation, transition to comfort measures and hospice care. TOC consulted for hospice placement  Subjective: No significant events overnight, patient is AO x 1, resting comfortably, denied any complaints.  Physical Exam: General: NAD, lying comfortably Appear in no distress, affect appropriate Eyes: PERRLA ENT: Oral Mucosa Clear, moist  Neck: no JVD,  Cardiovascular: S1 and S2 Present, no Murmur,  Respiratory: good respiratory effort, Bilateral Air entry equal and Decreased, no Crackles, no wheezes Abdomen: Bowel Sound present, Soft and no tenderness,  Skin: no rashes Extremities: no Pedal edema, no calf tenderness Neurologic: without any new focal findings Gait not checked due to patient safety concerns  Vitals:   06/30/23 1300 06/30/23 1330 06/30/23 1619 07/01/23 0823  BP: (!) 164/73 (!) 109/95 (!) 157/74 (!) 159/76  Pulse: 73 74 72 98  Resp: (!) 23 (!) 22 18 16   Temp:   97.9 F (36.6 C) (!) 97.4 F (36.3 C)  TempSrc:      SpO2: 97% 98% 94% 96%  Weight:      Height:        Intake/Output Summary (Last 24 hours) at 07/01/2023 1418 Last data filed at 07/01/2023 2956 Gross per 24 hour  Intake --  Output 1400 ml  Net -1400 ml   Filed Weights   06/30/23 1035  Weight: 38.4 kg    Data Reviewed: I have personally reviewed and interpreted daily labs, tele strips, imagings as discussed above. I reviewed all  nursing notes, pharmacy notes, vitals, pertinent old records I have discussed plan of care as described above with RN and patient/family.  CBC: Recent Labs  Lab 06/25/23 1726 06/26/23 0520 06/27/23 0809 06/30/23 1035  WBC 12.8* 12.8* 13.2* 8.7  NEUTROABS 9.6*  --   --  6.2  HGB 11.2* 9.5* 10.2* 11.4*  HCT 35.9* 30.3* 32.2* 36.3  MCV 83.7 84.9 83.6 83.8  PLT 584* 514* 501* 581*   Basic Metabolic Panel: Recent Labs  Lab 06/25/23 1726 06/26/23 0520 06/30/23 1035  NA 129* 132* 130*  K 4.0 3.7 4.2  CL 91* 98 92*  CO2 25 27 30   GLUCOSE 108* 107* 106*  BUN 25* 21 16  CREATININE 0.92 0.70 0.75  CALCIUM 9.8 8.8* 10.0    Studies: No results found.  Scheduled Meds: Continuous Infusions: PRN Meds: acetaminophen **OR** acetaminophen, antiseptic oral rinse, chlorpheniramine-HYDROcodone, haloperidol **OR** haloperidol **OR** haloperidol lactate, LORazepam **OR** LORazepam **OR** LORazepam, magic mouthwash, ondansetron **OR** ondansetron (ZOFRAN) IV, senna  Time spent: 35 minutes  Author: Gillis Santa. MD [facility] 07/01/2023 2:18 PM  To reach On-call, see care teams to locate the attending and reach out to  them via [email or web address]. If 7PM-7AM, please contact night-coverage If you still have difficulty reaching the attending provider, please page the [facility] (Director on Call) for [facility] on amion for assistance.

## 2023-07-01 NOTE — Plan of Care (Signed)
Problem: Fluid Volume: Goal: Hemodynamic stability will improve 07/01/2023 0434 by Smiley Houseman, LPN Outcome: Progressing 07/01/2023 0432 by Smiley Houseman, LPN Outcome: Progressing   Problem: Clinical Measurements: Goal: Diagnostic test results will improve 07/01/2023 0434 by Smiley Houseman, LPN Outcome: Progressing 07/01/2023 0432 by Smiley Houseman, LPN Outcome: Progressing Goal: Signs and symptoms of infection will decrease 07/01/2023 0434 by Smiley Houseman, LPN Outcome: Progressing 07/01/2023 0432 by Smiley Houseman, LPN Outcome: Progressing   Problem: Respiratory: Goal: Ability to maintain adequate ventilation will improve 07/01/2023 0434 by Smiley Houseman, LPN Outcome: Progressing 07/01/2023 0432 by Smiley Houseman, LPN Outcome: Progressing   Problem: Education: Goal: Knowledge of the prescribed therapeutic regimen will improve 07/01/2023 0434 by Smiley Houseman, LPN Outcome: Progressing 07/01/2023 0432 by Smiley Houseman, LPN Outcome: Progressing   Problem: Coping: Goal: Ability to identify and develop effective coping behavior will improve 07/01/2023 0434 by Smiley Houseman, LPN Outcome: Progressing 07/01/2023 0432 by Smiley Houseman, LPN Outcome: Progressing   Problem: Clinical Measurements: Goal: Quality of life will improve 07/01/2023 0434 by Smiley Houseman, LPN Outcome: Progressing 07/01/2023 0432 by Smiley Houseman, LPN Outcome: Progressing   Problem: Respiratory: Goal: Verbalizations of increased ease of respirations will increase 07/01/2023 0434 by Smiley Houseman, LPN Outcome: Progressing 07/01/2023 0432 by Smiley Houseman, LPN Outcome: Progressing   Problem: Role Relationship: Goal: Family's ability to cope with current situation will improve 07/01/2023 0434 by Smiley Houseman, LPN Outcome: Progressing 07/01/2023 0432 by Smiley Houseman, LPN Outcome: Progressing Goal: Ability to verbalize concerns, feelings, and thoughts to partner or family member will improve 07/01/2023 0434 by Smiley Houseman, LPN Outcome:  Progressing 07/01/2023 0432 by Smiley Houseman, LPN Outcome: Progressing   Problem: Pain Management: Goal: Satisfaction with pain management regimen will improve 07/01/2023 0434 by Smiley Houseman, LPN Outcome: Progressing 07/01/2023 0432 by Smiley Houseman, LPN Outcome: Progressing   Problem: Education: Goal: Knowledge of General Education information will improve Description: Including pain rating scale, medication(s)/side effects and non-pharmacologic comfort measures 07/01/2023 0434 by Smiley Houseman, LPN Outcome: Progressing 07/01/2023 0432 by Smiley Houseman, LPN Outcome: Progressing   Problem: Health Behavior/Discharge Planning: Goal: Ability to manage health-related needs will improve 07/01/2023 0434 by Smiley Houseman, LPN Outcome: Progressing 07/01/2023 0432 by Smiley Houseman, LPN Outcome: Progressing   Problem: Clinical Measurements: Goal: Ability to maintain clinical measurements within normal limits will improve 07/01/2023 0434 by Smiley Houseman, LPN Outcome: Progressing 07/01/2023 0432 by Smiley Houseman, LPN Outcome: Progressing Goal: Will remain free from infection 07/01/2023 0434 by Smiley Houseman, LPN Outcome: Progressing 07/01/2023 0432 by Smiley Houseman, LPN Outcome: Progressing Goal: Diagnostic test results will improve 07/01/2023 0434 by Smiley Houseman, LPN Outcome: Progressing 07/01/2023 0432 by Smiley Houseman, LPN Outcome: Progressing Goal: Respiratory complications will improve 07/01/2023 0434 by Smiley Houseman, LPN Outcome: Progressing 07/01/2023 0432 by Smiley Houseman, LPN Outcome: Progressing Goal: Cardiovascular complication will be avoided 07/01/2023 0434 by Smiley Houseman, LPN Outcome: Progressing 07/01/2023 0432 by Smiley Houseman, LPN Outcome: Progressing   Problem: Activity: Goal: Risk for activity intolerance will decrease 07/01/2023 0434 by Smiley Houseman, LPN Outcome: Progressing 07/01/2023 0432 by Smiley Houseman, LPN Outcome: Progressing   Problem: Nutrition: Goal: Adequate nutrition will be  maintained 07/01/2023 0434 by Smiley Houseman, LPN Outcome: Progressing 07/01/2023 0432 by Smiley Houseman, LPN Outcome: Progressing   Problem: Coping: Goal: Level of  anxiety will decrease 07/01/2023 0434 by Smiley Houseman, LPN Outcome: Progressing 07/01/2023 0432 by Smiley Houseman, LPN Outcome: Progressing   Problem: Elimination: Goal: Will not experience complications related to bowel motility 07/01/2023 0434 by Smiley Houseman, LPN Outcome: Progressing 07/01/2023 0432 by Smiley Houseman, LPN Outcome: Progressing Goal: Will not experience complications related to urinary retention 07/01/2023 0434 by Smiley Houseman, LPN Outcome: Progressing 07/01/2023 0432 by Smiley Houseman, LPN Outcome: Progressing   Problem: Pain Managment: Goal: General experience of comfort will improve 07/01/2023 0434 by Smiley Houseman, LPN Outcome: Progressing 07/01/2023 0432 by Smiley Houseman, LPN Outcome: Progressing   Problem: Safety: Goal: Ability to remain free from injury will improve 07/01/2023 0434 by Smiley Houseman, LPN Outcome: Progressing 07/01/2023 0432 by Smiley Houseman, LPN Outcome: Progressing   Problem: Skin Integrity: Goal: Risk for impaired skin integrity will decrease 07/01/2023 0434 by Smiley Houseman, LPN Outcome: Progressing 07/01/2023 0432 by Smiley Houseman, LPN Outcome: Progressing   Problem: Education: Goal: Knowledge of General Education information will improve Description: Including pain rating scale, medication(s)/side effects and non-pharmacologic comfort measures 07/01/2023 0434 by Smiley Houseman, LPN Outcome: Progressing 07/01/2023 0432 by Smiley Houseman, LPN Outcome: Progressing   Problem: Education: Goal: Knowledge of the prescribed therapeutic regimen will improve 07/01/2023 0434 by Smiley Houseman, LPN Outcome: Progressing 07/01/2023 0432 by Smiley Houseman, LPN Outcome: Progressing   Problem: Coping: Goal: Ability to identify and develop effective coping behavior will improve 07/01/2023 0434 by Smiley Houseman, LPN Outcome:  Progressing 07/01/2023 0432 by Smiley Houseman, LPN Outcome: Progressing   Problem: Clinical Measurements: Goal: Quality of life will improve 07/01/2023 0434 by Smiley Houseman, LPN Outcome: Progressing 07/01/2023 0432 by Smiley Houseman, LPN Outcome: Progressing   Problem: Respiratory: Goal: Verbalizations of increased ease of respirations will increase 07/01/2023 0434 by Smiley Houseman, LPN Outcome: Progressing 07/01/2023 0432 by Smiley Houseman, LPN Outcome: Progressing   Problem: Role Relationship: Goal: Family's ability to cope with current situation will improve 07/01/2023 0434 by Smiley Houseman, LPN Outcome: Progressing 07/01/2023 0432 by Smiley Houseman, LPN Outcome: Progressing Goal: Ability to verbalize concerns, feelings, and thoughts to partner or family member will improve 07/01/2023 0434 by Smiley Houseman, LPN Outcome: Progressing 07/01/2023 0432 by Smiley Houseman, LPN Outcome: Progressing   Problem: Pain Management: Goal: Satisfaction with pain management regimen will improve 07/01/2023 0434 by Smiley Houseman, LPN Outcome: Progressing 07/01/2023 0432 by Smiley Houseman, LPN Outcome: Progressing

## 2023-07-02 DIAGNOSIS — Z515 Encounter for palliative care: Secondary | ICD-10-CM | POA: Diagnosis not present

## 2023-07-02 NOTE — Progress Notes (Signed)
[facility] Progress Note  Patient: Debbie Foster    UEA:[id number]  DOA: 06/30/2023     Date of Service: the patient was seen and examined on 07/02/2023  Chief Complaint  Patient presents with   Hallucinations   Brief hospital course:  Debbie Foster is a 89 y.o. female with medical history significant of advanced dementia, recent admissions for pneumonia, hypertension, hyperlipidemia, stroke, GERD, migraine, who presents to the ED due to hallucinations   History obtained from patient's daughter at bedside.  Debbie Foster states that since coming home, patient has been hallucinating more so than usual.  When hallucinating, she becomes agitated and it is very hard for Debbie Foster to calm her down.  In addition, patient has been unable to assist with any transfers or ADLs, and Debbie Foster is unable to lift her on her own.  She notes that patient has been gradually eating less and less and is losing weight.  At this point, she does not feel safe taking care of her at home and would like to pursue hospice.  They note continued cough but otherwise no other acute symptoms noted including fever, chills, shortness of breath or chest pain.  ED course: On arrival to the ED, patient was hypertensive at 156/68 with a heart rate of 65.  She is saturating 97% on room air.  She was afebrile at 97.6.  Initial workup demonstrated hemoglobin 11.4, platelets of 581, sodium of 130, creatinine 0.75 with GFR above 60.  Urinalysis with no leukocytes, nitrites or bacteria.  Chest x-ray with stable opacities consistent with prior admission for pneumonia.  Case manager was consulted for hospice.  [facility] contacted for admission.  Assessment and Plan: * End of life care Patient's daughter was unable to take care of her due to auditory and visual hallucination, agitation and unable to do ADLs.  Patient's daughter agreed for comfort measures only and hospice care. -Continue comfort measures only, no telemonitoring, no lab draws - No future  use of IV antibiotics, IV fluids.  Patient's daughter does not want patient to receive any artificial nutrition. - Ativan as needed for anxiety - Haldol as needed for delirium/agitation/hallucinations - Oral care - TOC consultation for hospice placement   Senile dementia with delirium  History of dementia that has progressed now with increasing delirium.  Transitioning to comfort care only. - Haldol and Ativan as needed - Continue Seroquel at bedtime   Multifocal pneumonia Patient has had 2 hospitalizations in the past 3 months for pneumonia.  She finished her most recent course of antibiotics on 06/30/23.  She has continued to have cough but otherwise no shortness of breath noted. - Tussionex as needed for cough   Controlled type 2 diabetes mellitus without complication, without long-term current use of insulin  - Discontinue home antiglycemic agents - No SSI or CBG monitoring   Hyponatremia Chronic hyponatremia that is stable at this time.   - Discontinue all future monitoring.   HTN (hypertension) - Held home medications for now 8/3 started Lopressor 12.5 mg p.o. twice daily and amlodipine 5 mg p.o. daily Monitor BP and titrate medications accordingly  Discontinue home antihypertensive   Chronic diastolic CHF (congestive heart failure)  Patient appears hypovolemic on examination consistent with poor p.o. intake and rapid weight loss.  No further monitoring of weight or volume status, unless patient should develop shortness of breath. - Oxygen as needed for shortness of breath   Body mass index is 15 kg/m.  Interventions:  Pressure Injury 06/25/23 Sacrum  Mid Stage 1 -  Intact skin with non-blanchable redness of a localized area usually over a bony prominence. skin intact, non blanchable redness (Active)  06/25/23 2042  Location: Sacrum  Location Orientation: Mid  Staging: Stage 1 -  Intact skin with non-blanchable redness of a localized area usually over a bony prominence.   Wound Description (Comments): skin intact, non blanchable redness  Present on Admission: Yes  Dressing Type Foam - Lift dressing to assess site every shift 07/01/23 2004     Diet: Regular diet DVT Prophylaxis: Comfort care  Advance goals of care discussion: DNR  Family Communication: family was not present at bedside, at the time of interview.  The pt provided permission to discuss medical plan with the family. Opportunity was given to ask question and all questions were answered satisfactorily.   Disposition:  Pt is from Home, admitted with hallucinations and agitation, transition to comfort measures and hospice care. TOC consulted for hospice placement  Subjective: No significant events overnight, patient was sleepy, laying comfortably without any acute distress.  Unable to offer any complaints.   Physical Exam: General: NAD, lying comfortably Appear in no distress, affect appropriate Eyes: closed, sleepy  ENT: Oral Mucosa Clear, moist  Neck: no JVD,  Cardiovascular: S1 and S2 Present, no Murmur,  Respiratory: good respiratory effort, Bilateral Air entry equal and Decreased, no Crackles, no wheezes Abdomen: Bowel Sound present, Soft and no tenderness,  Skin: no rashes Extremities: no Pedal edema, no calf tenderness Neurologic: without any new focal findings Gait not checked due to patient safety concerns  Vitals:   06/30/23 1330 06/30/23 1619 07/01/23 0823 07/01/23 1742  BP: (!) 109/95 (!) 157/74 (!) 159/76 (!) 159/76  Pulse: 74 72 98 (!) 107  Resp: (!) 22 18 16    Temp:  97.9 F (36.6 C) (!) 97.4 F (36.3 C)   TempSrc:      SpO2: 98% 94% 96% 93%  Weight:      Height:        Intake/Output Summary (Last 24 hours) at 07/02/2023 1300 Last data filed at 07/01/2023 2004 Gross per 24 hour  Intake --  Output 100 ml  Net -100 ml   Filed Weights   06/30/23 1035  Weight: 38.4 kg    Data Reviewed: I have personally reviewed and interpreted daily labs, tele strips,  imagings as discussed above. I reviewed all nursing notes, pharmacy notes, vitals, pertinent old records I have discussed plan of care as described above with RN and patient/family.  CBC: Recent Labs  Lab 06/25/23 1726 06/26/23 0520 06/27/23 0809 06/30/23 1035  WBC 12.8* 12.8* 13.2* 8.7  NEUTROABS 9.6*  --   --  6.2  HGB 11.2* 9.5* 10.2* 11.4*  HCT 35.9* 30.3* 32.2* 36.3  MCV 83.7 84.9 83.6 83.8  PLT 584* 514* 501* 581*   Basic Metabolic Panel: Recent Labs  Lab 06/25/23 1726 06/26/23 0520 06/30/23 1035  NA 129* 132* 130*  K 4.0 3.7 4.2  CL 91* 98 92*  CO2 25 27 30   GLUCOSE 108* 107* 106*  BUN 25* 21 16  CREATININE 0.92 0.70 0.75  CALCIUM 9.8 8.8* 10.0    Studies: No results found.  Scheduled Meds:  amLODipine  5 mg Oral Daily   metoprolol tartrate  12.5 mg Oral BID   Continuous Infusions: PRN Meds: acetaminophen **OR** acetaminophen, antiseptic oral rinse, chlorpheniramine-HYDROcodone, haloperidol **OR** haloperidol **OR** haloperidol lactate, LORazepam **OR** LORazepam **OR** LORazepam, magic mouthwash, ondansetron **OR** ondansetron (ZOFRAN) IV, senna  Time spent:  35 minutes  Author: Gillis Santa. MD [facility] 07/02/2023 1:00 PM  To reach On-call, see care teams to locate the attending and reach out to them via [email or web address]. If 7PM-7AM, please contact night-coverage If you still have difficulty reaching the attending provider, please page the [facility] (Director on Call) for [facility] on amion for assistance.

## 2023-07-02 NOTE — Progress Notes (Addendum)
[facility] Liaison Note:    Hospice will follow peripherally until a discharge disposition has been determined.    Please  call with any Hospice related questions or concerns.    Thank you for the opportunity to participate in this patient's care.  [facility] Liaison [phone]

## 2023-07-03 DIAGNOSIS — Z515 Encounter for palliative care: Secondary | ICD-10-CM | POA: Diagnosis not present

## 2023-07-03 MED ORDER — MORPHINE SULFATE (PF) 2 MG/ML IV SOLN
2.0000 mg | INTRAVENOUS | Status: DC | PRN
  Administered 2023-07-03: 2 mg via INTRAVENOUS
  Filled 2023-07-03: qty 1

## 2023-07-03 NOTE — Progress Notes (Signed)
Patient is transferring to [facility]. Report called in Nurse [facility]. Requiting to leave IV for access.

## 2023-07-03 NOTE — Plan of Care (Signed)
  Problem: Fluid Volume: Goal: Hemodynamic stability will improve Outcome: Not Progressing   Problem: Clinical Measurements: Goal: Diagnostic test results will improve Outcome: Not Progressing Goal: Signs and symptoms of infection will decrease Outcome: Not Progressing   Problem: Respiratory: Goal: Ability to maintain adequate ventilation will improve Outcome: Not Progressing   Problem: Education: Goal: Knowledge of the prescribed therapeutic regimen will improve Outcome: Not Progressing   Problem: Coping: Goal: Ability to identify and develop effective coping behavior will improve Outcome: Not Progressing   Problem: Clinical Measurements: Goal: Quality of life will improve Outcome: Not Progressing   Problem: Respiratory: Goal: Verbalizations of increased ease of respirations will increase Outcome: Not Progressing   Problem: Role Relationship: Goal: Family's ability to cope with current situation will improve Outcome: Not Progressing Goal: Ability to verbalize concerns, feelings, and thoughts to partner or family member will improve Outcome: Not Progressing   Problem: Pain Management: Goal: Satisfaction with pain management regimen will improve Outcome: Not Progressing   Problem: Education: Goal: Knowledge of General Education information will improve Description: Including pain rating scale, medication(s)/side effects and non-pharmacologic comfort measures Outcome: Not Progressing   Problem: Health Behavior/Discharge Planning: Goal: Ability to manage health-related needs will improve Outcome: Not Progressing   Problem: Clinical Measurements: Goal: Ability to maintain clinical measurements within normal limits will improve Outcome: Not Progressing Goal: Will remain free from infection Outcome: Not Progressing Goal: Diagnostic test results will improve Outcome: Not Progressing Goal: Respiratory complications will improve Outcome: Not Progressing Goal:  Cardiovascular complication will be avoided Outcome: Not Progressing   Problem: Activity: Goal: Risk for activity intolerance will decrease Outcome: Not Progressing   Problem: Nutrition: Goal: Adequate nutrition will be maintained Outcome: Not Progressing   Problem: Coping: Goal: Level of anxiety will decrease Outcome: Not Progressing   Problem: Elimination: Goal: Will not experience complications related to bowel motility Outcome: Not Progressing Goal: Will not experience complications related to urinary retention Outcome: Not Progressing   Problem: Pain Managment: Goal: General experience of comfort will improve Outcome: Not Progressing   Problem: Safety: Goal: Ability to remain free from injury will improve Outcome: Not Progressing   Problem: Skin Integrity: Goal: Risk for impaired skin integrity will decrease Outcome: Not Progressing   Problem: Education: Goal: Knowledge of General Education information will improve Description: Including pain rating scale, medication(s)/side effects and non-pharmacologic comfort measures Outcome: Not Progressing   Problem: Education: Goal: Knowledge of the prescribed therapeutic regimen will improve Outcome: Not Progressing   Problem: Coping: Goal: Ability to identify and develop effective coping behavior will improve Outcome: Not Progressing   Problem: Clinical Measurements: Goal: Quality of life will improve Outcome: Not Progressing   Problem: Respiratory: Goal: Verbalizations of increased ease of respirations will increase Outcome: Not Progressing   Problem: Role Relationship: Goal: Family's ability to cope with current situation will improve Outcome: Not Progressing Goal: Ability to verbalize concerns, feelings, and thoughts to partner or family member will improve Outcome: Not Progressing   Problem: Pain Management: Goal: Satisfaction with pain management regimen will improve Outcome: Not Progressing

## 2023-07-03 NOTE — Plan of Care (Signed)
  Problem: Role Relationship: Goal: Family's ability to cope with current situation will improve Outcome: Progressing   Problem: Pain Management: Goal: Satisfaction with pain management regimen will improve Outcome: Progressing   Problem: Clinical Measurements: Goal: Respiratory complications will improve Outcome: Progressing   Problem: Coping: Goal: Level of anxiety will decrease Outcome: Progressing   Problem: Clinical Measurements: Goal: Quality of life will improve Outcome: Progressing

## 2023-07-03 NOTE — Progress Notes (Signed)
Magazine features editor Hospice Unit [facility]) Referral Note  Received request from Jonetta Speak, SW,Transitions of Care Manager, for I[facility]. Spoke with Mindi Junker and Dondra Spry, patient's daughters to revisit education related to hospice philosophy, services, and team approach to care. Patient/family verbalized understanding of information given. Patient/family meet with hospital liaison 3 days ago and patient was not appropriate for IPU.  Patient has had a decline and is requiring symptom management of pain, restlessness and agitation.  Per Dr. Alphonsus Sias, hospice physician, patient is appropriate for [facility] for symptom management.  Mindi Junker, patient's daughter and POA will sign consents at the Hospice Home.    Please send signed and completed DNR with patient at discharge.    [facility] information and contact numbers given to [facility].   Jonetta Speak and hospital staff updated on discharge plan and acceptance of patient to [facility].  RN to call report to the hospice home at [phone]. This RN will initiate EMS transport when consents are signed by daughter, and hospital team is ready for hospital discharge.    Please call with any hospice related questions or concerns. Thank you for the opportunity to participate in this patient's care.  Norris Cross, RN Nurse Liaison [phone]

## 2023-07-03 NOTE — TOC Transition Note (Signed)
Transition of Care [facility]) - CM/SW Discharge Note   Patient Details  Name: Debbie Foster MRN: [id number] Date of Birth: 10-02-1925  Transition [facility] Care [facility]) CM/SW Contact:  Garret Reddish, RN Phone Number: 07/03/2023, 2:24 PM   Clinical Narrative:     Informed by Diannia Ruder with [facility] that patient has been approved for [facility].  Diannia Ruder reports that daughter will need to sign consents and then she will arrange EMS transport for transfer to the [facility].    I have informed staff nurse of the above information.     Final next level of care: Hospice Medical Facility [facility]) Barriers to Discharge: No Barriers Identified   Patient Goals and CMS Choice CMS Medicare.gov Compare Post Acute Care list provided to:: Patient Represenative (must comment) (Patient's daughter)    Discharge Placement                  Patient to be transferred to facility by: Ambulance transport will be arranged by [facility]. Name of family member notified: Patient's daughter Mrs. Jeannette Corpus Patient and family notified of of transfer: 07/03/23  Discharge Plan and Services Additional resources added to the After Visit Summary for                                       Social Determinants of Health (SDOH) Interventions SDOH Screenings   Food Insecurity: No Food Insecurity (06/30/2023)  Housing: Low Risk  (06/30/2023)  Transportation Needs: No Transportation Needs (06/30/2023)  Utilities: Not At Risk (06/30/2023)  Tobacco Use: Medium Risk (06/30/2023)     Readmission Risk Interventions     No data to display

## 2023-07-03 NOTE — Discharge Summary (Signed)
[facility] Discharge Summary   Patient: Debbie Foster JJO:[id number]  PCP: [facility]  Date of admission: 06/30/2023   Date of discharge:  07/03/2023     Discharge Diagnoses:  Principal Problem:   End of life care Active Problems:   Senile dementia with delirium (HCC)   Multifocal pneumonia   Chronic diastolic CHF (congestive heart failure) (HCC)   HTN (hypertension)   Hyponatremia   Controlled type 2 diabetes mellitus without complication, without long-term current use of insulin (HCC)   Admitted From: Home Disposition: Hospice facility  Recommendations for Outpatient Follow-up:  Hospice care Follow up LABS/TEST:  None   Diet recommendation: Regular diet if able to eat  Activity: The patient is advised to gradually reintroduce usual activities, as tolerated  Discharge Condition: stable  Code Status: DNR   History of present illness: As per the H and P dictated on admission Hospital Course:  Debbie Foster is a 89 y.o. female with medical history significant of advanced dementia, recent admissions for pneumonia, hypertension, hyperlipidemia, stroke, GERD, migraine, who presents to the ED due to hallucinations History obtained from patient's daughter at bedside.  Debbie Foster states that since coming home, patient has been hallucinating more so than usual.  When hallucinating, she becomes agitated and it is very hard for Debbie Foster to calm her down.  In addition, patient has been unable to assist with any transfers or ADLs, and Debbie Foster is unable to lift her on her own.  She notes that patient has been gradually eating less and less and is losing weight.  At this point, she does not feel safe taking care of her at home and would like to pursue hospice.  They note continued cough but otherwise no other acute symptoms noted including fever, chills, shortness of breath or chest pain.   ED course: Hypertensive at 156/68 and HR 65.  O2 sat 97% on RA. afebrile Temp 97.6.  Hb 11.4,  plts 581, Na 130, Crt 0.75 with GFR > 60.  Urinalysis with no leukocytes, nitrites or bacteria.  Chest x-ray with stable opacities consistent with prior admission for pneumonia.  Case manager was consulted for hospice.  [facility] contacted for admission.   Assessment and Plan: # End of life care Patient's daughter was unable to take care of her due to auditory and visual hallucination, agitation and unable to do ADLs.  Patient's daughter agreed for comfort measures only and hospice care. Continue comfort measures only, no telemonitoring, no lab draws No future use of IV antibiotics, IV fluids.  Patient's daughter does not want patient to receive any artificial nutrition. S/p Ativan as needed for anxiety and s/p Haldol as needed for delirium/agitation/hallucinations. Oral care. TOC consultation for hospice placement # Senile dementia with delirium: History of dementia that has progressed now with increasing delirium.  Transitioning to comfort care only. Haldol and Ativan as needed, s/p Seroquel qhs # Multifocal pneumonia: Patient has had 2 hospitalizations in the past 3 months for pneumonia.  She finished her most recent course of antibiotics on 06/30/23.  She has continued to have cough but otherwise no shortness of breath noted. S/p Tussionex as needed for cough # Controlled type 2 diabetes mellitus without complication, without long-term current use of insulin. - Discontinue home antiglycemic agents. No SSI or CBG monitoring # Hyponatremia, Chronic hyponatremia that is stable at this time. Discontinued all future monitoring. # HTN (hypertension), discontinued home meds, continue comfort measures only. # Chronic diastolic CHF (congestive heart failure) Patient appears hypovolemic  on examination consistent with poor p.o. intake and rapid weight loss.  No further monitoring of weight or volume status, unless patient should develop shortness of breath. Oxygen as needed for shortness of breath   Body mass index  is 15 kg/m.  Nutrition Interventions:  Pressure Injury 06/25/23 Sacrum Mid Stage 1 -  Intact skin with non-blanchable redness of a localized area usually over a bony prominence. skin intact, non blanchable redness (Active)  06/25/23 2042  Location: Sacrum  Location Orientation: Mid  Staging: Stage 1 -  Intact skin with non-blanchable redness of a localized area usually over a bony prominence.  Wound Description (Comments): skin intact, non blanchable redness  Present on Admission: Yes  Dressing Type Foam - Lift dressing to assess site every shift 07/03/23 1000     Overall prognosis very poor, patient was appropriate for hospice care.  TOC consulted, patient qualified for inpatient hospice care, and bed is available.  Patient is appropriate to discharge under hospice service.  Consultants: Palliative care and hospice care Procedures: None  Discharge Exam: General: Appear in no distress, no Rash; Oral Mucosa Clear, and dry Cardiovascular: S1 and S2 Present, no Murmur, Respiratory: normal respiratory effort, Bilateral Air entry present and no Crackles, no wheezes Abdomen: Bowel Sound present, Soft and no tenderness, no hernia Extremities: no Pedal edema, no calf tenderness Neurology: No focal deficits, patient was sleeping comfortably, unable to follow any commands  Filed Weights   06/30/23 1035  Weight: 38.4 kg   Vitals:   07/02/23 2209 07/03/23 0722  BP: 139/68 (!) 157/75  Pulse: 84 98  Resp: 20 20  Temp:    SpO2: 95% 95%    DISCHARGE MEDICATION: Allergies as of 07/03/2023   No Known Allergies      Medication List     STOP taking these medications    aspirin 81 MG tablet   cefdinir 300 MG capsule Commonly known as: OMNICEF   citalopram 10 MG tablet Commonly known as: CELEXA   Coenzyme Q10 100 MG capsule   Combigan 0.2-0.5 % ophthalmic solution Generic drug: brimonidine-timolol   feeding supplement Liqd   metoprolol succinate 50 MG 24 hr tablet Commonly  known as: TOPROL-XL   nitroGLYCERIN 0.4 MG SL tablet Commonly known as: NITROSTAT   Olmesartan-amLODIPine-HCTZ 40-10-12.5 MG Tabs   prednisoLONE acetate 1 % ophthalmic suspension Commonly known as: PRED FORTE   QUEtiapine 25 MG tablet Commonly known as: SEROQUEL   temazepam 7.5 MG capsule Commonly known as: RESTORIL   Trelegy Ellipta 100-62.5-25 MCG/ACT Aepb Generic drug: Fluticasone-Umeclidin-Vilant   trolamine salicylate 10 % cream Commonly known as: ASPERCREME   Vitamin D3 25 MCG (1000 UT) Caps               Discharge Care Instructions  (From admission, onward)           Start     Ordered   07/03/23 0000  Discharge wound care:       Comments: As above   07/03/23 1641           No Known Allergies Discharge Instructions     Diet general   Complete by: As directed    Discharge instructions   Complete by: As directed    Discharge wound care:   Complete by: As directed    As above   Increase activity slowly   Complete by: As directed        The results of significant diagnostics from this hospitalization (including imaging, microbiology, ancillary  and laboratory) are listed below for reference.    Significant Diagnostic Studies: DG Chest Port 1 View  Result Date: 06/30/2023 CLINICAL DATA:  Questionable sepsis - evaluate for abnormality EXAM: PORTABLE CHEST 1 VIEW COMPARISON:  06/25/2023. FINDINGS: Redemonstration of extensive chronic interstitial changes throughout bilateral lungs without significant interval progression. There are superimposed patchy opacities overlying the left lower lung zone, right lower lung zone and overlying the lateral aspect of the right mid lung zone, which appear grossly similar to the prior study and are again concerning for multilobar pneumonia. There apparent lucency overlying the lateral aspect of the right lower lung zone, which may represent consolidation surrounding the emphysematous blebs. Correlate clinically to  determine the need for additional imaging with chest CT scan. Persistent blunting of left lateral costophrenic angle, which may represent small left pleural effusion. Minimal blunting of right lateral costophrenic angle is nonspecific and may be due to pleural effusion versus superimposed lung parenchymal opacities. Stable cardio-mediastinal silhouette. No acute osseous abnormalities. The soft tissues are within normal limits. IMPRESSION: 1. Redemonstration of extensive chronic interstitial changes throughout bilateral lungs with superimposed patchy opacities overlying the left lower lung zone, right lower lung zone and lateral aspect of the right mid lung zone, which appear grossly similar to the prior study and are again concerning for multilobar pneumonia. 2. Apparent lucency overlying the lateral aspect of the right lower lung zone, which may represent consolidation surrounding the emphysematous blebs. 3. Small left pleural effusion. Electronically Signed   By: Jules Schick M.D.   On: 06/30/2023 11:37   CT Head Wo Contrast  Result Date: 06/25/2023 CLINICAL DATA:  Altered mental status EXAM: CT HEAD WITHOUT CONTRAST TECHNIQUE: Contiguous axial images were obtained from the base of the skull through the vertex without intravenous contrast. RADIATION DOSE REDUCTION: This exam was performed according to the departmental dose-optimization program which includes automated exposure control, adjustment of the mA and/or kV according to patient size and/or use of iterative reconstruction technique. COMPARISON:  CT brain 07/27/2022 FINDINGS: Brain: No acute territorial infarction, hemorrhage or intracranial mass. Mild atrophy. Patchy white matter hypodensity consistent with chronic small vessel ischemic change. Stable ventricle size Vascular: No hyperdense vessels. Vertebral and carotid vascular calcification Skull: Normal. Negative for fracture or focal lesion. Sinuses/Orbits: No acute finding. Other: None  IMPRESSION: 1. No CT evidence for acute intracranial abnormality. 2. Atrophy and chronic small vessel ischemic changes of the white matter. Electronically Signed   By: Jasmine Pang M.D.   On: 06/25/2023 18:08   DG Chest Port 1 View  Result Date: 06/25/2023 CLINICAL DATA:  Possible sepsis EXAM: PORTABLE CHEST 1 VIEW COMPARISON:  04/04/2023, chest CT 04/25/2023 FINDINGS: Underlying chronic lung disease with bronchiectasis and fibrosis. Patchy bilateral airspace opacities concerning for acute superimposed airspace disease. Stable cardiomediastinal silhouette. No pneumothorax. Incompletely visualized right shoulder replacement. IMPRESSION: Underlying chronic lung disease with bronchiectasis and fibrosis. Patchy bilateral airspace opacities concerning for acute superimposed airspace disease/pneumonia. Electronically Signed   By: Jasmine Pang M.D.   On: 06/25/2023 17:42    Microbiology: Recent Results (from the past 240 hour(s))  Blood Culture (routine x 2)     Status: None   Collection Time: 06/25/23  6:36 PM   Specimen: BLOOD  Result Value Ref Range Status   Specimen Description BLOOD BLOOD LEFT ARM  Final   Special Requests   Final    BOTTLES DRAWN AEROBIC ONLY Blood Culture results may not be optimal due to an inadequate volume of blood received in  culture bottles   Culture   Final    NO GROWTH 5 DAYS Performed at [facility], [address] [place], [place] [address]    Report Status 06/30/2023 FINAL  Final  Blood Culture (routine x 2)     Status: None   Collection Time: 06/25/23  6:36 PM   Specimen: BLOOD  Result Value Ref Range Status   Specimen Description BLOOD BLOOD RIGHT ARM  Final   Special Requests   Final    BOTTLES DRAWN AEROBIC AND ANAEROBIC Blood Culture adequate volume   Culture   Final    NO GROWTH 5 DAYS Performed at [facility], [address], [place], [place] [address]    Report Status 06/30/2023 FINAL  Final  Blood Culture (routine x 2)      Status: None (Preliminary result)   Collection Time: 06/30/23 10:43 AM   Specimen: BLOOD  Result Value Ref Range Status   Specimen Description BLOOD RIGHT [facility]  Final   Special Requests   Final    BOTTLES DRAWN AEROBIC AND ANAEROBIC Blood Culture adequate volume   Culture   Final    NO GROWTH 3 DAYS Performed at [facility], [address], [place], [place] [address]    Report Status PENDING  Incomplete  Blood Culture (routine x 2)     Status: None (Preliminary result)   Collection Time: 06/30/23 10:44 AM   Specimen: BLOOD  Result Value Ref Range Status   Specimen Description BLOOD LEFT AC  Final   Special Requests   Final    BOTTLES DRAWN AEROBIC AND ANAEROBIC Blood Culture results may not be optimal due to an excessive volume of blood received in culture bottles   Culture   Final    NO GROWTH 3 DAYS Performed at [facility], [address] [place], [place] [address]    Report Status PENDING  Incomplete     Labs: CBC: Recent Labs  Lab 06/27/23 0809 06/30/23 1035  WBC 13.2* 8.7  NEUTROABS  --  6.2  HGB 10.2* 11.4*  HCT 32.2* 36.3  MCV 83.6 83.8  PLT 501* 581*   Basic Metabolic Panel: Recent Labs  Lab 06/30/23 1035  NA 130*  K 4.2  CL 92*  CO2 30  GLUCOSE 106*  BUN 16  CREATININE 0.75  CALCIUM 10.0   Liver Function Tests: Recent Labs  Lab 06/30/23 1035  AST 16  ALT 10  ALKPHOS 55  BILITOT 0.6  PROT 8.5*  ALBUMIN 2.8*   No results for input(s): "LIPASE", "AMYLASE" in the last 168 hours. No results for input(s): "AMMONIA" in the last 168 hours. Cardiac Enzymes: No results for input(s): "CKTOTAL", "CKMB", "CKMBINDEX", "TROPONINI" in the last 168 hours. BNP (last 3 results) Recent Labs    07/28/22 2303 04/04/23 1056  BNP 126.4* 129.4*   CBG: No results for input(s): "GLUCAP" in the last 168 hours.  Time spent: 35 minutes  Signed:  Gillis Santa  [facility]  07/03/2023 4:42 PM

## 2023-07-30 DEATH — deceased

## 2023-08-14 ENCOUNTER — Ambulatory Visit: Payer: Medicare Other | Admitting: Internal Medicine
